# Patient Record
Sex: Female | Born: 1950 | Race: Black or African American | Hispanic: No | Smoking: Never smoker
Health system: Southern US, Community
[De-identification: ages and names within clinical notes are randomized; demographics above are authoritative.]

## PROBLEM LIST (undated history)

## (undated) DIAGNOSIS — R200 Anesthesia of skin: Secondary | ICD-10-CM

## (undated) DIAGNOSIS — Z862 Personal history of diseases of the blood and blood-forming organs and certain disorders involving the immune mechanism: Secondary | ICD-10-CM

## (undated) DIAGNOSIS — J45909 Unspecified asthma, uncomplicated: Secondary | ICD-10-CM

## (undated) DIAGNOSIS — I10 Essential (primary) hypertension: Secondary | ICD-10-CM

## (undated) DIAGNOSIS — E7800 Pure hypercholesterolemia, unspecified: Secondary | ICD-10-CM

## (undated) DIAGNOSIS — K219 Gastro-esophageal reflux disease without esophagitis: Secondary | ICD-10-CM

## (undated) DIAGNOSIS — M1990 Unspecified osteoarthritis, unspecified site: Secondary | ICD-10-CM

## (undated) DIAGNOSIS — J449 Chronic obstructive pulmonary disease, unspecified: Secondary | ICD-10-CM

## (undated) DIAGNOSIS — B181 Chronic viral hepatitis B without delta-agent: Secondary | ICD-10-CM

## (undated) HISTORY — PX: BACK SURGERY: SHX140

## (undated) HISTORY — PX: BILATERAL CARPAL TUNNEL RELEASE: SHX6508

## (undated) HISTORY — PX: KNEE SURGERY: SHX244

## (undated) HISTORY — PX: CERVICAL SPINE SURGERY: SHX589

---

## 1998-04-17 ENCOUNTER — Emergency Department (HOSPITAL_COMMUNITY): Admission: EM | Admit: 1998-04-17 | Discharge: 1998-04-17 | Payer: Self-pay | Admitting: Emergency Medicine

## 1998-05-02 ENCOUNTER — Emergency Department (HOSPITAL_COMMUNITY): Admission: EM | Admit: 1998-05-02 | Discharge: 1998-05-02 | Payer: Self-pay | Admitting: Emergency Medicine

## 1998-06-09 ENCOUNTER — Observation Stay (HOSPITAL_COMMUNITY): Admission: RE | Admit: 1998-06-09 | Discharge: 1998-06-10 | Payer: Self-pay | Admitting: Neurosurgery

## 1998-07-10 ENCOUNTER — Ambulatory Visit (HOSPITAL_COMMUNITY): Admission: RE | Admit: 1998-07-10 | Discharge: 1998-07-10 | Payer: Self-pay | Admitting: Gastroenterology

## 1998-12-10 ENCOUNTER — Encounter: Payer: Self-pay | Admitting: Emergency Medicine

## 1998-12-10 ENCOUNTER — Emergency Department (HOSPITAL_COMMUNITY): Admission: EM | Admit: 1998-12-10 | Discharge: 1998-12-10 | Payer: Self-pay | Admitting: Emergency Medicine

## 1998-12-14 ENCOUNTER — Emergency Department (HOSPITAL_COMMUNITY): Admission: EM | Admit: 1998-12-14 | Discharge: 1998-12-14 | Payer: Self-pay | Admitting: Emergency Medicine

## 1999-10-16 ENCOUNTER — Inpatient Hospital Stay (HOSPITAL_COMMUNITY): Admission: EM | Admit: 1999-10-16 | Discharge: 1999-10-19 | Payer: Self-pay | Admitting: Emergency Medicine

## 1999-10-16 ENCOUNTER — Encounter: Payer: Self-pay | Admitting: Emergency Medicine

## 1999-10-18 ENCOUNTER — Encounter: Payer: Self-pay | Admitting: Internal Medicine

## 1999-11-03 ENCOUNTER — Encounter: Admission: RE | Admit: 1999-11-03 | Discharge: 1999-11-03 | Payer: Self-pay | Admitting: Internal Medicine

## 2001-06-07 ENCOUNTER — Emergency Department (HOSPITAL_COMMUNITY): Admission: EM | Admit: 2001-06-07 | Discharge: 2001-06-07 | Payer: Self-pay | Admitting: Emergency Medicine

## 2001-07-06 ENCOUNTER — Emergency Department (HOSPITAL_COMMUNITY): Admission: EM | Admit: 2001-07-06 | Discharge: 2001-07-06 | Payer: Self-pay | Admitting: Emergency Medicine

## 2001-07-06 ENCOUNTER — Encounter: Payer: Self-pay | Admitting: Emergency Medicine

## 2001-07-27 ENCOUNTER — Encounter: Payer: Self-pay | Admitting: Neurosurgery

## 2001-07-31 ENCOUNTER — Ambulatory Visit (HOSPITAL_COMMUNITY): Admission: RE | Admit: 2001-07-31 | Discharge: 2001-08-01 | Payer: Self-pay | Admitting: Neurosurgery

## 2001-07-31 ENCOUNTER — Encounter: Payer: Self-pay | Admitting: Neurosurgery

## 2001-09-25 ENCOUNTER — Encounter: Payer: Self-pay | Admitting: Neurosurgery

## 2001-09-25 ENCOUNTER — Ambulatory Visit (HOSPITAL_COMMUNITY): Admission: RE | Admit: 2001-09-25 | Discharge: 2001-09-25 | Payer: Self-pay | Admitting: Neurosurgery

## 2003-12-08 ENCOUNTER — Emergency Department (HOSPITAL_COMMUNITY): Admission: EM | Admit: 2003-12-08 | Discharge: 2003-12-08 | Payer: Self-pay | Admitting: Emergency Medicine

## 2004-05-27 ENCOUNTER — Emergency Department (HOSPITAL_COMMUNITY): Admission: EM | Admit: 2004-05-27 | Discharge: 2004-05-27 | Payer: Self-pay | Admitting: Emergency Medicine

## 2005-09-29 ENCOUNTER — Emergency Department (HOSPITAL_COMMUNITY): Admission: EM | Admit: 2005-09-29 | Discharge: 2005-09-29 | Payer: Self-pay | Admitting: Emergency Medicine

## 2008-01-30 ENCOUNTER — Emergency Department (HOSPITAL_COMMUNITY): Admission: EM | Admit: 2008-01-30 | Discharge: 2008-01-30 | Payer: Self-pay | Admitting: Emergency Medicine

## 2010-12-25 ENCOUNTER — Emergency Department (HOSPITAL_COMMUNITY): Payer: BC Managed Care – PPO

## 2010-12-25 ENCOUNTER — Emergency Department (HOSPITAL_COMMUNITY)
Admission: EM | Admit: 2010-12-25 | Discharge: 2010-12-25 | Disposition: A | Discharge status: Home / Self Care | Payer: BC Managed Care – PPO | Attending: Emergency Medicine | Admitting: Emergency Medicine

## 2010-12-25 DIAGNOSIS — R509 Fever, unspecified: Secondary | ICD-10-CM | POA: Insufficient documentation

## 2010-12-25 DIAGNOSIS — H9209 Otalgia, unspecified ear: Secondary | ICD-10-CM | POA: Insufficient documentation

## 2010-12-25 DIAGNOSIS — B1920 Unspecified viral hepatitis C without hepatic coma: Secondary | ICD-10-CM | POA: Insufficient documentation

## 2010-12-25 DIAGNOSIS — J069 Acute upper respiratory infection, unspecified: Secondary | ICD-10-CM | POA: Insufficient documentation

## 2010-12-25 DIAGNOSIS — Z8611 Personal history of tuberculosis: Secondary | ICD-10-CM | POA: Insufficient documentation

## 2010-12-25 DIAGNOSIS — J40 Bronchitis, not specified as acute or chronic: Secondary | ICD-10-CM | POA: Insufficient documentation

## 2010-12-25 DIAGNOSIS — I10 Essential (primary) hypertension: Secondary | ICD-10-CM | POA: Insufficient documentation

## 2010-12-25 DIAGNOSIS — R059 Cough, unspecified: Secondary | ICD-10-CM | POA: Insufficient documentation

## 2010-12-25 DIAGNOSIS — R05 Cough: Secondary | ICD-10-CM | POA: Insufficient documentation

## 2011-02-25 NOTE — Discharge Summary (Signed)
[place] [place]  Patient:    Colleen [name], Colleen [name]                        MRN: [id number] Adm. Date:  10/16/99 Disc. Date: 10/19/99 Attending:  Farley Ly, M.D. Dictator:   Candie Mile, MS4 CC:         Marcelino Duster, M.D.                           Discharge Summary  CONSULTS:   Cardiology  FOLLOWUP:  Appointment in the [facility] on Wednesday, January 24, at 3 oclock p.m.  DISCHARGE DIAGNOSES: 1. Atypical chest pain, probably not cardiogenic in origin. Cardiolite stress test    negative for ischemia with an ejection fraction of 66%. 2. Headaches.  DISCHARGE MEDICATIONS:   Protonix 40 mg p.o. q.d.  PROCEDURES:  None.  STUDIES:  Cardiolite stress test on October 18, 1999.  ADMISSION HISTORY AND PHYSICAL:  Colleen [name] is a 60 year old African-American female with no significant past medical history who was brought to the emergency department by EMS secondary to complaints of chest pain which began about 3:30 n the afternoon on the day of admission.  The patient describes sitting still when she noticed the sudden onset of substernal squeezing, nonradiating chest pain which was constant.  She had some mild dyspnea and nausea but no vomiting.  She denied diaphoresis, palpitations.  Chest pain was relieved after a few seconds and one  nitroglycerin by EMS.  In the emergency department, she complained of mild chest pain.  She also described a history of similar chest pain around Christmas time of this year.  She also describes intermittent headaches for the past two weeks.  She denies fevers, chills, abdominal pain, diarrhea, melena, hematochezia, or dysuria.  PAST MEDICAL HISTORY:  Headaches.  MEDICATIONS ON ADMISSION:  None.  PHYSICAL EXAMINATION:  VITAL SIGNS:  Blood pressure 146/91, pulse 73, respirations 20, O2 saturation 99% on room air.  GENERAL:  The patient was in no apparent distress.  HEENT:  Head normocephalic,  atraumatic.  PERRLA.  EOMI.  No scleral icterus or pallor.  Oropharynx was clear.  NECK:  Supple.  No JVD, no carotid bruits appreciated.  RESPIRATORY:  Clear to auscultation bilaterally.  CARDIAC:  Regular rate and rhythm.  Normal S1 and S2.  No murmurs or gallops.  ABDOMEN:  Soft, nontender, nondistended.  Bowel sounds present.  Obese.  EXTREMITIES:  No edema, 1+ distal pulses throughout.  NEUROLOGIC:  Alert and oriented x 3.  No focal deficits.  ADMISSION LABORATORY DATA:  WBC 5.4, hemoglobin 12.4, platelets 256.  Sodium 138, potassium 3.7, chloride 105, CO2 27, BUN 10, creatinine 0.6, glucose 98, calcium 8.9, total protein 8, albumin 3.6, AST 26, ALT 14, alkaline phosphatase 90, total bilirubin 0.4.  PT 14, INR 1.2, PTT 31.  UA was negative.  CK 192, 171, 153. CK-MB 2.3, 1.9, 1.1.  Troponin I 0.04.  EKG: Normal sinus rhythm with no ischemic changes, poor R wave progression.  Chest x-ray: No acute distress.  HOSPITAL COURSE:  ATYPICAL CHEST PAIN:  Colleen [name] was admitted on October 16, 1999, after experiencing sudden onset of squeezing, nonradiating, substernal chest pain that lasted a few seconds, was associated with mild shortness of breath and nausea without vomiting and resolved spontaneously.  The patient reports a similar episode around Christmas time.  Upon arrival in the ED, the patient again  experienced similar chest pain  that was relieved by nitroglycerin.  The patient was admitted for observation and placed on Pepcid and sublingual nitroglycerin p.r.n.  Serial cardiac enzymes and fasting lipid panel were drawn.  EKG demonstrated normal sinus rhythm with poor R wave progression.  The patient denied any further episodes of chest pain while n house.  Cardiolite stress test performed October 18, 1999, demonstrated no evidence of ischemia with an ejection fraction of 66%.  Given the patients body habitus nd history of shortness of breath with climbing one  flight of stairs, the patient as not thought to be a treadmill test candidate.  Since cardiac enzymes were negative and Cardiolite test was negative for ischemia, chest pain is thought to be noncardiac in origin.  Therefore, the patient was started on Protonix 40 mg p.o. q.d. for possible reflux-induced chest pain.  The patient was discharged on October 19, 1999, and will return to clinic November 03, 1999, at 3 oclock p.m. in the [facility].  DISCHARGE LABORATORY DATA:  Fasting lipid panel: Total cholesterol 155, HDL 43, LDL 104, triglycerides 41. DD:  10/19/99 TD:  10/19/99 Job: [id number]

## 2011-02-25 NOTE — Op Note (Signed)
[place]  Patient:    Colleen [name], Colleen [name] Visit Number: [id number] MRN: [id number]          Service Type: Attending:  Julio Sicks, M.D. Dictated by:   Julio Sicks, M.D. Proc. 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  PROCEDURE:  C4-5 anterior cervical diskectomy and fusion with allograft and anterior plating.  SURGEON:  Julio Sicks, M.D.  ASSISTANT:  Reinaldo Meeker, M.D.  ANESTHESIA:  General endotracheal.  INDICATION:  Colleen [name] is a 60 year old black female with a history of neck and right upper extremity pain and paresthesias consistent with a right-sided C5 radiculopathy.  She also has some underlying symptoms of a cervical myelopathy as well.  The patient has an MRI scan which demonstrates multiple- level spondylosis with significant right paracentral disk herniation at C4-5 causing marked spinal cord and nerve root compression.  The patient has been counseled as to her options.  She has decided to proceed with a C4-5 anterior cervical diskectomy and fusion with allograft and anterior plating for hopeful relief of her symptoms.  DESCRIPTION OF PROCEDURE:  Patient taken to the operating room and placed on the operating table in the supine position.  After an adequate level of anesthesia was achieved, patient positioned supine and the neck slightly extended, held in place with Holter traction.  The patients anterior cervical region was prepped and draped sterilely.  A 10 blade was used to make a linear skin incision overlying the C4-5 interspace.  This was carried down sharply to the platysma.  Platysma was then divided vertically and dissection proceeded along the medial border of the sternocleidomastoid muscle and carotid  sheath. Trachea and esophagus mobilized and retracted toward the left.  Prevertebral fascia was stripped off the anterior spinal column.  The longus colli muscle was then elevated bilaterally using electrocautery.  Deep self-retaining retractors were placed.  Intraoperative fluoroscopy was used, and the C4-5 level was confirmed.  The disk space was then incised with a 15 blade in rectangular fashion.  A wide disk space clean-out was then achieved using the pituitary rongeurs, forward and backward-angled Karlin curettes, Kerrison rongeurs, and the high-speed drill.  All elements of the disk were removed down to the level of the posterior annulus.  Microscope was brought into the field and used throughout the remainder of the diskectomy.  Remaining elements of annulus and osteophytes removed using high-speed drill down to the level of the posterior longitudinal ligament.  Posterior longitudinal ligament was then elevated and resected in piecemeal fashion using Kerrison rongeurs.  The underlying thecal sac was identified.  A wide central decompression was then performed using Kerrison rongeurs to undercut the bodies of C4 and C5. Decompression then proceeded out to each neural foramen.  The left-sided C5 nerve root was identified proximally and widely decompressed within its foramen.  The right-sided C5 nerve root was also identified and widely decompressed throughout its foramen.  All elements of disk herniation were completely resected.  All elements of the spondylitic narrowing were completely resected.  At this point there was no evidence of any continuing compression of the thecal sac or nerve roots.  The wound was then irrigated with antibiotic solution, and Gelfoam  was placed topically for hemostasis, which was found to be good.  The disk space was then distracted and a 7 mm patellar wedge allograft was then packed into place and recessed approximately 1 mm from the anterior cortical  surface.  A 23 mm Atlantis anterior cervical plate was then placed over the C4 and C5 levels.  Two 14 mm variable screws were placed at C5.  One 14 mm variable screw was placed on the right side at C4.  Attempts to place a screw on the left side at C4 were unsuccessful.  The screw hole kept stripping.  Solid purchase could not be obtained.  Options were considered, including removing the other three screws and replacing the instrumentation versus just leaving a stand-alone plate with three screws.  As the other three screws had very good purchase and the bone graft was well-apposed to the surrounding bone graft, it was decided to just leave the three-screw construct.  All three screws were given a final tightening and found to be solidly within bone.  The locking screws were engaged.  The retractor system was removed.  Final images revealed good position of the bone graft and hardware, proper operative level, with normal alignment of the spine.  The wound was irrigated one final time.  Hemostasis was achieved with bipolar electrocautery.  The wound was then closed in typical fashion. Steri-Strips and sterile dressings were applied.  There were no apparent complications.  The patient tolerated the procedure well, and she returns to the recovery room postop. Dictated by:   Julio Sicks, M.D. Attending:  Julio Sicks, M.D. DD:  07/31/01 TD:  08/01/01 Job: [id number]

## 2011-05-25 ENCOUNTER — Emergency Department (HOSPITAL_COMMUNITY): Payer: Self-pay

## 2011-05-25 ENCOUNTER — Emergency Department (HOSPITAL_COMMUNITY)
Admission: EM | Admit: 2011-05-25 | Discharge: 2011-05-25 | Disposition: A | Discharge status: Home / Self Care | Payer: Self-pay | Attending: Emergency Medicine | Admitting: Emergency Medicine

## 2011-05-25 DIAGNOSIS — R0989 Other specified symptoms and signs involving the circulatory and respiratory systems: Secondary | ICD-10-CM | POA: Insufficient documentation

## 2011-05-25 DIAGNOSIS — R079 Chest pain, unspecified: Secondary | ICD-10-CM | POA: Insufficient documentation

## 2011-05-25 DIAGNOSIS — Z8619 Personal history of other infectious and parasitic diseases: Secondary | ICD-10-CM | POA: Insufficient documentation

## 2011-05-25 DIAGNOSIS — Z79899 Other long term (current) drug therapy: Secondary | ICD-10-CM | POA: Insufficient documentation

## 2011-05-25 DIAGNOSIS — R0602 Shortness of breath: Secondary | ICD-10-CM | POA: Insufficient documentation

## 2011-05-25 DIAGNOSIS — I10 Essential (primary) hypertension: Secondary | ICD-10-CM | POA: Insufficient documentation

## 2011-05-25 DIAGNOSIS — Z8611 Personal history of tuberculosis: Secondary | ICD-10-CM | POA: Insufficient documentation

## 2011-05-25 DIAGNOSIS — R1013 Epigastric pain: Secondary | ICD-10-CM | POA: Insufficient documentation

## 2011-05-25 DIAGNOSIS — K219 Gastro-esophageal reflux disease without esophagitis: Secondary | ICD-10-CM | POA: Insufficient documentation

## 2011-05-25 DIAGNOSIS — R0609 Other forms of dyspnea: Secondary | ICD-10-CM | POA: Insufficient documentation

## 2011-05-25 LAB — CK TOTAL AND CKMB (NOT AT ARMC)
CK, MB: 5 ng/mL — ABNORMAL HIGH (ref 0.3–4.0)
Total CK: 325 U/L — ABNORMAL HIGH (ref 7–177)

## 2011-05-25 LAB — COMPREHENSIVE METABOLIC PANEL
Albumin: 3.8 g/dL (ref 3.5–5.2)
BUN: 16 mg/dL (ref 6–23)
Creatinine, Ser: 0.49 mg/dL — ABNORMAL LOW (ref 0.50–1.10)
GFR calc Af Amer: >60 mL/min (ref >60)
Glucose, Bld: 89 mg/dL (ref 70–99)
Total Bilirubin: 0.3 mg/dL (ref 0.3–1.2)
Total Protein: 8.5 g/dL — ABNORMAL HIGH (ref 6.0–8.3)

## 2011-05-25 LAB — DIFFERENTIAL
Basophils Absolute: 0 10*3/uL (ref 0.0–0.1)
Basophils Relative: 0 % (ref 0–1)
Eosinophils Absolute: 0.1 10*3/uL (ref 0.0–0.7)
Eosinophils Relative: 1 % (ref 0–5)
Monocytes Absolute: 0.6 10*3/uL (ref 0.1–1.0)

## 2011-05-25 LAB — POCT I-STAT TROPONIN I: Troponin i, poc: 0.01 ng/mL (ref 0.00–0.08)

## 2011-05-25 LAB — TROPONIN I: Troponin I: <0.3 ng/mL (ref <0.30)

## 2011-05-25 LAB — CBC
MCHC: 34.6 g/dL (ref 30.0–36.0)
RDW: 14.6 % (ref 11.5–15.5)

## 2011-06-22 ENCOUNTER — Emergency Department (HOSPITAL_COMMUNITY): Payer: Self-pay

## 2011-06-22 ENCOUNTER — Emergency Department (HOSPITAL_COMMUNITY)
Admission: EM | Admit: 2011-06-22 | Discharge: 2011-06-22 | Disposition: A | Discharge status: Home / Self Care | Payer: Self-pay | Attending: Emergency Medicine | Admitting: Emergency Medicine

## 2011-06-22 DIAGNOSIS — Z8611 Personal history of tuberculosis: Secondary | ICD-10-CM | POA: Insufficient documentation

## 2011-06-22 DIAGNOSIS — K219 Gastro-esophageal reflux disease without esophagitis: Secondary | ICD-10-CM | POA: Insufficient documentation

## 2011-06-22 DIAGNOSIS — R599 Enlarged lymph nodes, unspecified: Secondary | ICD-10-CM | POA: Insufficient documentation

## 2011-06-22 DIAGNOSIS — R059 Cough, unspecified: Secondary | ICD-10-CM | POA: Insufficient documentation

## 2011-06-22 DIAGNOSIS — I10 Essential (primary) hypertension: Secondary | ICD-10-CM | POA: Insufficient documentation

## 2011-06-22 DIAGNOSIS — R070 Pain in throat: Secondary | ICD-10-CM | POA: Insufficient documentation

## 2011-06-22 DIAGNOSIS — J351 Hypertrophy of tonsils: Secondary | ICD-10-CM | POA: Insufficient documentation

## 2011-06-22 DIAGNOSIS — Z8619 Personal history of other infectious and parasitic diseases: Secondary | ICD-10-CM | POA: Insufficient documentation

## 2011-06-22 DIAGNOSIS — Z79899 Other long term (current) drug therapy: Secondary | ICD-10-CM | POA: Insufficient documentation

## 2011-06-22 DIAGNOSIS — J3489 Other specified disorders of nose and nasal sinuses: Secondary | ICD-10-CM | POA: Insufficient documentation

## 2011-06-22 DIAGNOSIS — J039 Acute tonsillitis, unspecified: Secondary | ICD-10-CM | POA: Insufficient documentation

## 2011-06-22 DIAGNOSIS — R05 Cough: Secondary | ICD-10-CM | POA: Insufficient documentation

## 2011-06-22 LAB — DIFFERENTIAL
Basophils Absolute: 0 10*3/uL (ref 0.0–0.1)
Basophils Relative: 0 % (ref 0–1)
Eosinophils Relative: 1 % (ref 0–5)
Lymphocytes Relative: 22 % (ref 12–46)
Neutro Abs: 7.2 10*3/uL (ref 1.7–7.7)

## 2011-06-22 LAB — BASIC METABOLIC PANEL
BUN: 10 mg/dL (ref 6–23)
Chloride: 100 mEq/L (ref 96–112)
GFR calc Af Amer: >60 mL/min (ref >60)
GFR calc non Af Amer: >60 mL/min (ref >60)
Potassium: 3.3 mEq/L — ABNORMAL LOW (ref 3.5–5.1)
Sodium: 140 mEq/L (ref 135–145)

## 2011-06-22 LAB — CBC
HCT: 35.4 % — ABNORMAL LOW (ref 36.0–46.0)
Platelets: 244 10*3/uL (ref 150–400)
RDW: 14.1 % (ref 11.5–15.5)
WBC: 10.8 10*3/uL — ABNORMAL HIGH (ref 4.0–10.5)

## 2011-06-22 LAB — RAPID STREP SCREEN (MED CTR MEBANE ONLY): Streptococcus, Group A Screen (Direct): POSITIVE — AB

## 2015-02-22 ENCOUNTER — Emergency Department (HOSPITAL_COMMUNITY)
Admission: EM | Admit: 2015-02-22 | Discharge: 2015-02-22 | Disposition: A | Discharge status: Home / Self Care | Payer: 59 | Attending: Emergency Medicine | Admitting: Emergency Medicine

## 2015-02-22 ENCOUNTER — Emergency Department (HOSPITAL_COMMUNITY): Payer: 59

## 2015-02-22 ENCOUNTER — Encounter (HOSPITAL_COMMUNITY): Payer: Self-pay | Admitting: Family Medicine

## 2015-02-22 DIAGNOSIS — Z79899 Other long term (current) drug therapy: Secondary | ICD-10-CM | POA: Insufficient documentation

## 2015-02-22 DIAGNOSIS — Z8719 Personal history of other diseases of the digestive system: Secondary | ICD-10-CM | POA: Diagnosis not present

## 2015-02-22 DIAGNOSIS — I10 Essential (primary) hypertension: Secondary | ICD-10-CM | POA: Insufficient documentation

## 2015-02-22 DIAGNOSIS — Z7982 Long term (current) use of aspirin: Secondary | ICD-10-CM | POA: Diagnosis not present

## 2015-02-22 DIAGNOSIS — J45901 Unspecified asthma with (acute) exacerbation: Secondary | ICD-10-CM | POA: Diagnosis not present

## 2015-02-22 DIAGNOSIS — R0602 Shortness of breath: Secondary | ICD-10-CM | POA: Diagnosis present

## 2015-02-22 HISTORY — DX: Essential (primary) hypertension: I10

## 2015-02-22 HISTORY — DX: Unspecified asthma, uncomplicated: J45.909

## 2015-02-22 HISTORY — DX: Gastro-esophageal reflux disease without esophagitis: K21.9

## 2015-02-22 LAB — BASIC METABOLIC PANEL
ANION GAP: 10 (ref 5–15)
BUN: 9 mg/dL (ref 6–20)
CALCIUM: 9.4 mg/dL (ref 8.9–10.3)
CHLORIDE: 100 mmol/L — AB (ref 101–111)
CO2: 28 mmol/L (ref 22–32)
Creatinine, Ser: 0.62 mg/dL (ref 0.44–1.00)
GFR calc Af Amer: >60 mL/min (ref >60)
GFR calc non Af Amer: >60 mL/min (ref >60)
Glucose, Bld: 94 mg/dL (ref 65–99)
Potassium: 3.9 mmol/L (ref 3.5–5.1)
SODIUM: 138 mmol/L (ref 135–145)

## 2015-02-22 LAB — CBC WITH DIFFERENTIAL/PLATELET
Basophils Absolute: 0 10*3/uL (ref 0.0–0.1)
Basophils Relative: 0 % (ref 0–1)
EOS ABS: 0.1 10*3/uL (ref 0.0–0.7)
Eosinophils Relative: 1 % (ref 0–5)
HEMATOCRIT: 36.4 % (ref 36.0–46.0)
HEMOGLOBIN: 12.6 g/dL (ref 12.0–15.0)
LYMPHS ABS: 3.4 10*3/uL (ref 0.7–4.0)
LYMPHS PCT: 48 % — AB (ref 12–46)
MCH: 31.8 pg (ref 26.0–34.0)
MCHC: 34.6 g/dL (ref 30.0–36.0)
MCV: 91.9 fL (ref 78.0–100.0)
MONO ABS: 0.6 10*3/uL (ref 0.1–1.0)
MONOS PCT: 9 % (ref 3–12)
NEUTROS ABS: 3 10*3/uL (ref 1.7–7.7)
Neutrophils Relative %: 42 % — ABNORMAL LOW (ref 43–77)
PLATELETS: 183 10*3/uL (ref 150–400)
RBC: 3.96 MIL/uL (ref 3.87–5.11)
RDW: 14.8 % (ref 11.5–15.5)
WBC: 7 10*3/uL (ref 4.0–10.5)

## 2015-02-22 MED ORDER — ALBUTEROL SULFATE HFA 108 (90 BASE) MCG/ACT IN AERS
2.0000 | INHALATION_SPRAY | Freq: Once | RESPIRATORY_TRACT | Status: AC
  Administered 2015-02-22: 2 via RESPIRATORY_TRACT
  Filled 2015-02-22: qty 6.7

## 2015-02-22 MED ORDER — ALBUTEROL SULFATE (2.5 MG/3ML) 0.083% IN NEBU
5.0000 mg | INHALATION_SOLUTION | Freq: Once | RESPIRATORY_TRACT | Status: AC
  Administered 2015-02-22: 5 mg via RESPIRATORY_TRACT
  Filled 2015-02-22: qty 6

## 2015-02-22 MED ORDER — PREDNISONE 20 MG PO TABS
60.0000 mg | ORAL_TABLET | Freq: Once | ORAL | Status: AC
  Administered 2015-02-22: 60 mg via ORAL
  Filled 2015-02-22: qty 3

## 2015-02-22 MED ORDER — PREDNISONE 10 MG PO TABS: 50.0000 mg | ORAL_TABLET | Freq: Every day | ORAL | Status: DC

## 2015-02-22 NOTE — ED Notes (Signed)
Pt here for asthma attack this am. Took breathing treatment without relief.

## 2015-02-22 NOTE — ED Notes (Signed)
Ambulated Patient at RA.  Patient 02 stayed above 96% walking to POD F and back.  Patient stated she felt much better.

## 2015-02-22 NOTE — ED Notes (Signed)
Pt remains monitored by blood pressure and pulse ox. Pts family remains at bedside.  

## 2015-02-22 NOTE — Discharge Instructions (Signed)

## 2015-02-22 NOTE — ED Notes (Signed)
Pt remains monitored by blood pressure and pulse ox. pts family remains at bedside.  

## 2015-02-22 NOTE — ED Provider Notes (Signed)
CSN: [id number]     Arrival date & time 02/22/15  0905 History   First MD Initiated Contact with Patient 02/22/15 0914     Chief Complaint  Patient presents with  . Asthma     (Consider location/radiation/quality/duration/timing/severity/associated sxs/prior Treatment) Patient is a 64 y.o. female presenting with shortness of breath.  Shortness of Breath Severity:  Moderate Onset quality:  Gradual Duration: a few hours. Timing:  Constant Progression:  Partially resolved Chronicity:  Recurrent Context comment:  Started to feel short of breath as she was talking to her sister Relieved by: breathing treatment. Worsened by:  Coughing Associated symptoms: cough and sputum production (yellow)   Associated symptoms: no chest pain, no diaphoresis and no fever     Past Medical History  Diagnosis Date  . Asthma   . Hypertension   . Acid reflux    History reviewed. No pertinent past surgical history. History reviewed. No pertinent family history. History  Substance Use Topics  . Smoking status: Never Smoker   . Smokeless tobacco: Not on file  . Alcohol Use: No   OB History    No data available     Review of Systems  Constitutional: Negative for fever and diaphoresis.  Respiratory: Positive for cough, sputum production (yellow) and shortness of breath.   Cardiovascular: Negative for chest pain.  All other systems reviewed and are negative.     Allergies  Shellfish allergy  Home Medications   Prior to Admission medications   Medication Sig Start Date End Date Taking? Authorizing Provider  aspirin 81 MG tablet Take 81 mg by mouth daily.   Yes Historical Provider, MD  cloNIDine (CATAPRES) 0.1 MG tablet Take 0.1 mg by mouth 2 (two) times daily.   Yes Historical Provider, MD  hydrochlorothiazide (HYDRODIURIL) 25 MG tablet Take 25 mg by mouth daily.   Yes Historical Provider, MD   BP 138/77 mmHg  Pulse 82  Temp(Src) 98.4 F (36.9 C) (Oral)  Resp 18  Wt 225 lb (102.059  kg)  SpO2 97% Physical Exam  Constitutional: She is oriented to person, place, and time. She appears well-developed and well-nourished. No distress.  HENT:  Head: Normocephalic and atraumatic.  Mouth/Throat: Oropharynx is clear and moist.  Eyes: Conjunctivae are normal. Pupils are equal, round, and reactive to light. No scleral icterus.  Neck: Neck supple.  Cardiovascular: Normal rate, regular rhythm, normal heart sounds and intact distal pulses.   No murmur heard. Pulmonary/Chest: Effort normal. No stridor. No respiratory distress. She has wheezes (diffuse, mild). She has no rales.  Abdominal: Soft. Bowel sounds are normal. She exhibits no distension. There is no tenderness.  Musculoskeletal: Normal range of motion.  Neurological: She is alert and oriented to person, place, and time.  Skin: Skin is warm and dry. No rash noted.  Psychiatric: She has a normal mood and affect. Her behavior is normal.  Nursing note and vitals reviewed.   ED Course  Procedures (including critical care time) Labs Review Labs Reviewed  CBC WITH DIFFERENTIAL/PLATELET - Abnormal; Notable for the following:    Neutrophils Relative % 42 (*)    Lymphocytes Relative 48 (*)    All other components within normal limits  BASIC METABOLIC PANEL - Abnormal; Notable for the following:    Chloride 100 (*)    All other components within normal limits    Imaging Review Dg Chest 2 View  02/22/2015   CLINICAL DATA:  Asthma attack  EXAM: CHEST  2 VIEW  COMPARISON:  06/22/2011  FINDINGS: Upper normal heart size. Clear lungs. No pneumothorax or pleural effusion. Intact thoracic spine.  IMPRESSION: No active cardiopulmonary disease.   Electronically Signed   By: Jolaine ClickArthur  Hoss M.D.   On: 02/22/2015 11:26     EKG Interpretation None      MDM   Final diagnoses:  Shortness of breath  Acute asthma exacerbation, unspecified asthma severity    64 yo female with history of asthma presenting after an episode of shortness  of breath.  Symptoms near resolved after her daughter gave her a breathing treatment.  She has had a productive cough, but on exam appears well without increased WOB or hypoxia.  CXR negative.  Remained well appearing.  Wheezing cleared after breathing treatments.  Clinical picture consistent with acute asthma exacerbation.  Not c/w PE, ACS, Pneumonia.  DC with albuterol, prednisone, follow up.     Blake DivineJohn Yasaman Kolek, MD 02/22/15 1324

## 2015-12-12 ENCOUNTER — Encounter (HOSPITAL_COMMUNITY): Payer: Self-pay

## 2015-12-12 ENCOUNTER — Emergency Department (HOSPITAL_COMMUNITY)
Admission: EM | Admit: 2015-12-12 | Discharge: 2015-12-12 | Disposition: A | Discharge status: Home / Self Care | Payer: BLUE CROSS/BLUE SHIELD | Attending: Emergency Medicine | Admitting: Emergency Medicine

## 2015-12-12 DIAGNOSIS — Z7982 Long term (current) use of aspirin: Secondary | ICD-10-CM | POA: Diagnosis not present

## 2015-12-12 DIAGNOSIS — R202 Paresthesia of skin: Secondary | ICD-10-CM | POA: Diagnosis not present

## 2015-12-12 DIAGNOSIS — E7800 Pure hypercholesterolemia, unspecified: Secondary | ICD-10-CM | POA: Insufficient documentation

## 2015-12-12 DIAGNOSIS — G629 Polyneuropathy, unspecified: Secondary | ICD-10-CM | POA: Diagnosis not present

## 2015-12-12 DIAGNOSIS — Z79899 Other long term (current) drug therapy: Secondary | ICD-10-CM | POA: Insufficient documentation

## 2015-12-12 DIAGNOSIS — Z8719 Personal history of other diseases of the digestive system: Secondary | ICD-10-CM | POA: Diagnosis not present

## 2015-12-12 DIAGNOSIS — I10 Essential (primary) hypertension: Secondary | ICD-10-CM | POA: Insufficient documentation

## 2015-12-12 DIAGNOSIS — J45909 Unspecified asthma, uncomplicated: Secondary | ICD-10-CM | POA: Insufficient documentation

## 2015-12-12 DIAGNOSIS — R200 Anesthesia of skin: Secondary | ICD-10-CM | POA: Diagnosis present

## 2015-12-12 DIAGNOSIS — Z8639 Personal history of other endocrine, nutritional and metabolic disease: Secondary | ICD-10-CM | POA: Insufficient documentation

## 2015-12-12 DIAGNOSIS — K219 Gastro-esophageal reflux disease without esophagitis: Secondary | ICD-10-CM | POA: Insufficient documentation

## 2015-12-12 HISTORY — DX: Gastro-esophageal reflux disease without esophagitis: K21.9

## 2015-12-12 HISTORY — DX: Pure hypercholesterolemia, unspecified: E78.00

## 2015-12-12 MED ORDER — PREDNISONE 20 MG PO TABS: 40.0000 mg | ORAL_TABLET | Freq: Every day | ORAL | Status: DC

## 2015-12-12 MED ORDER — METHOCARBAMOL 500 MG PO TABS: 500.0000 mg | ORAL_TABLET | Freq: Two times a day (BID) | ORAL | Status: DC

## 2015-12-12 NOTE — ED Provider Notes (Signed)
CSN: [id number]     Arrival date & time 12/12/15  1819 History   First MD Initiated Contact with Patient 12/12/15 1948     Chief Complaint  Patient presents with  . Numbness    HPI   Ms. Colleen [name] is an 65 y.o. female with history of HTN, asthma, GERD, HLD who presents to the ED for evaluation of numbness and tingling in her right hand and arms. She states she has had intermittent issues with paresthesias and arm pain for "years" and it used to only occur when she slept in a funny position. She states now the feeling is becoming more and more constant. She denies weakness but states she sometimes feels like her fingers are tingling "like pins and needles." States sometimes it feels more like a sharp pain. States she does sometmies have some right sided neck pain. Denies injury or trauma. Denies any alleviating factors.  Past Medical History  Diagnosis Date  . Asthma   . Hypertension   . Acid reflux   . GERD (gastroesophageal reflux disease)   . Hypercholesterolemia    Past Surgical History  Procedure Laterality Date  . Bilateral carpal tunnel release    . Back surgery    . Knee surgery     History reviewed. No pertinent family history. Social History  Substance Use Topics  . Smoking status: Never Smoker   . Smokeless tobacco: None  . Alcohol Use: No   OB History    No data available     Review of Systems  All other systems reviewed and are negative.     Allergies  Shellfish allergy  Home Medications   Prior to Admission medications   Medication Sig Start Date End Date Taking? Authorizing Provider  aspirin 81 MG tablet Take 81 mg by mouth daily.    Historical Provider, MD  cloNIDine (CATAPRES) 0.1 MG tablet Take 0.1 mg by mouth 2 (two) times daily.    Historical Provider, MD  hydrochlorothiazide (HYDRODIURIL) 25 MG tablet Take 25 mg by mouth daily.    Historical Provider, MD  predniSONE (DELTASONE) 10 MG tablet Take 5 tablets (50 mg total) by mouth daily. 02/23/15   Blake DivineJohn  Wofford, MD   BP 136/82 mmHg  Pulse 72  Temp(Src) 98.3 F (36.8 C) (Oral)  Resp 18  SpO2 99% Physical Exam  Constitutional: She is oriented to person, place, and time. No distress.  HENT:  Head: Atraumatic.  Right Ear: External ear normal.  Left Ear: External ear normal.  Nose: Nose normal.  FROM of neck without pain. No neck tenderness.  Eyes: Conjunctivae are normal. No scleral icterus.  Neck: Normal range of motion. Neck supple.  Cardiovascular: Normal rate and regular rhythm.   Pulmonary/Chest: Effort normal. No respiratory distress. She exhibits no tenderness.  Abdominal: Soft. She exhibits no distension. There is no tenderness.  Musculoskeletal:  Intact strength and sensation in bilateral UE and LE. Good grip strength. Negative phalen's, finklesteins, tinel's.   Neurological: She is alert and oriented to person, place, and time.  Skin: Skin is warm and dry. She is not diaphoretic.  Psychiatric: She has a normal mood and affect. Her behavior is normal.  Nursing note and vitals reviewed.   ED Course  Procedures (including critical care time) Labs Review Labs Reviewed - No data to display  Imaging Review No results found. I have personally reviewed and evaluated these images and lab results as part of my medical decision-making.   EKG Interpretation None  MDM   Final diagnoses:  Paresthesia of right arm  Neuropathy (HCC)    Given description of symptoms, nonofocal neuro exam, and chronicity of symptoms, I suspect pt has progressive neuropathy or impingement. Rx given for robaxin and few days of prednisone. Encouraged PCP and/or neurology f/u for further evaluation and management. At this time pt is nontoxic, VSS, unremarkable exam. Stable for outpatient f/u. ER return precautions given.    Carlene Coria, PA-C 12/14/15 0003  Lyndal Pulley, MD 12/14/15 [phone]

## 2015-12-12 NOTE — ED Notes (Signed)
She c/o some paresthesias of right fingers IV and V; and of left fingers I II and III.  She is in no distress.

## 2015-12-12 NOTE — Discharge Instructions (Signed)
You were seen in the ER today for evaluation of tingling and pain in your right arm and hand. I suspect you might have some nerve impingement. I will give you prescriptions for a muscle relaxer and a few days of steroids to see if this helps your symptoms.  You may continue taking aleve as needed as well. Please call neurology to schedule a follow up appointment as an outpatient. They might want to do an MRI of your neck/spine. Return to the ER for new or worsening symptoms.

## 2016-01-11 ENCOUNTER — Encounter: Payer: Self-pay | Admitting: Neurology

## 2016-01-11 ENCOUNTER — Ambulatory Visit (INDEPENDENT_AMBULATORY_CARE_PROVIDER_SITE_OTHER): Payer: BLUE CROSS/BLUE SHIELD | Admitting: Neurology

## 2016-01-11 VITALS — BP 120/80 | HR 89 | Ht 63.0 in | Wt 234.0 lb

## 2016-01-11 DIAGNOSIS — G5621 Lesion of ulnar nerve, right upper limb: Secondary | ICD-10-CM | POA: Diagnosis not present

## 2016-01-11 DIAGNOSIS — R202 Paresthesia of skin: Secondary | ICD-10-CM

## 2016-01-11 MED ORDER — GABAPENTIN 300 MG PO CAPS: 300.0000 mg | ORAL_CAPSULE | Freq: Two times a day (BID) | ORAL | Status: AC

## 2016-01-11 NOTE — Patient Instructions (Signed)
1.  NCS/EMG of the upper extremities 2.  Increase gabapentin to 300mg  twice daily 3.  Avoid over flexing your elbow 4.  Return to clinic in 3 months

## 2016-01-11 NOTE — Progress Notes (Signed)
[facility] Clinic Note - Initial Visit   Date: 01/11/2016  Wynelle FannyMayzell [name] MRN: [id number] DOB: [birth year]   Dear Dr. Clydene PughKnott:  Thank you for your kind referral of Colleen [name] for consultation of bilateral hand paresthesias. Although her history is well known to you, please allow us to reiterate it for the purpose of our medical record. The patient was accompanied to the clinic by self.   History of Present Illness: Colleen [name] is a 65 y.o. left-handed African American female with hypertension, hyperlipidemia, asthma, and GERD presenting for evaluation of bilateral hand paresthesias.    Starting around 1-2 years, she would wake up at night with her arms falling asleep.  Early March, she began having constant numbness and tingling of the right last two fingers into her medial forearm and left thumb.  She has weakness in the right hand and sometimes finds herself dropping objects.  She used to be able to shake her hands awake, but it does not help anymore.  She went to the emergency department on December 12 2014 where she was given prednisone and recommended to use wrist splint for possible CTS, but this has not provided any relief. She denies any neck pain.  Out-side paper records, electronic medical record, and images have been reviewed where available and summarized as:  XR cervical spine 05/29/2004: No acute skeletal abnormalities. Progressive degenerative disk disease and spondylosis at C5-6 and C6-7 since December, 2002. 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  CT head wo contrast 05/27/2004:  Empty sella. Otherwise, normal examination.   Past Medical History  Diagnosis Date  . Asthma   . Hypertension   . Acid reflux   . GERD (gastroesophageal reflux disease)   . Hypercholesterolemia     Past Surgical History  Procedure Laterality Date  . Bilateral carpal tunnel release    . Back surgery    . Knee surgery       Medications:    Outpatient Encounter Prescriptions as of 01/11/2016  Medication Sig Note  . albuterol (PROVENTIL HFA) 108 (90 Base) MCG/ACT inhaler  01/11/2016: Received from: [facility]  . aspirin 81 MG tablet Take 81 mg by mouth daily.   . beclomethasone (QVAR) 80 MCG/ACT inhaler Inhale into the lungs. 01[facility]11/2016: [facility]  . Cholecalciferol (VITAMIN D-1000 MAX ST) 1000 units tablet Take by mouth. 01/11/2016: [facility]  . cloNIDine (CATAPRES) 0.1 MG tablet Take 0.1 mg by mouth 2 (two) times daily.   . fexofenadine (ALLEGRA) 180 MG tablet  01[facility]11/2016: [facility]  . gabapentin (NEURONTIN) 300 MG capsule Take 300 mg by mouth. 01/11/2016: [facility]  . hydrochlorothiazide (HYDRODIURIL) 25 MG tablet Take 25 mg by mouth daily.   Marland Kitchen. lidocaine (XYLOCAINE) 5 % ointment  01/11/2016: [facility]  . losartan (COZAAR) 25 MG tablet Take 25 mg by mouth daily.   . methocarbamol (ROBAXIN) 500 MG tablet Take 1 tablet (500 mg total) by mouth 2 (two) times daily.   . mometasone (NASONEX) 50 MCG/ACT nasal spray Place into the nose. 01/11/2016: [facility]  . omeprazole (PRILOSEC) 20 MG capsule Take 20 mg by mouth. 01/11/2016: [facility]  . predniSONE (DELTASONE) 10 MG tablet Take 5 tablets (50 mg total) by mouth daily.   Marland Kitchen. tenofovir (VIREAD) 300 MG  tablet Take 300 mg by mouth. 01/11/2016: Received from: [facility]  . [DISCONTINUED] predniSONE (DELTASONE) 20 MG tablet Take 2 tablets (40 mg total) by mouth daily.    No facility-administered encounter medications on file as of 01/11/2016.     Allergies:  Allergies  Allergen Reactions  . Shellfish Allergy Shortness Of Breath and Swelling  . Penicillins Hives    Family  History: Family History  Problem Relation Age of Onset  . Cancer Mother   . Cancer Father     Social History: Social History  Substance Use Topics  . Smoking status: Never Smoker   . Smokeless tobacco: Never Used  . Alcohol Use: No   Social History   Social History Narrative   Lives with 2 of her children in a one story home.  Has 3 children.  Works as an in Biomedical engineer.      Review of Systems:  CONSTITUTIONAL: No fevers, chills, night sweats, or weight loss.   EYES: No visual changes or eye pain ENT: No hearing changes.  No history of nose bleeds.   RESPIRATORY: No cough, wheezing and shortness of breath.   CARDIOVASCULAR: Negative for chest pain, and palpitations.   GI: Negative for abdominal discomfort, blood in stools or black stools.  No recent change in bowel habits.   GU:  No history of incontinence.   MUSCLOSKELETAL: No history of joint pain or swelling.  No myalgias.   SKIN: Negative for lesions, rash, and itching.   HEMATOLOGY/ONCOLOGY: Negative for prolonged bleeding, bruising easily, and swollen nodes.  No history of cancer.   ENDOCRINE: Negative for cold or heat intolerance, polydipsia or goiter.   PSYCH:  No depression or anxiety symptoms.   NEURO: As Above.   Vital Signs:  BP 120/80 mmHg  Pulse 89  Ht  (1.6 m)  Wt 234 lb (106.142 kg)  BMI 41.46 kg/m2  SpO2 95% Pain Scale: 0 on a scale of 0-10   General Medical Exam:   General:  Well appearing, comfortable.   Eyes/ENT: see cranial nerve examination.   Neck: No masses appreciated.  Full range of motion without tenderness.  No carotid bruits. Respiratory:  Clear to auscultation, good air entry bilaterally.   Cardiac:  Regular rate and rhythm, no murmur.   Extremities:  No deformities, edema, or skin discoloration.  Skin:  No rashes or lesions.  Neurological Exam: MENTAL STATUS including orientation to time, place, person, recent and remote memory, attention span and concentration, language, and  fund of knowledge is normal.  Speech is not dysarthric.  CRANIAL NERVES: II:  No visual field defects.  Unremarkable fundi.   III-IV-VI: Pupils equal round and reactive to light.  Normal conjugate, extra-ocular eye movements in all directions of gaze.  No nystagmus.  No ptosis.   V:  Normal facial sensation.     VII:  Normal facial symmetry and movements.  No pathologic facial reflexes.  VIII:  Normal hearing and vestibular function.   IX-X:  Normal palatal movement.   XI:  Normal shoulder shrug and head rotation.   XII:  Normal tongue strength and range of motion, no deviation or fasciculation.  MOTOR:  No atrophy, fasciculations or abnormal movements.  No pronator drift.  Tone is normal.    Right Upper Extremity:    Left Upper Extremity:    Deltoid  5/5   Deltoid  5/5   Biceps  5/5   Biceps  5/5   Triceps  5/5  Triceps  5/5   Wrist extensors  5/5   Wrist extensors  5/5   Wrist flexors  5/5   Wrist flexors  5/5   Finger extensors  5/5   Finger extensors  5/5   Finger flexors  5/5   Finger flexors  5/5   Dorsal interossei  5/5   Dorsal interossei  5/5   Abductor pollicis  5/5   Abductor pollicis  5/5   Tone (Ashworth scale)  0  Tone (Ashworth scale)  0   Right Lower Extremity:    Left Lower Extremity:    Hip flexors  5/5   Hip flexors  5/5   Hip extensors  5/5   Hip extensors  5/5   Knee flexors  5/5   Knee flexors  5/5   Knee extensors  5/5   Knee extensors  5/5   Dorsiflexors  5/5   Dorsiflexors  5/5   Plantarflexors  5/5   Plantarflexors  5/5   Toe extensors  5/5   Toe extensors  5/5   Toe flexors  5/5   Toe flexors  5/5   Tone (Ashworth scale)  0  Tone (Ashworth scale)  0   MSRs:  Right                                                                 Left brachioradialis 2+  brachioradialis 2+  biceps 2+  biceps 2+  triceps 2+  triceps 2+  patellar 2+  patellar 2+  ankle jerk 2+  ankle jerk 2+  Hoffman no  Hoffman no  plantar response down  plantar response down    SENSORY:  Absent sensation to pin prick, temperature, and light touch over the ulnar distribution on the right hand.  Sensation elsewhere is grossly intact.  Tinels sign is negative at the wrist bilaterally.   COORDINATION/GAIT: Normal finger-to- nose-finger and heel-to-shin.  Intact rapid alternating movements bilaterally.  Able to rise from a chair without using arms.  Gait mildly wide-based and stable. Tandem and stressed gait intact.    IMPRESSION: 1.  Right ulnar neuropathy 2.  Left carpal tunnel syndrome vs. tensynovitis  PLAN/RECOMMENDATIONS:  1.  NCS/EMG of the upper extremities 2.  Increase gabapentin to  twice daily 3.  Avoid hyperflexion of the right elbow 4.  Continue to use left wrist splint  Return to clinic in 3 months.   The duration of this appointment visit was 40 minutes of face-to-face time with the patient.  Greater than 50% of this time was spent in counseling, explanation of diagnosis, planning of further management, and coordination of care.   Thank you for allowing me to participate in patient's care.  If I can answer any additional questions, I would be pleased to do so.    Sincerely,    Donika K. Allena Katz, DO

## 2016-01-19 ENCOUNTER — Emergency Department (HOSPITAL_COMMUNITY)
Admission: EM | Admit: 2016-01-19 | Discharge: 2016-01-19 | Disposition: A | Discharge status: Home / Self Care | Payer: BLUE CROSS/BLUE SHIELD | Attending: Emergency Medicine | Admitting: Emergency Medicine

## 2016-01-19 ENCOUNTER — Encounter (HOSPITAL_COMMUNITY): Payer: Self-pay | Admitting: Oncology

## 2016-01-19 DIAGNOSIS — Z7982 Long term (current) use of aspirin: Secondary | ICD-10-CM | POA: Diagnosis not present

## 2016-01-19 DIAGNOSIS — Z880 Allergy status to penicillin: Secondary | ICD-10-CM | POA: Diagnosis not present

## 2016-01-19 DIAGNOSIS — J029 Acute pharyngitis, unspecified: Secondary | ICD-10-CM

## 2016-01-19 DIAGNOSIS — J069 Acute upper respiratory infection, unspecified: Secondary | ICD-10-CM | POA: Diagnosis not present

## 2016-01-19 DIAGNOSIS — Z79899 Other long term (current) drug therapy: Secondary | ICD-10-CM | POA: Diagnosis not present

## 2016-01-19 DIAGNOSIS — K219 Gastro-esophageal reflux disease without esophagitis: Secondary | ICD-10-CM | POA: Diagnosis not present

## 2016-01-19 DIAGNOSIS — Z7951 Long term (current) use of inhaled steroids: Secondary | ICD-10-CM | POA: Diagnosis not present

## 2016-01-19 DIAGNOSIS — Z7952 Long term (current) use of systemic steroids: Secondary | ICD-10-CM | POA: Diagnosis not present

## 2016-01-19 DIAGNOSIS — Z8639 Personal history of other endocrine, nutritional and metabolic disease: Secondary | ICD-10-CM | POA: Diagnosis not present

## 2016-01-19 DIAGNOSIS — I10 Essential (primary) hypertension: Secondary | ICD-10-CM | POA: Insufficient documentation

## 2016-01-19 DIAGNOSIS — J45909 Unspecified asthma, uncomplicated: Secondary | ICD-10-CM | POA: Diagnosis not present

## 2016-01-19 LAB — RAPID STREP SCREEN (MED CTR MEBANE ONLY): Streptococcus, Group A Screen (Direct): NEGATIVE

## 2016-01-19 MED ORDER — SALINE SPRAY 0.65 % NA SOLN: 1.0000 | NASAL | Status: DC | PRN

## 2016-01-19 NOTE — Discharge Instructions (Signed)
Your strep test is negative.  You have a viral upper respiratory tract infection.  You can safely use saline nasal spray to help with your congestion and postnasal drip .  You can take Tylenol, ibuprofen for your discomfort (gargle with warm salt water for temporary relief )  Pharyngitis Pharyngitis is a sore throat (pharynx). There is redness, pain, and swelling of your throat. HOME CARE   Drink enough fluids to keep your pee (urine) clear or pale yellow.  Only take medicine as told by your doctor.  You may get sick again if you do not take medicine as told. Finish your medicines, even if you start to feel better.  Do not take aspirin.  Rest.  Rinse your mouth (gargle) with salt water ( tsp of salt per 1 qt of water) every 1-2 hours. This will help the pain.  If you are not at risk for choking, you can suck on hard candy or sore throat lozenges. GET HELP IF:  You have large, tender lumps on your neck.  You have a rash.  You cough up green, yellow-brown, or bloody spit. GET HELP RIGHT AWAY IF:   You have a stiff neck.  You drool or cannot swallow liquids.  You throw up (vomit) or are not able to keep medicine or liquids down.  You have very bad pain that does not go away with medicine.  You have problems breathing (not from a stuffy nose). MAKE SURE YOU:   Understand these instructions.  Will watch your condition.  Will get help right away if you are not doing well or get worse.   This information is not intended to replace advice given to you by your health care provider. Make sure you discuss any questions you have with your health care provider.   Document Released: 03/14/2008 Document Revised: 07/17/2013 Document Reviewed: 06/03/2013 Elsevier Interactive Patient Education [employer]2016 [employer] Inc[employer]

## 2016-01-19 NOTE — ED Provider Notes (Signed)
CSN: [id number]     Arrival date & time 01/19/16  0022 History   First MD Initiated Contact with Patient 01/19/16 [phone]     Chief Complaint  Patient presents with  . Flu like sx.      (Consider location/radiation/quality/duration/timing/severity/associated sxs/prior Treatment) HPI Comments: Patient presents with 5 days of URI symptoms and sore throat, stating that the sore throat is worse and now she has a burning sensation.  She does report a postnasal drip.  She has not been doing anything in particular to relieve any of her symptoms  The history is provided by the patient.    Past Medical History  Diagnosis Date  . Asthma   . Hypertension   . Acid reflux   . GERD (gastroesophageal reflux disease)   . Hypercholesterolemia    Past Surgical History  Procedure Laterality Date  . Bilateral carpal tunnel release    . Back surgery    . Knee surgery     Family History  Problem Relation Age of Onset  . Cancer Mother   . Cancer Father    Social History  Substance Use Topics  . Smoking status: Never Smoker   . Smokeless tobacco: Never Used  . Alcohol Use: No   OB History    No data available     Review of Systems  Constitutional: Negative for fever and chills.  HENT: Positive for congestion, postnasal drip and sore throat.   Respiratory: Negative for cough.   Cardiovascular: Negative for chest pain.  Gastrointestinal: Negative for abdominal pain.  Neurological: Negative for dizziness and headaches.      Allergies  Shellfish allergy and Penicillins  Home Medications   Prior to Admission medications   Medication Sig Start Date End Date Taking? Authorizing Provider  albuterol (PROVENTIL HFA) 108 (90 Base) MCG/ACT inhaler Inhale 1-2 puffs into the lungs every 4 (four) hours as needed for wheezing.  03/04/15  Yes Historical Provider, MD  aspirin 81 MG tablet Take 81 mg by mouth daily.   Yes Historical Provider, MD  beclomethasone (QVAR) 80 MCG/ACT inhaler Inhale 2  puffs into the lungs daily.  03/12/15  Yes Historical Provider, MD  Cholecalciferol (VITAMIN D-1000 MAX ST) 1000 units tablet Take 1,000 Units by mouth daily.  06/04/15  Yes Historical Provider, MD  cloNIDine (CATAPRES) 0.1 MG tablet Take 0.1 mg by mouth 2 (two) times daily.   Yes Historical Provider, MD  fexofenadine (ALLEGRA) 180 MG tablet Take 180 mg by mouth daily.  11/23/15  Yes Historical Provider, MD  gabapentin (NEURONTIN) 300 MG capsule Take 1 capsule (300 mg total) by mouth 2 (two) times daily. 01/11/16  Yes Donika K Patel, DO  hydrochlorothiazide (HYDRODIURIL) 25 MG tablet Take 25 mg by mouth daily.   Yes Historical Provider, MD  lidocaine (XYLOCAINE) 2 % solution Use as directed 20 mLs in the mouth or throat as needed for mouth pain.   Yes Historical Provider, MD  losartan (COZAAR) 25 MG tablet Take 25 mg by mouth daily.   Yes Historical Provider, MD  methocarbamol (ROBAXIN) 500 MG tablet Take 1 tablet (500 mg total) by mouth 2 (two) times daily. 12/12/15  Yes Serena Y Sam, PA-C  mometasone (NASONEX) 50 MCG/ACT nasal spray Place 2 sprays into the nose daily.  02/08/13  Yes Historical Provider, MD  omeprazole (PRILOSEC) 20 MG capsule Take 20 mg by mouth. 06/04/15  Yes Historical Provider, MD  predniSONE (DELTASONE) 10 MG tablet Take 5 tablets (50 mg total) by mouth daily.  02/23/15  Yes Blake DivineJohn Wofford, MD  tenofovir (VIREAD) 300 MG tablet Take 300 mg by mouth. 07/28/14  Yes Historical Provider, MD  sodium chloride (OCEAN) 0.65 % SOLN nasal spray Place 1 spray into both nostrils as needed for congestion. 01/19/16   Earley FavorGail April Colter, NP   BP 142/93 mmHg  Pulse 71  Temp(Src) 98.3 F (36.8 C) (Oral)  Resp 16  Ht 5\' 2"  (1.575 m)  Wt 106.142 kg  BMI 42.79 kg/m2  SpO2 98% Physical Exam  Constitutional: She appears well-nourished.  HENT:  Head: Normocephalic.  Right Ear: External ear normal.  Left Ear: External ear normal.  Mouth/Throat: No oropharyngeal exudate.  Eyes: Pupils are equal, round, and  reactive to light.  Neck: Normal range of motion.  Cardiovascular: Normal rate.   Pulmonary/Chest: Effort normal.  Musculoskeletal: Normal range of motion.  Lymphadenopathy:    She has no cervical adenopathy.  Neurological: She is alert.  Skin: Skin is warm.  Nursing note and vitals reviewed.   ED Course  Procedures (including critical care time) Labs Review Labs Reviewed  RAPID STREP SCREEN (NOT AT [facility])  CULTURE, GROUP A STREP [facility])    Imaging Review No results found. I have personally reviewed and evaluated these images and lab results as part of my medical decision-making.   EKG Interpretation None     Strep test is negative.  I suggest the patient is saline nasal spray for her congestion.  Follow-up with her PCP [facility]   Final diagnoses:  Viral pharyngitis  URI (upper respiratory infection)         Earley FavorGail Payton Moder, NP 01/19/16 [id number]  Dione Boozeavid Glick, MD 01/19/16 [phone]

## 2016-01-19 NOTE — ED Notes (Signed)
Pt c/o cough, congestion, body aches, chills since Thursday.

## 2016-01-21 LAB — CULTURE, GROUP A STREP (THRC)

## 2016-04-15 ENCOUNTER — Ambulatory Visit: Payer: BLUE CROSS/BLUE SHIELD | Admitting: Neurology

## 2016-04-15 DIAGNOSIS — Z029 Encounter for administrative examinations, unspecified: Secondary | ICD-10-CM

## 2016-11-18 ENCOUNTER — Encounter (HOSPITAL_COMMUNITY): Payer: Self-pay | Admitting: Emergency Medicine

## 2016-11-18 ENCOUNTER — Emergency Department (HOSPITAL_COMMUNITY): Payer: No Typology Code available for payment source

## 2016-11-18 ENCOUNTER — Emergency Department (HOSPITAL_COMMUNITY)
Admission: EM | Admit: 2016-11-18 | Discharge: 2016-11-18 | Disposition: A | Discharge status: Home / Self Care | Payer: No Typology Code available for payment source | Attending: Emergency Medicine | Admitting: Emergency Medicine

## 2016-11-18 DIAGNOSIS — Y92410 Unspecified street and highway as the place of occurrence of the external cause: Secondary | ICD-10-CM | POA: Insufficient documentation

## 2016-11-18 DIAGNOSIS — S39012A Strain of muscle, fascia and tendon of lower back, initial encounter: Secondary | ICD-10-CM

## 2016-11-18 DIAGNOSIS — I10 Essential (primary) hypertension: Secondary | ICD-10-CM | POA: Insufficient documentation

## 2016-11-18 DIAGNOSIS — Y999 Unspecified external cause status: Secondary | ICD-10-CM | POA: Insufficient documentation

## 2016-11-18 DIAGNOSIS — Y939 Activity, unspecified: Secondary | ICD-10-CM | POA: Insufficient documentation

## 2016-11-18 DIAGNOSIS — Z7982 Long term (current) use of aspirin: Secondary | ICD-10-CM | POA: Insufficient documentation

## 2016-11-18 DIAGNOSIS — S3992XA Unspecified injury of lower back, initial encounter: Secondary | ICD-10-CM | POA: Diagnosis present

## 2016-11-18 DIAGNOSIS — S139XXA Sprain of joints and ligaments of unspecified parts of neck, initial encounter: Secondary | ICD-10-CM | POA: Insufficient documentation

## 2016-11-18 DIAGNOSIS — S63502A Unspecified sprain of left wrist, initial encounter: Secondary | ICD-10-CM | POA: Diagnosis not present

## 2016-11-18 DIAGNOSIS — J45909 Unspecified asthma, uncomplicated: Secondary | ICD-10-CM | POA: Diagnosis not present

## 2016-11-18 MED ORDER — HYDROCODONE-ACETAMINOPHEN 5-325 MG PO TABS: 1.0000 | ORAL_TABLET | Freq: Four times a day (QID) | ORAL | 0 refills | Status: DC | PRN

## 2016-11-18 MED ORDER — METHOCARBAMOL 500 MG PO TABS: 500.0000 mg | ORAL_TABLET | Freq: Two times a day (BID) | ORAL | 0 refills | Status: DC

## 2016-11-18 MED ORDER — IBUPROFEN 600 MG PO TABS: 600.0000 mg | ORAL_TABLET | Freq: Four times a day (QID) | ORAL | 0 refills | Status: DC | PRN

## 2016-11-18 MED ORDER — HYDROCODONE-ACETAMINOPHEN 5-325 MG PO TABS
1.0000 | ORAL_TABLET | Freq: Once | ORAL | Status: AC
  Administered 2016-11-18: 1 via ORAL
  Filled 2016-11-18: qty 1

## 2016-11-18 NOTE — Discharge Instructions (Signed)
Ice and elevate the wrist. Keep the splint on 24/7. Take ibuprofen for pain, Norco for severe pain only, Robaxin for spasms. Follow-up with a family doctor if not improving in 1 week for repeat imaging of the wrist and further evaluation of your neck and back pain.
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
## 2017-05-08 ENCOUNTER — Emergency Department (HOSPITAL_COMMUNITY)
Admission: EM | Admit: 2017-05-08 | Discharge: 2017-05-08 | Disposition: A | Discharge status: Home / Self Care | Payer: BLUE CROSS/BLUE SHIELD | Attending: Emergency Medicine | Admitting: Emergency Medicine

## 2017-05-08 ENCOUNTER — Encounter (HOSPITAL_COMMUNITY): Payer: Self-pay | Admitting: Emergency Medicine

## 2017-05-08 DIAGNOSIS — L237 Allergic contact dermatitis due to plants, except food: Secondary | ICD-10-CM | POA: Diagnosis present

## 2017-05-08 DIAGNOSIS — I10 Essential (primary) hypertension: Secondary | ICD-10-CM | POA: Diagnosis not present

## 2017-05-08 DIAGNOSIS — Z7982 Long term (current) use of aspirin: Secondary | ICD-10-CM | POA: Insufficient documentation

## 2017-05-08 DIAGNOSIS — Z91013 Allergy to seafood: Secondary | ICD-10-CM | POA: Diagnosis not present

## 2017-05-08 DIAGNOSIS — Z880 Allergy status to penicillin: Secondary | ICD-10-CM | POA: Diagnosis not present

## 2017-05-08 DIAGNOSIS — L249 Irritant contact dermatitis, unspecified cause: Secondary | ICD-10-CM | POA: Diagnosis not present

## 2017-05-08 DIAGNOSIS — Z79899 Other long term (current) drug therapy: Secondary | ICD-10-CM | POA: Diagnosis not present

## 2017-05-08 MED ORDER — DEXAMETHASONE SODIUM PHOSPHATE 10 MG/ML IJ SOLN
10.0000 mg | Freq: Once | INTRAMUSCULAR | Status: AC
  Administered 2017-05-08: 10 mg via INTRAMUSCULAR
  Filled 2017-05-08: qty 1

## 2017-05-08 MED ORDER — DIPHENHYDRAMINE HCL 25 MG PO CAPS: 25.0000 mg | ORAL_CAPSULE | Freq: Four times a day (QID) | ORAL | 0 refills | Status: DC | PRN

## 2017-05-08 MED ORDER — PREDNISONE 20 MG PO TABS: ORAL_TABLET | ORAL | 0 refills | Status: DC

## 2017-05-08 NOTE — ED Triage Notes (Signed)
Reports rash to back of left leg.  Sure its poison oak.  Reports being allergic and that it will get bad fast.

## 2017-05-08 NOTE — ED Provider Notes (Signed)
[facility]-EMERGENCY DEPT Provider Note   CSN: [id number] Arrival date & time: 05/08/17  0616     History   Chief Complaint Chief Complaint  Patient presents with  . Poison Ivy    HPI Chancie Windle Guard is a 66 y.o. female.  Patient presents with complaint of itching to her left posterior calf starting early this morning. Patient states that she was recently at a funeral and was standing and some poison ivy. She denies any tongue swelling, difficulty breathing, vomiting. No treatments prior to arrival other than applying topical rubbing alcohol and bacitracin. Onset of symptoms acute. Course is worsening. Nothing makes symptoms better or worse. For past symptoms she states she was given a shot and pills which made it better. No history of DM.       Past Medical History:  Diagnosis Date  . Acid reflux   . Asthma   . GERD (gastroesophageal reflux disease)   . Hypercholesterolemia   . Hypertension     Patient Active Problem List   Diagnosis Date Noted  . GERD (gastroesophageal reflux disease)   . Hypercholesterolemia     Past Surgical History:  Procedure Laterality Date  . BACK SURGERY    . BILATERAL CARPAL TUNNEL RELEASE    . KNEE SURGERY      OB History    No data available       Home Medications    Prior to Admission medications   Medication Sig Start Date End Date Taking? Authorizing Provider  albuterol (PROVENTIL HFA) 108 (90 Base) MCG/ACT inhaler Inhale 1-2 puffs into the lungs every 4 (four) hours as needed for wheezing.  03/04/15   [provider]  aspirin 81 MG tablet Take 81 mg by mouth daily.    [provider]  beclomethasone (QVAR) 80 MCG/ACT inhaler Inhale 2 puffs into the lungs daily.  03/12/15   [provider]  Cholecalciferol (VITAMIN D-1000 MAX ST) 1000 units tablet Take 1,000 Units by mouth daily.  06/04/15   [provider]  cloNIDine (CATAPRES) 0.1 MG tablet Take 0.1 mg by mouth 2 (two) times daily.    [provider]  diphenhydrAMINE (BENADRYL) 25 mg capsule Take 1 capsule (25 mg total) by mouth every 6 (six) hours as needed. 05/08/17   Renne Crigler, PA-C  fexofenadine (ALLEGRA) 180 MG tablet Take 180 mg by mouth daily.  11/23/15   [provider]  gabapentin (NEURONTIN) 300 MG capsule Take 1 capsule (300 mg total) by mouth 2 (two) times daily. 01/11/16   Patel, Roxana Hires K, DO  hydrochlorothiazide (HYDRODIURIL) 25 MG tablet Take 25 mg by mouth daily.    [provider]  HYDROcodone-acetaminophen (NORCO) 5-325 MG tablet Take 1 tablet by mouth every 6 (six) hours as needed for moderate pain. 11/18/16   Kirichenko, Tatyana, PA-C  ibuprofen (ADVIL,MOTRIN) 600 MG tablet Take 1 tablet (600 mg total) by mouth every 6 (six) hours as needed. 11/18/16   Kirichenko, Tatyana, PA-C  lidocaine (XYLOCAINE) 2 % solution Use as directed 20 mLs in the mouth or throat as needed for mouth pain.    [provider]  losartan (COZAAR) 25 MG tablet Take 25 mg by mouth daily.    [provider]  methocarbamol (ROBAXIN) 500 MG tablet Take 1 tablet (500 mg total) by mouth 2 (two) times daily. 11/18/16   Kirichenko, Tatyana, PA-C  mometasone (NASONEX) 50 MCG/ACT nasal spray Place 2 sprays into the nose daily.  02/08/13   [provider]  omeprazole (  PRILOSEC) 20 MG capsule Take 20 mg by mouth. 06/04/15   [provider]  predniSONE (DELTASONE) 20 MG tablet 3 Tabs PO Days 1-3, then 2 tabs PO Days 4-6, then 1 tab PO Day 7-9, then Half Tab PO Day 10-12 05/08/17   Renne CriglerGeiple, Mervil Wacker, PA-C  sodium chloride (OCEAN) 0.65 % SOLN nasal spray Place 1 spray into both nostrils as needed for congestion. 01/19/16   Earley FavorSchulz, Gail, NP  tenofovir Stevie Kern(VIREAD) 300 MG tablet Take 300 mg by mouth. 07/28/14   [provider]    Family History Family History  Problem Relation Age of Onset  . Cancer Mother   . Cancer Father     Social History Social History  Substance Use Topics  . Smoking status:  Never Smoker  . Smokeless tobacco: Never Used  . Alcohol use No     Allergies   Shellfish allergy and Penicillins   Review of Systems Review of Systems  Constitutional: Negative for fever.  HENT: Negative for facial swelling and trouble swallowing.   Eyes: Negative for redness.  Respiratory: Negative for shortness of breath, wheezing and stridor.   Cardiovascular: Negative for chest pain.  Gastrointestinal: Negative for nausea and vomiting.  Musculoskeletal: Negative for myalgias.  Skin: Positive for color change and rash.  Neurological: Negative for light-headedness.  Psychiatric/Behavioral: Negative for confusion.     Physical Exam Updated Vital Signs BP (!) 141/90 (BP Location: Right Arm)   Pulse 70   Temp 97.8 F (36.6 C) (Oral)   Resp 18   Ht 5\' 2"  (1.575 m)   Wt 104.8 kg (231 lb)   SpO2 99%   BMI 42.25 kg/m   Physical Exam  Constitutional: She appears well-developed and well-nourished.  HENT:  Head: Normocephalic and atraumatic.  Eyes: Conjunctivae are normal.  Neck: Normal range of motion. Neck supple.  Pulmonary/Chest: No respiratory distress.  Neurological: She is alert.  Skin: Skin is warm and dry.  Pt with developing wheals noted L posteror calf over an area of 3-4 cm diameter. No cellulitis or palpable abscess.   Psychiatric: She has a normal mood and affect.  Nursing note and vitals reviewed.    ED Treatments / Results   Procedures Procedures (including critical care time)  Medications Ordered in ED Medications  dexamethasone (DECADRON) injection 10 mg (not administered)     Initial Impression / Assessment and Plan / ED Course  I have reviewed the triage vital signs and the nursing notes.  Pertinent labs & imaging results that were available during my care of the patient were reviewed by me and considered in my medical decision making (see chart for details).     Patient seen and examined. She requests similar treatment to past  because her symptoms become very bad. IM decadron here with PO prednisone to suppress rebound. Recc benadryl at night/claritin during day prn itching.   Vital signs reviewed and are as follows: BP (!) 141/90 (BP Location: Right Arm)   Pulse 70   Temp 97.8 F (36.6 C) (Oral)   Resp 18   Ht 5\' 2"  (1.575 m)   Wt 104.8 kg (231 lb)   SpO2 99%   BMI 42.25 kg/m     Final Clinical Impressions(s) / ED Diagnoses   Final diagnoses:  Irritant contact dermatitis, unspecified trigger   Patient with skin irritation, patient with poison ivy exposure. Will treat appropriately -- insect bites also possible.   New Prescriptions New Prescriptions   DIPHENHYDRAMINE (BENADRYL) 25 MG CAPSULE  Take 1 capsule (25 mg total) by mouth every 6 (six) hours as needed.   PREDNISONE (DELTASONE) 20 MG TABLET    3 Tabs PO Days 1-3, then 2 tabs PO Days 4-6, then 1 tab PO Day 7-9, then Half Tab PO Day 10-12     Renne CriglerGeiple, Yzabella Crunk, PA-C 05/08/17 [id number]    Glynn Octaveancour, Stephen, MD 05/08/17 [phone]

## 2017-05-08 NOTE — Discharge Instructions (Signed)
Please read and follow all provided instructions.  Your diagnoses today include:  1. Irritant contact dermatitis, unspecified trigger     Tests performed today include:  Vital signs. See below for your results today.   Medications prescribed:   Prednisone - steroid medicine   It is best to take this medication in the morning to prevent sleeping problems. If you are diabetic, monitor your blood sugar closely and stop taking Prednisone if blood sugar is over 300. Take with food to prevent stomach upset.    Benadryl (diphenhydramine) - antihistamine  You can find this medication over-the-counter.   DO NOT exceed:   50mg  Benadryl every 6 hours    Benadryl will make you drowsy. DO NOT drive or perform any activities that require you to be awake and alert if taking this.   Take any prescribed medications only as directed.  Home care instructions:   Follow any educational materials contained in this packet  Follow-up instructions: Please follow-up with your primary care provider in the next 3 days for further evaluation of your symptoms.   Return instructions:   Please return to the Emergency Department if you experience worsening symptoms.   Call 9-1-1 immediately if you have an allergic reaction that involves your lips, mouth, throat or if you have any difficulty breathing. This is a life-threatening emergency.   Please return if you have any other emergent concerns.  Additional Information:  Your vital signs today were: BP (!) 141/90 (BP Location: Right Arm)    Pulse 70    Temp 97.8 F (36.6 C) (Oral)    Resp 18    Ht 5\' 2"  (1.575 m)    Wt 104.8 kg (231 lb)    SpO2 99%    BMI 42.25 kg/m  If your blood pressure (BP) was elevated above 135/85 this visit, please have this repeated by your doctor within one month. --------------

## 2017-11-18 ENCOUNTER — Encounter (HOSPITAL_COMMUNITY): Payer: Self-pay

## 2017-11-18 ENCOUNTER — Other Ambulatory Visit: Payer: Self-pay

## 2017-11-18 ENCOUNTER — Ambulatory Visit (HOSPITAL_COMMUNITY)
Admission: EM | Admit: 2017-11-18 | Discharge: 2017-11-18 | Disposition: A | Discharge status: Home / Self Care | Payer: Medicare Other | Attending: Physician Assistant | Admitting: Physician Assistant

## 2017-11-18 DIAGNOSIS — L299 Pruritus, unspecified: Secondary | ICD-10-CM

## 2017-11-18 DIAGNOSIS — I10 Essential (primary) hypertension: Secondary | ICD-10-CM

## 2017-11-18 DIAGNOSIS — B356 Tinea cruris: Secondary | ICD-10-CM | POA: Diagnosis not present

## 2017-11-18 MED ORDER — ECONAZOLE NITRATE 1 % EX CREA: TOPICAL_CREAM | Freq: Two times a day (BID) | CUTANEOUS | 0 refills | Status: AC

## 2017-11-18 MED ORDER — HYDROXYZINE HCL 25 MG PO TABS: ORAL_TABLET | ORAL | 0 refills | Status: DC

## 2017-11-18 NOTE — Discharge Instructions (Signed)
This is either fungal or yeast infection of the skin. It looks more fungal. This medication will treat for both. Apply to clean and very dry skin twice a day max 2 weeks. Keep this area clean and dry at all times. The hydroxyzine will help with itching but may make you sleepy. Use cautiously. Feel better. FU here as needed.  Also your BP is high today. Make sure you keep good f/u with your PCP for elevated Blood pressure. Thanks.

## 2017-11-18 NOTE — ED Triage Notes (Addendum)
[facility] area x[facility] weeks, pt has been using OTC medications but has no relief , pt complains of rash burning and itching

## 2017-11-18 NOTE — ED Provider Notes (Signed)
[facility]    CSN: [id number] Arrival date & time: 11/18/17  1627     History   Chief Complaint Chief Complaint  Patient presents with  . Rash    HPI Juanice Windle Guardotts is a 67 y.o. female.     Presents with a pruritic rash to her groin region and perianal region. Onset x 2-3 weeks with worsening. It began in the groin and now spread. No known exposures. No vaginal involvement or discharge. She is not sexually active. No fever or chills.       Past Medical History:  Diagnosis Date  . Acid reflux   . Asthma   . GERD (gastroesophageal reflux disease)   . Hypercholesterolemia   . Hypertension     Patient Active Problem List   Diagnosis Date Noted  . GERD (gastroesophageal reflux disease)   . Hypercholesterolemia     Past Surgical History:  Procedure Laterality Date  . BACK SURGERY    . BILATERAL CARPAL TUNNEL RELEASE    . KNEE SURGERY      OB History    No data available       Home Medications    Prior to Admission medications   Medication Sig Start Date End Date Taking? Authorizing Provider  albuterol (PROVENTIL HFA) 108 (90 Base) MCG/ACT inhaler Inhale 1-2 puffs into the lungs every 4 (four) hours as needed for wheezing.  03/04/15  Yes [provider]  aspirin 81 MG tablet Take 81 mg by mouth daily.   Yes [provider]  beclomethasone (QVAR) 80 MCG/ACT inhaler Inhale 2 puffs into the lungs daily.  03/12/15  Yes [provider]  Cholecalciferol (VITAMIN D-1000 MAX ST) 1000 units tablet Take 1,000 Units by mouth daily.  06/04/15  Yes [provider]  cloNIDine (CATAPRES) 0.1 MG tablet Take 0.1 mg by mouth 2 (two) times daily.   Yes [provider]  diphenhydrAMINE (BENADRYL) 25 mg capsule Take 1 capsule (25 mg total) by mouth every 6 (six) hours as needed. 05/08/17  Yes Renne CriglerGeiple, Joshua, PA-C  fexofenadine (ALLEGRA) 180 MG tablet Take 180 mg by mouth daily.  11/23/15  Yes [provider]  gabapentin  (NEURONTIN) 300 MG capsule Take 1 capsule (300 mg total) by mouth 2 (two) times daily. 01/11/16  Yes Patel, Donika K, DO  hydrochlorothiazide (HYDRODIURIL) 25 MG tablet Take 25 mg by mouth daily.   Yes [provider]  HYDROcodone-acetaminophen (NORCO) 5-325 MG tablet Take 1 tablet by mouth every 6 (six) hours as needed for moderate pain. 11/18/16  Yes Kirichenko, Tatyana, PA-C  ibuprofen (ADVIL,MOTRIN) 600 MG tablet Take 1 tablet (600 mg total) by mouth every 6 (six) hours as needed. 11/18/16  Yes Kirichenko, Tatyana, PA-C  lidocaine (XYLOCAINE) 2 % solution Use as directed 20 mLs in the mouth or throat as needed for mouth pain.   Yes [provider]  losartan (COZAAR) 25 MG tablet Take 25 mg by mouth daily.   Yes [provider]  methocarbamol (ROBAXIN) 500 MG tablet Take 1 tablet (500 mg total) by mouth 2 (two) times daily. 11/18/16  Yes Kirichenko, Tatyana, PA-C  mometasone (NASONEX) 50 MCG/ACT nasal spray Place 2 sprays into the nose daily.  02/08/13  Yes [provider]  omeprazole (PRILOSEC) 20 MG capsule Take 20 mg by mouth. 06/04/15  Yes [provider]  predniSONE (DELTASONE) 20 MG tablet 3 Tabs PO Days 1-3, then 2 tabs PO Days 4-6, then 1 tab PO Day 7-9, then Half  Tab PO Day 10-12 05/08/17  Yes Renne Crigler, PA-C  sodium chloride (OCEAN) 0.65 % SOLN nasal spray Place 1 spray into both nostrils as needed for congestion. 01/19/16  Yes Earley Favor, NP  tenofovir Stevie Kern) 300 MG tablet Take 300 mg by mouth. 07/28/14  Yes [provider]    Family History Family History  Problem Relation Age of Onset  . Cancer Mother   . Cancer Father     Social History Social History   Tobacco Use  . Smoking status: Never Smoker  . Smokeless tobacco: Never Used  Substance Use Topics  . Alcohol use: No    Alcohol/week: 0.0 oz  . Drug use: No     Allergies   Shellfish allergy and Penicillins   Review of Systems Review of Systems  All other  systems reviewed and are negative.    Physical Exam Triage Vital Signs ED Triage Vitals [11/18/17 1741]  Enc Vitals Group     BP (!) 172/98     Pulse Rate 78     Resp 16     Temp 98 F (36.7 C)     Temp Source Oral     SpO2 99 %     Weight      Height      Head Circumference      Peak Flow      Pain Score      Pain Loc      Pain Edu?      Excl. in GC?    No data found.  Updated Vital Signs BP (!) 172/98 (BP Location: Left Arm)   Pulse 78   Temp 98 F (36.7 C) (Oral)   Resp 16   SpO2 99%   Visual Acuity Right Eye Distance:   Left Eye Distance:   Bilateral Distance:    Right Eye Near:   Left Eye Near:    Bilateral Near:     Physical Exam  Constitutional: She is oriented to person, place, and time. She appears well-developed and well-nourished.  Neurological: She is alert and oriented to person, place, and time.  Skin: Skin is warm and dry. Rash noted.  Marginated and well demarcated erythematous rash to groin regions and perianal region  Psychiatric: Her behavior is normal.  Nursing note and vitals reviewed.    UC Treatments / Results  Labs (all labs ordered are listed, but only abnormal results are displayed) Labs Reviewed - No data to display  EKG  EKG Interpretation None       Radiology No results found.  Procedures Procedures (including critical care time)  Medications Ordered in UC Medications - No data to display   Initial Impression / Assessment and Plan / UC Course  I have reviewed the triage vital signs and the nursing notes.  Pertinent labs & imaging results that were available during my care of the patient were reviewed by me and considered in my medical decision making (see chart for details).     Treat for Tinea and candida in this presentation given the severity. Econazole will treat tinea and candida at a twice daily dose. She is also provided atarax for pruritis. Education is given regarding keeping areas clean and dry.  Also keep f/u with PCP regarding elevated BP.   Final Clinical Impressions(s) / UC Diagnoses   Final diagnoses:  None    ED Discharge Orders    None       Controlled Substance Prescriptions [place] Controlled Substance Registry consulted? Not  Applicable   Riki Sheer, PA-C 11/18/17 1806

## 2019-05-09 ENCOUNTER — Telehealth: Payer: Self-pay

## 2019-05-09 ENCOUNTER — Ambulatory Visit: Payer: Self-pay | Admitting: Physician Assistant

## 2019-05-09 NOTE — H&P (Signed)
TOTAL KNEE ADMISSION H&P  Patient is being admitted for left total knee arthroplasty.  Subjective:  Chief Complaint:left knee pain.  HPI: Colleen [name], 68 y.o. female, has a history of pain and functional disability in the left knee due to arthritis and has failed non-surgical conservative treatments for greater than 12 weeks to includeNSAID's and/or analgesics, corticosteriod injections and activity modification.  Onset of symptoms was gradual, starting 6 years ago with gradually worsening course since that time. The patient noted no past surgery on the left knee(s).  Patient currently rates pain in the left knee(s) at 9 out of 10 with activity. Patient has night pain, worsening of pain with activity and weight bearing, pain that interferes with activities of daily living, pain with passive range of motion, crepitus and joint swelling.  Patient has evidence of periarticular osteophytes and joint space narrowing by imaging studies.  There is no active infection.  Patient Active Problem List   Diagnosis Date Noted  . GERD (gastroesophageal reflux disease)   . Hypercholesterolemia    Past Medical History:  Diagnosis Date  . Acid reflux   . Asthma   . GERD (gastroesophageal reflux disease)   . Hypercholesterolemia   . Hypertension     Past Surgical History:  Procedure Laterality Date  . BACK SURGERY    . BILATERAL CARPAL TUNNEL RELEASE    . KNEE SURGERY      Current Outpatient Medications  Medication Sig Dispense Refill Last Dose  . albuterol (PROVENTIL HFA) 108 (90 Base) MCG/ACT inhaler Inhale 2 puffs into the lungs every 4 (four) hours as needed for wheezing or shortness of breath.    11/17/2017 at Unknown time  . amLODipine (NORVASC) 10 MG tablet Take 10 mg by mouth daily.     Marland Kitchen. aspirin 81 MG tablet Take 81 mg by mouth daily.   11/17/2017 at Unknown time  . beclomethasone (QVAR) 80 MCG/ACT inhaler Inhale 2 puffs into the lungs daily.    11/17/2017 at Unknown time  . entecavir  (BARACLUDE) 0.5 MG tablet Take 0.5 mg by mouth daily.     Marland Kitchen. gabapentin (NEURONTIN) 300 MG capsule Take 1 capsule (300 mg total) by mouth 2 (two) times daily. (Patient taking differently: Take 300 mg by mouth at bedtime. ) 60 capsule 5 11/17/2017 at Unknown time  . ibuprofen (ADVIL) 200 MG tablet Take 200 mg by mouth every 6 (six) hours as needed for headache or moderate pain.     . Multiple Vitamins-Minerals (CENTRUM SILVER PO) Take 1 tablet by mouth daily.     Marland Kitchen. omeprazole (PRILOSEC) 20 MG capsule Take 20 mg by mouth daily.    11/17/2017 at Unknown time  . traMADol (ULTRAM) 50 MG tablet Take 50 mg by mouth every 8 (eight) hours as needed for moderate pain.     . vitamin E 100 UNIT capsule Take 100 Units by mouth daily.      No current facility-administered medications for this visit.    Allergies  Allergen Reactions  . Shellfish Allergy Shortness Of Breath and Swelling  . Penicillins Hives, Swelling and Other (See Comments)    Did it involve swelling of the face/tongue/throat, SOB, or low BP? Yes Did it involve sudden or severe rash/hives, skin peeling, or any reaction on the inside of your mouth or nose? Yes Did you need to seek medical attention at a hospital or doctor's office? Yes When did it last happen? Late 20's If all above answers are "NO", may proceed with cephalosporin use.  Social History   Tobacco Use  . Smoking status: Never Smoker  . Smokeless tobacco: Never Used  Substance Use Topics  . Alcohol use: No    Alcohol/week: 0.0 standard drinks    Family History  Problem Relation Age of Onset  . Cancer Mother   . Cancer Father      Review of Systems  Musculoskeletal: Positive for joint pain.  All other systems reviewed and are negative.   Objective:  Physical Exam  Constitutional: She is oriented to person, place, and time. She appears well-developed and well-nourished. No distress.  HENT:  Head: Normocephalic and atraumatic.  Nose: Nose normal.  Eyes: Pupils  are equal, round, and reactive to light. Conjunctivae and EOM are normal.  Neck: Normal range of motion. Neck supple.  Cardiovascular: Normal rate, regular rhythm, normal heart sounds and intact distal pulses.  Respiratory: Effort normal and breath sounds normal. No respiratory distress. She has no wheezes.  GI: Soft. Bowel sounds are normal. She exhibits no distension. There is no abdominal tenderness.  Musculoskeletal:     Left knee: She exhibits decreased range of motion, swelling and bony tenderness. She exhibits no effusion, no erythema, no LCL laxity and no MCL laxity. Tenderness found.  Lymphadenopathy:    She has no cervical adenopathy.  Neurological: She is alert and oriented to person, place, and time. No cranial nerve deficit.  Skin: Skin is warm and dry. No rash noted. No erythema.  Psychiatric: She has a normal mood and affect. Her behavior is normal.    Vital signs in last 24 hours: @VSRANGES @  Labs:   Estimated body mass index is 42.25 kg/m as calculated from the following:   Height as of 05/08/17: 5\' 2"  (1.575 m).   Weight as of 05/08/17: 104.8 kg.   Imaging Review Plain radiographs demonstrate moderate degenerative joint disease of the left knee(s). The overall alignment ismild varus. The bone quality appears to be good for age and reported activity level.      Assessment/Plan:  End stage arthritis, left knee   The patient history, physical examination, clinical judgment of the provider and imaging studies are consistent with end stage degenerative joint disease of the left knee(s) and total knee arthroplasty is deemed medically necessary. The treatment options including medical management, injection therapy arthroscopy and arthroplasty were discussed at length. The risks and benefits of total knee arthroplasty were presented and reviewed. The risks due to aseptic loosening, infection, stiffness, patella tracking problems, thromboembolic complications and other  imponderables were discussed. The patient acknowledged the explanation, agreed to proceed with the plan and consent was signed. Patient is being admitted for inpatient treatment for surgery, pain control, PT, OT, prophylactic antibiotics, VTE prophylaxis, progressive ambulation and ADL's and discharge planning. The patient is planning to be discharged home with home health services    Anticipated LOS equal to or greater than 2 midnights due to - Age 21 and older with one or more of the following:  - Obesity  - Expected need for hospital services (PT, OT, Nursing) required for safe  discharge  - Anticipated need for postoperative skilled nursing care or inpatient rehab  - Active co-morbidities: None OR   - Unanticipated findings during/Post Surgery: None  - Patient is a high risk of re-admission due to: None

## 2019-05-09 NOTE — Telephone Encounter (Signed)
NOT NEEDED

## 2019-05-09 NOTE — H&P (View-Only) (Signed)
TOTAL KNEE ADMISSION H&P  Patient is being admitted for left total knee arthroplasty.  Subjective:  Chief Complaint:left knee pain.  HPI: Colleen [name], 68 y.o. female, has a history of pain and functional disability in the left knee due to arthritis and has failed non-surgical conservative treatments for greater than 12 weeks to includeNSAID's and/or analgesics, corticosteriod injections and activity modification.  Onset of symptoms was gradual, starting 6 years ago with gradually worsening course since that time. The patient noted no past surgery on the left knee(s).  Patient currently rates pain in the left knee(s) at 9 out of 10 with activity. Patient has night pain, worsening of pain with activity and weight bearing, pain that interferes with activities of daily living, pain with passive range of motion, crepitus and joint swelling.  Patient has evidence of periarticular osteophytes and joint space narrowing by imaging studies.  There is no active infection.  Patient Active Problem List   Diagnosis Date Noted  . GERD (gastroesophageal reflux disease)   . Hypercholesterolemia    Past Medical History:  Diagnosis Date  . Acid reflux   . Asthma   . GERD (gastroesophageal reflux disease)   . Hypercholesterolemia   . Hypertension     Past Surgical History:  Procedure Laterality Date  . BACK SURGERY    . BILATERAL CARPAL TUNNEL RELEASE    . KNEE SURGERY      Current Outpatient Medications  Medication Sig Dispense Refill Last Dose  . albuterol (PROVENTIL HFA) 108 (90 Base) MCG/ACT inhaler Inhale 2 puffs into the lungs every 4 (four) hours as needed for wheezing or shortness of breath.    11/17/2017 at Unknown time  . amLODipine (NORVASC) 10 MG tablet Take 10 mg by mouth daily.     . aspirin 81 MG tablet Take 81 mg by mouth daily.   11/17/2017 at Unknown time  . beclomethasone (QVAR) 80 MCG/ACT inhaler Inhale 2 puffs into the lungs daily.    11/17/2017 at Unknown time  . entecavir  (BARACLUDE) 0.5 MG tablet Take 0.5 mg by mouth daily.     . gabapentin (NEURONTIN) 300 MG capsule Take 1 capsule (300 mg total) by mouth 2 (two) times daily. (Patient taking differently: Take 300 mg by mouth at bedtime. ) 60 capsule 5 11/17/2017 at Unknown time  . ibuprofen (ADVIL) 200 MG tablet Take 200 mg by mouth every 6 (six) hours as needed for headache or moderate pain.     . Multiple Vitamins-Minerals (CENTRUM SILVER PO) Take 1 tablet by mouth daily.     . omeprazole (PRILOSEC) 20 MG capsule Take 20 mg by mouth daily.    11/17/2017 at Unknown time  . traMADol (ULTRAM) 50 MG tablet Take 50 mg by mouth every 8 (eight) hours as needed for moderate pain.     . vitamin E 100 UNIT capsule Take 100 Units by mouth daily.      No current facility-administered medications for this visit.    Allergies  Allergen Reactions  . Shellfish Allergy Shortness Of Breath and Swelling  . Penicillins Hives, Swelling and Other (See Comments)    Did it involve swelling of the face/tongue/throat, SOB, or low BP? Yes Did it involve sudden or severe rash/hives, skin peeling, or any reaction on the inside of your mouth or nose? Yes Did you need to seek medical attention at a hospital or doctor's office? Yes When did it last happen? Late 20's If all above answers are "NO", may proceed with cephalosporin use.       Social History   Tobacco Use  . Smoking status: Never Smoker  . Smokeless tobacco: Never Used  Substance Use Topics  . Alcohol use: No    Alcohol/week: 0.0 standard drinks    Family History  Problem Relation Age of Onset  . Cancer Mother   . Cancer Father      Review of Systems  Musculoskeletal: Positive for joint pain.  All other systems reviewed and are negative.   Objective:  Physical Exam  Constitutional: She is oriented to person, place, and time. She appears well-developed and well-nourished. No distress.  HENT:  Head: Normocephalic and atraumatic.  Nose: Nose normal.  Eyes: Pupils  are equal, round, and reactive to light. Conjunctivae and EOM are normal.  Neck: Normal range of motion. Neck supple.  Cardiovascular: Normal rate, regular rhythm, normal heart sounds and intact distal pulses.  Respiratory: Effort normal and breath sounds normal. No respiratory distress. She has no wheezes.  GI: Soft. Bowel sounds are normal. She exhibits no distension. There is no abdominal tenderness.  Musculoskeletal:     Left knee: She exhibits decreased range of motion, swelling and bony tenderness. She exhibits no effusion, no erythema, no LCL laxity and no MCL laxity. Tenderness found.  Lymphadenopathy:    She has no cervical adenopathy.  Neurological: She is alert and oriented to person, place, and time. No cranial nerve deficit.  Skin: Skin is warm and dry. No rash noted. No erythema.  Psychiatric: She has a normal mood and affect. Her behavior is normal.    Vital signs in last 24 hours: @VSRANGES @  Labs:   Estimated body mass index is 42.25 kg/m as calculated from the following:   Height as of 05/08/17: 5\' 2"  (1.575 m).   Weight as of 05/08/17: 104.8 kg.   Imaging Review Plain radiographs demonstrate moderate degenerative joint disease of the left knee(s). The overall alignment ismild varus. The bone quality appears to be good for age and reported activity level.      Assessment/Plan:  End stage arthritis, left knee   The patient history, physical examination, clinical judgment of the provider and imaging studies are consistent with end stage degenerative joint disease of the left knee(s) and total knee arthroplasty is deemed medically necessary. The treatment options including medical management, injection therapy arthroscopy and arthroplasty were discussed at length. The risks and benefits of total knee arthroplasty were presented and reviewed. The risks due to aseptic loosening, infection, stiffness, patella tracking problems, thromboembolic complications and other  imponderables were discussed. The patient acknowledged the explanation, agreed to proceed with the plan and consent was signed. Patient is being admitted for inpatient treatment for surgery, pain control, PT, OT, prophylactic antibiotics, VTE prophylaxis, progressive ambulation and ADL's and discharge planning. The patient is planning to be discharged home with home health services    Anticipated LOS equal to or greater than 2 midnights due to - Age 21 and older with one or more of the following:  - Obesity  - Expected need for hospital services (PT, OT, Nursing) required for safe  discharge  - Anticipated need for postoperative skilled nursing care or inpatient rehab  - Active co-morbidities: None OR   - Unanticipated findings during/Post Surgery: None  - Patient is a high risk of re-admission due to: None

## 2019-05-10 ENCOUNTER — Encounter (HOSPITAL_COMMUNITY): Payer: Self-pay

## 2019-05-10 NOTE — Patient Instructions (Addendum)
DUE TO COVID-19 ONLY ONE VISITOR IS ALLOWED IN THE WAITING ROOM ONLY AT THIS TIME   COVID SWAB TESTING MUST BE COMPLETED ON: Tuesday, Aug. 11, 2020 at 11:30AM   [address], [place] [facility] enter pre surgical testing line (Must self quarantine after testing. Follow instructions on handout.)             Your procedure is scheduled on: Friday, May 24, 2019   [facility]   Report to Short Stay at 5:30 AM   Call this number if you have problems the morning of surgery [phone]   Do not eat food :After Midnight.   May have liquids until 4:30AM day of surgery   CLEAR LIQUID DIET  Foods Allowed                                                                     Foods Excluded  Water, Black Coffee and tea, regular and decaf                             liquids that you cannot  Plain Jell-O in any flavor  (No red)                                           see through such as: Fruit ices (not with fruit pulp)                                     milk, soups, orange juice  Iced Popsicles (No red)                                    All solid food Carbonated beverages, regular and diet                                    Apple juices Sports drinks like Gatorade (No red) Lightly seasoned clear broth or consume(fat free) Sugar, honey syrup  Sample Menu Breakfast                                Lunch                                     Supper Cranberry juice                    Beef broth                            Chicken broth Jell-O  Grape juice                           Apple juice Coffee or tea                        Jell-O                                      Popsicle                                                Coffee or tea                        Coffee or tea   Complete one Ensure drink the morning of surgery at  4:30AM the day of surgery.   Brush your teeth the morning of  surgery.   Do NOT smoke after Midnight   Take these medicines the morning of surgery with A SIP OF WATER:  Omeprazole    Use Inhaler per normal routine day of surgery   Bring rescue inhaler day of surgery                               You may not have any metal on your body including hair pins, jewelry, and body piercings             Do not wear make-up, lotions, powders, perfumes/cologne, or deodorant             Do not wear nail polish.  Do not shave  48 hours prior to surgery.                Do not bring valuables to the hospital. [place].   Contacts, dentures or bridgework may not be worn into surgery.   Bring small overnight bag day of surgery.    Special Instructions: Bring a copy of your healthcare power of attorney and living will documents         the day of surgery if you haven't scanned them in before.              Please read over the following fact sheets you were given:  [facility] - Preparing for Surgery Before surgery, you can play an important role.  Because skin is not sterile, your skin needs to be as free of germs as possible.  You can reduce the number of germs on your skin by washing with CHG (chlorahexidine gluconate) soap before surgery.  CHG is an antiseptic cleaner which kills germs and bonds with the skin to continue killing germs even after washing. Please DO NOT use if you have an allergy to CHG or antibacterial soaps.  If your skin becomes reddened/irritated stop using the CHG and inform your nurse when you arrive at Short Stay. Do not shave (including legs and underarms) for at least 48 hours prior to the first CHG shower.  You may shave your face/neck.  Please follow these instructions carefully:  1.  Shower with  CHG Soap the night before surgery and the  morning of surgery.  2.  If you choose to wash your hair, wash your hair first as usual with your normal  shampoo.  3.  After you shampoo, rinse  your hair and body thoroughly to remove the shampoo.                             4.  Use CHG as you would any other liquid soap.  You can apply chg directly to the skin and wash.  Gently with a scrungie or clean washcloth.  5.  Apply the CHG Soap to your body ONLY FROM THE NECK DOWN.   Do   not use on face/ open                           Wound or open sores. Avoid contact with eyes, ears mouth and   genitals (private parts).                       Wash face,  Genitals (private parts) with your normal soap.             6.  Wash thoroughly, paying special attention to the area where your    surgery  will be performed.  7.  Thoroughly rinse your body with warm water from the neck down.  8.  DO NOT shower/wash with your normal soap after using and rinsing off the CHG Soap.                9.  Pat yourself dry with a clean towel.            10.  Wear clean pajamas.            11.  Place clean sheets on your bed the night of your first shower and do not  sleep with pets. Day of Surgery : Do not apply any lotions/deodorants the morning of surgery.  Please wear clean clothes to the hospital/surgery center.  FAILURE TO FOLLOW THESE INSTRUCTIONS MAY RESULT IN THE CANCELLATION OF YOUR SURGERY  PATIENT SIGNATURE_________________________________  NURSE SIGNATURE__________________________________  ________________________________________________________________________   Colleen MireIncentive Spirometer  An incentive spirometer is a tool that can help keep your lungs clear and active. This tool measures how well you are filling your lungs with each breath. Taking long deep breaths may help reverse or decrease the chance of developing breathing (pulmonary) problems (especially infection) following:  A long period of time when you are unable to move or be active. BEFORE THE PROCEDURE   If the spirometer includes an indicator to show your best effort, your nurse or respiratory therapist will set it to a desired  goal.  If possible, sit up straight or lean slightly forward. Try not to slouch.  Hold the incentive spirometer in an upright position. INSTRUCTIONS FOR USE  1. Sit on the edge of your bed if possible, or sit up as far as you can in bed or on a chair. 2. Hold the incentive spirometer in an upright position. 3. Breathe out normally. 4. Place the mouthpiece in your mouth and seal your lips tightly around it. 5. Breathe in slowly and as deeply as possible, raising the piston or the ball toward the top of the column. 6. Hold your breath for 3-5 seconds or for as long as possible. Allow the piston or ball  to fall to the bottom of the column. 7. Remove the mouthpiece from your mouth and breathe out normally. 8. Rest for a few seconds and repeat Steps 1 through 7 at least 10 times every 1-2 hours when you are awake. Take your time and take a few normal breaths between deep breaths. 9. The spirometer may include an indicator to show your best effort. Use the indicator as a goal to work toward during each repetition. 10. After each set of 10 deep breaths, practice coughing to be sure your lungs are clear. If you have an incision (the cut made at the time of surgery), support your incision when coughing by placing a pillow or rolled up towels firmly against it. Once you are able to get out of bed, walk around indoors and cough well. You may stop using the incentive spirometer when instructed by your caregiver.  RISKS AND COMPLICATIONS  Take your time so you do not get dizzy or light-headed.  If you are in pain, you may need to take or ask for pain medication before doing incentive spirometry. It is harder to take a deep breath if you are having pain. AFTER USE  Rest and breathe slowly and easily.  It can be helpful to keep track of a log of your progress. Your caregiver can provide you with a simple table to help with this. If you are using the spirometer at home, follow these instructions: SEEK  MEDICAL CARE IF:   You are having difficultly using the spirometer.  You have trouble using the spirometer as often as instructed.  Your pain medication is not giving enough relief while using the spirometer.  You develop fever of 100.5 F (38.1 C) or higher. SEEK IMMEDIATE MEDICAL CARE IF:   You cough up bloody sputum that had not been present before.  You develop fever of 102 F (38.9 C) or greater.  You develop worsening pain at or near the incision site. MAKE SURE YOU:   Understand these instructions.  Will watch your condition.  Will get help right away if you are not doing well or get worse. Document Released: 02/06/2007 Document Revised: 12/19/2011 Document Reviewed: 04/09/2007 ExitCare Patient Information 2014 ExitCare, [place].   ________________________________________________________________________  WHAT IS A BLOOD TRANSFUSION? Blood Transfusion Information  A transfusion is the replacement of blood or some of its parts. Blood is made up of multiple cells which provide different functions.  Red blood cells carry oxygen and are used for blood loss replacement.  White blood cells fight against infection.  Platelets control bleeding.  Plasma helps clot blood.  Other blood products are available for specialized needs, such as hemophilia or other clotting disorders. BEFORE THE TRANSFUSION  Who gives blood for transfusions?   Healthy volunteers who are fully evaluated to make sure their blood is safe. This is blood bank blood. Transfusion therapy is the safest it has ever been in the practice of medicine. Before blood is taken from a donor, a complete history is taken to make sure that person has no history of diseases nor engages in risky social behavior (examples are intravenous drug use or sexual activity with multiple partners). The donor's travel history is screened to minimize risk of transmitting infections, such as malaria. The donated blood is tested for  signs of infectious diseases, such as HIV and hepatitis. The blood is then tested to be sure it is compatible with you in order to minimize the chance of a transfusion reaction. If you or a  relative donates blood, this is often done in anticipation of surgery and is not appropriate for emergency situations. It takes many days to process the donated blood. RISKS AND COMPLICATIONS Although transfusion therapy is very safe and saves many lives, the main dangers of transfusion include:   Getting an infectious disease.  Developing a transfusion reaction. This is an allergic reaction to something in the blood you were given. Every precaution is taken to prevent this. The decision to have a blood transfusion has been considered carefully by your caregiver before blood is given. Blood is not given unless the benefits outweigh the risks. AFTER THE TRANSFUSION  Right after receiving a blood transfusion, you will usually feel much better and more energetic. This is especially true if your red blood cells have gotten low (anemic). The transfusion raises the level of the red blood cells which carry oxygen, and this usually causes an energy increase.  The nurse administering the transfusion will monitor you carefully for complications. HOME CARE INSTRUCTIONS  No special instructions are needed after a transfusion. You may find your energy is better. Speak with your caregiver about any limitations on activity for underlying diseases you may have. SEEK MEDICAL CARE IF:   Your condition is not improving after your transfusion.  You develop redness or irritation at the intravenous (IV) site. SEEK IMMEDIATE MEDICAL CARE IF:  Any of the following symptoms occur over the next 12 hours:  Shaking chills.  You have a temperature by mouth above 102 F (38.9 C), not controlled by medicine.  Chest, back, or muscle pain.  People around you feel you are not acting correctly or are confused.  Shortness of breath  or difficulty breathing.  Dizziness and fainting.  You get a rash or develop hives.  You have a decrease in urine output.  Your urine turns a dark color or changes to pink, red, or brown. Any of the following symptoms occur over the next 10 days:  You have a temperature by mouth above 102 F (38.9 C), not controlled by medicine.  Shortness of breath.  Weakness after normal activity.  The white part of the eye turns yellow (jaundice).  You have a decrease in the amount of urine or are urinating less often.  Your urine turns a dark color or changes to pink, red, or brown. Document Released: 09/23/2000 Document Revised: 12/19/2011 Document Reviewed: 05/12/2008 [facility] Patient Information 2014 [place], [place].  _______________________________________________________________________

## 2019-05-13 ENCOUNTER — Ambulatory Visit: Payer: Medicare Other | Admitting: Internal Medicine

## 2019-05-13 ENCOUNTER — Encounter (HOSPITAL_COMMUNITY)
Admission: RE | Admit: 2019-05-13 | Discharge: 2019-05-13 | Disposition: A | Discharge status: Home / Self Care | Payer: Medicare Other | Source: Ambulatory Visit | Attending: Orthopedic Surgery | Admitting: Orthopedic Surgery

## 2019-05-13 ENCOUNTER — Encounter (HOSPITAL_COMMUNITY): Payer: Self-pay

## 2019-05-13 ENCOUNTER — Other Ambulatory Visit: Payer: Self-pay

## 2019-05-13 DIAGNOSIS — I10 Essential (primary) hypertension: Secondary | ICD-10-CM | POA: Insufficient documentation

## 2019-05-13 DIAGNOSIS — K219 Gastro-esophageal reflux disease without esophagitis: Secondary | ICD-10-CM | POA: Diagnosis not present

## 2019-05-13 DIAGNOSIS — E7800 Pure hypercholesterolemia, unspecified: Secondary | ICD-10-CM | POA: Insufficient documentation

## 2019-05-13 DIAGNOSIS — J449 Chronic obstructive pulmonary disease, unspecified: Secondary | ICD-10-CM | POA: Diagnosis not present

## 2019-05-13 DIAGNOSIS — Z79899 Other long term (current) drug therapy: Secondary | ICD-10-CM | POA: Diagnosis not present

## 2019-05-13 DIAGNOSIS — M1712 Unilateral primary osteoarthritis, left knee: Secondary | ICD-10-CM | POA: Insufficient documentation

## 2019-05-13 DIAGNOSIS — Z7982 Long term (current) use of aspirin: Secondary | ICD-10-CM | POA: Insufficient documentation

## 2019-05-13 DIAGNOSIS — Z01818 Encounter for other preprocedural examination: Secondary | ICD-10-CM | POA: Diagnosis not present

## 2019-05-13 HISTORY — DX: Personal history of diseases of the blood and blood-forming organs and certain disorders involving the immune mechanism: Z86.2

## 2019-05-13 HISTORY — DX: Chronic viral hepatitis B without delta-agent: B18.1

## 2019-05-13 HISTORY — DX: Chronic obstructive pulmonary disease, unspecified: J44.9

## 2019-05-13 HISTORY — DX: Anesthesia of skin: R20.0

## 2019-05-13 HISTORY — DX: Unspecified osteoarthritis, unspecified site: M19.90

## 2019-05-13 LAB — SURGICAL PCR SCREEN
MRSA, PCR: NEGATIVE
Staphylococcus aureus: NEGATIVE

## 2019-05-13 LAB — COMPREHENSIVE METABOLIC PANEL
ALT: 13 U/L (ref 0–44)
AST: 24 U/L (ref 15–41)
Albumin: 4.2 g/dL (ref 3.5–5.0)
Alkaline Phosphatase: 96 U/L (ref 38–126)
Anion gap: 11 (ref 5–15)
BUN: 15 mg/dL (ref 8–23)
CO2: 30 mmol/L (ref 22–32)
Calcium: 9.7 mg/dL (ref 8.9–10.3)
Chloride: 99 mmol/L (ref 98–111)
Creatinine, Ser: 0.68 mg/dL (ref 0.44–1.00)
GFR calc Af Amer: >60 mL/min (ref >60)
GFR calc non Af Amer: >60 mL/min (ref >60)
Glucose, Bld: 102 mg/dL — ABNORMAL HIGH (ref 70–99)
Potassium: 4.2 mmol/L (ref 3.5–5.1)
Sodium: 140 mmol/L (ref 135–145)
Total Bilirubin: 0.6 mg/dL (ref 0.3–1.2)
Total Protein: 9.2 g/dL — ABNORMAL HIGH (ref 6.5–8.1)

## 2019-05-13 LAB — URINALYSIS, ROUTINE W REFLEX MICROSCOPIC
Bilirubin Urine: NEGATIVE
Glucose, UA: NEGATIVE mg/dL
Hgb urine dipstick: NEGATIVE
Ketones, ur: NEGATIVE mg/dL
Leukocytes,Ua: NEGATIVE
Nitrite: NEGATIVE
Protein, ur: NEGATIVE mg/dL
Specific Gravity, Urine: 1.02 (ref 1.005–1.030)
pH: 6 (ref 5.0–8.0)

## 2019-05-13 LAB — CBC WITH DIFFERENTIAL/PLATELET
Abs Immature Granulocytes: 0.02 10*3/uL (ref 0.00–0.07)
Basophils Absolute: 0 10*3/uL (ref 0.0–0.1)
Basophils Relative: 1 %
Eosinophils Absolute: 0 10*3/uL (ref 0.0–0.5)
Eosinophils Relative: 1 %
HCT: 40.6 % (ref 36.0–46.0)
Hemoglobin: 12.7 g/dL (ref 12.0–15.0)
Immature Granulocytes: 0 %
Lymphocytes Relative: 32 %
Lymphs Abs: 2.3 10*3/uL (ref 0.7–4.0)
MCH: 29.3 pg (ref 26.0–34.0)
MCHC: 31.3 g/dL (ref 30.0–36.0)
MCV: 93.5 fL (ref 80.0–100.0)
Monocytes Absolute: 0.7 10*3/uL (ref 0.1–1.0)
Monocytes Relative: 9 %
Neutro Abs: 4.3 10*3/uL (ref 1.7–7.7)
Neutrophils Relative %: 57 %
Platelets: 296 10*3/uL (ref 150–400)
RBC: 4.34 MIL/uL (ref 3.87–5.11)
RDW: 13.9 % (ref 11.5–15.5)
WBC: 7.3 10*3/uL (ref 4.0–10.5)
nRBC: 0 % (ref 0.0–0.2)

## 2019-05-13 LAB — TYPE AND SCREEN
ABO/RH(D): B POS
Antibody Screen: NEGATIVE

## 2019-05-13 LAB — PROTIME-INR
INR: 1 (ref 0.8–1.2)
Prothrombin Time: 12.9 seconds (ref 11.4–15.2)

## 2019-05-13 LAB — ABO/RH: ABO/RH(D): B POS

## 2019-05-13 LAB — APTT: aPTT: 30 seconds (ref 24–36)

## 2019-05-13 NOTE — Progress Notes (Deleted)
Cardiology Office Note   Date:  05/13/2019   [facility]  [name], DOB 14-Sep-1951, MRN [id number]  PCP:  System, Pcp Not In  Cardiologist:  NEW  To Be Established and have pre-operative evaluation    History of Present Illness: Colleen [name] is a [age] y.o. female who presents for pre-operative cardiac evaluation for knee replacement. [facility] She has a history of hypertension, hypercholesterolemia, GERD and COPD and chronic Hepatitis B.  She is being seen today for pre-operative evaluation for arthroscopy and arthroplasty of the left knee in the setting of end stage arthritis.   Past Medical History:  Diagnosis Date  . Acid reflux   . Arthritis   . Asthma   . Chronic hepatitis B (HCC)   . COPD (chronic obstructive pulmonary disease) (HCC)    Mild  . GERD (gastroesophageal reflux disease)   . History of iron deficiency anemia    with pregnancy  . Hypercholesterolemia   . Hypertension   . Numbness and tingling of both upper extremities     Past Surgical History:  Procedure Laterality Date  . BACK SURGERY    . BILATERAL CARPAL TUNNEL RELEASE    . CERVICAL SPINE SURGERY    . KNEE SURGERY Left    x2     Current Outpatient Medications  Medication Sig Dispense Refill  . albuterol (PROVENTIL HFA) 108 (90 Base) MCG/ACT inhaler Inhale 2 puffs into the lungs every 4 (four) hours as needed for wheezing or shortness of breath.     Marland Kitchen. amLODipine (NORVASC) 10 MG tablet Take 10 mg by mouth daily.    Marland Kitchen. aspirin 81 MG tablet Take 81 mg by mouth daily.    . beclomethasone (QVAR) 80 MCG/ACT inhaler Inhale 2 puffs into the lungs daily.     Marland Kitchen. entecavir (BARACLUDE) 0.5 MG tablet Take 0.5 mg by mouth daily.    Marland Kitchen. gabapentin (NEURONTIN) 300 MG capsule Take 1 capsule (300 mg total) by mouth 2 (two) times daily. (Patient taking differently: Take 300 mg by mouth at bedtime. ) 60 capsule 5  . ibuprofen (ADVIL) 200 MG tablet Take 200 mg by mouth every 6  (six) hours as needed for headache or moderate pain.    . Multiple Vitamins-Minerals (CENTRUM SILVER PO) Take 1 tablet by mouth daily.    Marland Kitchen. omeprazole (PRILOSEC) 20 MG capsule Take 20 mg by mouth daily.     . traMADol (ULTRAM) 50 MG tablet Take 50 mg by mouth every 8 (eight) hours as needed for moderate pain.    . vitamin E 100 UNIT capsule Take 100 Units by mouth daily.     No current facility-administered medications for this visit.     Allergies:   Shellfish allergy and Penicillins    Social History:  The patient  reports that she has never smoked. She has never used smokeless tobacco. She reports that she does not drink alcohol or use drugs.   Family History:  The patient's family history includes Cancer in her father and mother.    ROS: All other systems are reviewed and negative. Unless otherwise mentioned in H&P    PHYSICAL EXAM: VS:  There were no vitals taken for this visit. , BMI There is no height or weight on file to calculate BMI. GEN: Well nourished, well developed, in no acute distress HEENT: normal Neck: no JVD, carotid bruits, or masses Cardiac: ***RRR; no murmurs, rubs, or gallops,no edema  Respiratory:  Clear to auscultation bilaterally, normal work of breathing GI: soft, nontender, nondistended, + BS MS: no deformity or atrophy Skin: warm and dry, no rash Neuro:  Strength and sensation are intact Psych: euthymic mood, full affect   EKG:  EKG {ACTION; IS/IS [id number]} ordered today. The ekg ordered today demonstrates ***   Recent Labs: 05/13/2019: ALT 13; BUN 15; Creatinine, Ser 0.68; Hemoglobin 12.7; Platelets 296; Potassium 4.2; Sodium 140    Lipid Panel No results found for: CHOL, TRIG, HDL, CHOLHDL, VLDL, LDLCALC, LDLDIRECT    Wt Readings from Last 3 Encounters:  05/13/19 233 lb 9.6 oz (106 kg)  05/08/17 231 lb (104.8 kg)  01/19/16 234 lb (106.1 kg)      Other studies Reviewed: Additional studies/ records that were reviewed today include:  ***. Review of the above records demonstrates: ***   ASSESSMENT AND PLAN:  1.  ***   Current medicines are reviewed at length with the patient today.    Labs/ tests ordered today include: *** Phill Myron. [place] Pugh, ANP, AACC   05/13/2019 1:01 PM    [address] Office [phone] Fax [phone]

## 2019-05-13 NOTE — Progress Notes (Signed)
SPOKE W/  Colleen [name]     SCREENING SYMPTOMS OF COVID 19:   COUGH--No  RUNNY NOSE--- No  SORE THROAT---No  NASAL CONGESTION----No  SNEEZING----No  SHORTNESS OF BREATH---No  DIFFICULTY BREATHING---No  TEMP >100.0 -----No  UNEXPLAINED BODY ACHES------No  CHILLS --------No   HEADACHES ---------No  LOSS OF SMELL/ TASTE --------No    HAVE YOU OR ANY FAMILY MEMBER TRAVELLED PAST 14 DAYS OUT OF THE   COUNTY---No STATE----No COUNTRY----No  HAVE YOU OR ANY FAMILY MEMBER BEEN EXPOSED TO ANYONE WITH COVID 19? No

## 2019-05-13 NOTE — Progress Notes (Signed)
The following are in chart: Stress ECHO results 05/02/2019 EKG 04/25/2019 Last office visit note 04/25/2019

## 2019-05-14 LAB — URINE CULTURE: Culture: NO GROWTH

## 2019-05-14 NOTE — Anesthesia Preprocedure Evaluation (Addendum)
Anesthesia Evaluation  Patient identified by MRN, date of birth, ID band Patient awake    Reviewed: Allergy & Precautions, NPO status , Patient's Chart, lab work & pertinent test results  Airway Mallampati: I  TM Distance: >3 FB Neck ROM: Limited    Dental  (+) Teeth Intact, Dental Advisory Given   Pulmonary    breath sounds clear to auscultation       Cardiovascular hypertension,  Rhythm:Regular Rate:Tachycardia     Neuro/Psych    GI/Hepatic GERD  ,(+) Hepatitis -  Endo/Other    Renal/GU      Musculoskeletal   Abdominal   Peds  Hematology   Anesthesia Other Findings   Reproductive/Obstetrics                           Anesthesia Physical Anesthesia Plan  ASA: III  Anesthesia Plan: General   Post-op Pain Management:  Regional for Post-op pain   Induction:   PONV Risk Score and Plan: 2 and Ondansetron, Dexamethasone and Midazolam  Airway Management Planned: Oral ETT  Additional Equipment:   Intra-op Plan:   Post-operative Plan: Extubation in OR  Informed Consent: I have reviewed the patients History and Physical, chart, labs and discussed the procedure including the risks, benefits and alternatives for the proposed anesthesia with the patient or authorized representative who has indicated his/her understanding and acceptance.     Dental advisory given  Plan Discussed with: Anesthesiologist and CRNA  Anesthesia Plan Comments: (See PAT note 05/13/2019, Konrad Felix, PA-C)      Anesthesia Quick Evaluation

## 2019-05-14 NOTE — Progress Notes (Addendum)
Anesthesia Chart Review   Case: [id number] Date/Time: 05/24/19 0715   Procedure: TOTAL KNEE ARTHROPLASTY (Left )   Anesthesia type: Choice   Pre-op diagnosis: OA LEFT KNEE   Location: [facility] ROOM 05 / [facility]   Surgeon: Earlie Server, MD      DISCUSSION:68 y.o. never smoker with h/o asthma, COPD, HTN, GERD, Hepatitis B, left knee OA scheduled for above procedure 05/23/2001 with Dr. Earlie Server.    Pt last seen by PCP 04/25/2019 for peroperative evaluation.  Stress echo ordered at this visit, low risk study.  Per Dr. Derl Barrow, "Patient underwent a stress echo which was normal, so I felt she was cleared from a cardiac as well as medical standpoint."  Elevated BP at PAT visit.  Pt asymptomatic.  Normal BP at last OV with cardiology.  Will evaluate DOS.   Anticipate pt can proceed with planned procedure barring acute status change.   VS: BP (!) 153/103   Pulse 74   Temp 37 C (Oral)   Resp 16   Ht 5\' 4"  (1.626 m)   Wt 106 kg   SpO2 98%   BMI 40.10 kg/m   PROVIDERS: System, Pcp Not In  Vuong, Youlanda Mighty, MD is PCP with [facility] [place], [place] LABS: Labs reviewed: Acceptable for surgery. (all labs ordered are listed, but only abnormal results are displayed)  Labs Reviewed  COMPREHENSIVE METABOLIC PANEL - Abnormal; Notable for the following components:      Result Value   Glucose, Bld 102 (*)    Total Protein 9.2 (*)    All other components within normal limits  URINE CULTURE  SURGICAL PCR SCREEN  APTT  CBC WITH DIFFERENTIAL/PLATELET  URINALYSIS, ROUTINE W REFLEX MICROSCOPIC  PROTIME-INR  TYPE AND SCREEN  ABO/RH     IMAGES:   EKG: 04/25/2019 Rate 73 bpm Sinus rhythm First degree AV block  Possible left atrial enlargement Left axis deviation LVH Anterior infarct When compared with ECG of 06-Apr-2001 Nonspecific T wave abnormality now evident in lateral leads   CV: Stress Echo 05/02/2019 Summary The patient achieved 92% of maximum predicted heart rate.  The  patient had resting hypertension and normal response to stress The patient had no chest pain during stress Normal left ventricular function at rest There was normal augmentation of all left ventricular wall segments with dobutamine Normal left ventricular function and global wall motion with stress Negative dobutamine echocardiography for inducible ischemia at target heart rate Negative stress ECG for inducible ischemia at target heart rate.  Past Medical History:  Diagnosis Date  . Acid reflux   . Arthritis   . Asthma   . Chronic hepatitis B ([place])   . COPD (chronic obstructive pulmonary disease) (HCC)    Mild  . GERD (gastroesophageal reflux disease)   . History of iron deficiency anemia    with pregnancy  . Hypercholesterolemia   . Hypertension   . Numbness and tingling of both upper extremities     Past Surgical History:  Procedure Laterality Date  . BACK SURGERY    . BILATERAL CARPAL TUNNEL RELEASE    . CERVICAL SPINE SURGERY    . KNEE SURGERY Left    x2    MEDICATIONS: . albuterol (PROVENTIL HFA) 108 (90 Base) MCG/ACT inhaler  . amLODipine (NORVASC) 10 MG tablet  . aspirin 81 MG tablet  . beclomethasone (QVAR) 80 MCG/ACT inhaler  . entecavir (BARACLUDE) 0.5 MG tablet  . gabapentin (NEURONTIN) 300 MG capsule  . ibuprofen (ADVIL) 200  MG tablet  . Multiple Vitamins-Minerals (CENTRUM SILVER PO)  . omeprazole (PRILOSEC) 20 MG capsule  . traMADol (ULTRAM) 50 MG tablet  . vitamin E 100 UNIT capsule   No current facility-administered medications for this encounter.       Janey GentaJessica Nolberto Cheuvront, PA-C [facility] [phone] 05/14/19  1:58 PM

## 2019-05-15 ENCOUNTER — Ambulatory Visit: Payer: Medicare Other | Admitting: Adult Health

## 2019-05-21 ENCOUNTER — Other Ambulatory Visit (HOSPITAL_COMMUNITY)
Admission: RE | Admit: 2019-05-21 | Discharge: 2019-05-21 | Disposition: A | Discharge status: Home / Self Care | Payer: Medicare Other | Source: Ambulatory Visit | Attending: Orthopedic Surgery | Admitting: Orthopedic Surgery

## 2019-05-21 DIAGNOSIS — Z01812 Encounter for preprocedural laboratory examination: Secondary | ICD-10-CM | POA: Diagnosis not present

## 2019-05-21 DIAGNOSIS — Z20828 Contact with and (suspected) exposure to other viral communicable diseases: Secondary | ICD-10-CM | POA: Diagnosis not present

## 2019-05-21 LAB — SARS CORONAVIRUS 2 (TAT 6-24 HRS): SARS Coronavirus 2: NEGATIVE

## 2019-05-23 MED ORDER — BUPIVACAINE LIPOSOME 1.3 % IJ SUSP
10.0000 mL | Freq: Once | INTRAMUSCULAR | Status: DC
  Filled 2019-05-23: qty 10

## 2019-05-23 MED ORDER — TRANEXAMIC ACID 1000 MG/10ML IV SOLN
2000.0000 mg | INTRAVENOUS | Status: DC
  Filled 2019-05-23: qty 20

## 2019-05-23 MED ORDER — VANCOMYCIN HCL 10 G IV SOLR
1500.0000 mg | INTRAVENOUS | Status: AC
  Administered 2019-05-24 (×2): 1500 mg via INTRAVENOUS
  Filled 2019-05-23 (×2): qty 1500

## 2019-05-24 ENCOUNTER — Encounter (HOSPITAL_COMMUNITY)
Admission: RE | Disposition: A | Discharge status: Home / Self Care | Payer: Self-pay | Source: Other Acute Inpatient Hospital | Attending: Orthopedic Surgery

## 2019-05-24 ENCOUNTER — Other Ambulatory Visit: Payer: Self-pay

## 2019-05-24 ENCOUNTER — Encounter (HOSPITAL_COMMUNITY): Payer: Self-pay | Admitting: *Deleted

## 2019-05-24 ENCOUNTER — Observation Stay (HOSPITAL_COMMUNITY)
Admission: RE | Admit: 2019-05-24 | Discharge: 2019-05-26 | Disposition: A | Discharge status: Home / Self Care | Payer: Medicare Other | Source: Other Acute Inpatient Hospital | Attending: Orthopedic Surgery | Admitting: Orthopedic Surgery

## 2019-05-24 ENCOUNTER — Ambulatory Visit (HOSPITAL_COMMUNITY): Payer: Medicare Other | Admitting: Anesthesiology

## 2019-05-24 ENCOUNTER — Ambulatory Visit (HOSPITAL_COMMUNITY): Payer: Medicare Other | Admitting: Physician Assistant

## 2019-05-24 DIAGNOSIS — M24562 Contracture, left knee: Secondary | ICD-10-CM | POA: Diagnosis not present

## 2019-05-24 DIAGNOSIS — R000 Tachycardia, unspecified: Secondary | ICD-10-CM | POA: Diagnosis not present

## 2019-05-24 DIAGNOSIS — B181 Chronic viral hepatitis B without delta-agent: Secondary | ICD-10-CM | POA: Diagnosis not present

## 2019-05-24 DIAGNOSIS — M1712 Unilateral primary osteoarthritis, left knee: Principal | ICD-10-CM | POA: Diagnosis present

## 2019-05-24 DIAGNOSIS — M25562 Pain in left knee: Secondary | ICD-10-CM | POA: Diagnosis present

## 2019-05-24 DIAGNOSIS — M21162 Varus deformity, not elsewhere classified, left knee: Secondary | ICD-10-CM | POA: Diagnosis not present

## 2019-05-24 DIAGNOSIS — Z79899 Other long term (current) drug therapy: Secondary | ICD-10-CM | POA: Insufficient documentation

## 2019-05-24 DIAGNOSIS — I10 Essential (primary) hypertension: Secondary | ICD-10-CM | POA: Diagnosis present

## 2019-05-24 DIAGNOSIS — K219 Gastro-esophageal reflux disease without esophagitis: Secondary | ICD-10-CM | POA: Diagnosis present

## 2019-05-24 DIAGNOSIS — M25762 Osteophyte, left knee: Secondary | ICD-10-CM | POA: Diagnosis not present

## 2019-05-24 DIAGNOSIS — Z7951 Long term (current) use of inhaled steroids: Secondary | ICD-10-CM | POA: Insufficient documentation

## 2019-05-24 DIAGNOSIS — Z7982 Long term (current) use of aspirin: Secondary | ICD-10-CM | POA: Insufficient documentation

## 2019-05-24 DIAGNOSIS — E7800 Pure hypercholesterolemia, unspecified: Secondary | ICD-10-CM | POA: Diagnosis present

## 2019-05-24 DIAGNOSIS — J449 Chronic obstructive pulmonary disease, unspecified: Secondary | ICD-10-CM | POA: Diagnosis not present

## 2019-05-24 HISTORY — PX: TOTAL KNEE ARTHROPLASTY: SHX125

## 2019-05-24 SURGERY — ARTHROPLASTY, KNEE, TOTAL
Anesthesia: General | Site: Knee | Laterality: Left

## 2019-05-24 MED ORDER — FENTANYL CITRATE (PF) 100 MCG/2ML IJ SOLN
INTRAMUSCULAR | Status: AC
  Filled 2019-05-24: qty 4

## 2019-05-24 MED ORDER — TRANEXAMIC ACID 1000 MG/10ML IV SOLN
INTRAVENOUS | Status: DC | PRN
  Administered 2019-05-24: 2000 mg via TOPICAL

## 2019-05-24 MED ORDER — DOCUSATE SODIUM 100 MG PO CAPS
100.0000 mg | ORAL_CAPSULE | Freq: Two times a day (BID) | ORAL | Status: DC
  Administered 2019-05-25 – 2019-05-26 (×4): 100 mg via ORAL
  Filled 2019-05-24 (×4): qty 1

## 2019-05-24 MED ORDER — SODIUM CHLORIDE 0.9 % IV SOLN: INTRAVENOUS | Status: DC

## 2019-05-24 MED ORDER — SUCCINYLCHOLINE CHLORIDE 200 MG/10ML IV SOSY
PREFILLED_SYRINGE | INTRAVENOUS | Status: AC
  Filled 2019-05-24: qty 10

## 2019-05-24 MED ORDER — MIDAZOLAM HCL 2 MG/2ML IJ SOLN
INTRAMUSCULAR | Status: AC
  Filled 2019-05-24: qty 2

## 2019-05-24 MED ORDER — TRANEXAMIC ACID-NACL 1000-0.7 MG/100ML-% IV SOLN
1000.0000 mg | INTRAVENOUS | Status: AC
  Administered 2019-05-24: 07:00:00 1000 mg via INTRAVENOUS
  Filled 2019-05-24: qty 100

## 2019-05-24 MED ORDER — CHLORHEXIDINE GLUCONATE 4 % EX LIQD: 60.0000 mL | Freq: Once | CUTANEOUS | Status: DC

## 2019-05-24 MED ORDER — BISACODYL 10 MG RE SUPP: 10.0000 mg | Freq: Every day | RECTAL | Status: DC | PRN

## 2019-05-24 MED ORDER — MIDAZOLAM HCL 5 MG/5ML IJ SOLN
INTRAMUSCULAR | Status: DC | PRN
  Administered 2019-05-24: 1.5 mg via INTRAVENOUS

## 2019-05-24 MED ORDER — TRAMADOL HCL 50 MG PO TABS
50.0000 mg | ORAL_TABLET | Freq: Three times a day (TID) | ORAL | Status: DC | PRN
  Administered 2019-05-24 – 2019-05-25 (×4): 50 mg via ORAL
  Filled 2019-05-24 (×4): qty 1

## 2019-05-24 MED ORDER — SODIUM CHLORIDE 0.9 % IV SOLN
INTRAVENOUS | Status: DC | PRN
  Administered 2019-05-24: 25 ug/min via INTRAVENOUS

## 2019-05-24 MED ORDER — PROPOFOL 10 MG/ML IV BOLUS
INTRAVENOUS | Status: DC | PRN
  Administered 2019-05-24: 170 mg via INTRAVENOUS
  Administered 2019-05-24: 30 mg via INTRAVENOUS

## 2019-05-24 MED ORDER — GABAPENTIN 300 MG PO CAPS
300.0000 mg | ORAL_CAPSULE | Freq: Every day | ORAL | Status: DC
  Administered 2019-05-25 (×2): 300 mg via ORAL
  Filled 2019-05-24 (×2): qty 1

## 2019-05-24 MED ORDER — ALBUTEROL SULFATE (2.5 MG/3ML) 0.083% IN NEBU
2.5000 mg | INHALATION_SOLUTION | RESPIRATORY_TRACT | Status: DC | PRN
  Administered 2019-05-25: 01:00:00 2.5 mg via RESPIRATORY_TRACT
  Filled 2019-05-24: qty 3

## 2019-05-24 MED ORDER — DEXAMETHASONE SODIUM PHOSPHATE 4 MG/ML IJ SOLN
INTRAMUSCULAR | Status: DC | PRN
  Administered 2019-05-24: 10 mg via INTRAVENOUS

## 2019-05-24 MED ORDER — SODIUM CHLORIDE 0.9% FLUSH
INTRAVENOUS | Status: DC | PRN
  Administered 2019-05-24: 50 mL

## 2019-05-24 MED ORDER — VANCOMYCIN HCL IN DEXTROSE 1-5 GM/200ML-% IV SOLN
1000.0000 mg | Freq: Two times a day (BID) | INTRAVENOUS | Status: AC
  Administered 2019-05-24: 18:00:00 1000 mg via INTRAVENOUS
  Filled 2019-05-24: qty 200

## 2019-05-24 MED ORDER — FENTANYL CITRATE (PF) 100 MCG/2ML IJ SOLN
INTRAMUSCULAR | Status: AC
  Filled 2019-05-24: qty 2

## 2019-05-24 MED ORDER — PROPOFOL 10 MG/ML IV BOLUS
INTRAVENOUS | Status: AC
  Filled 2019-05-24: qty 40

## 2019-05-24 MED ORDER — POVIDONE-IODINE 10 % EX SWAB: 2.0000 "application " | Freq: Once | CUTANEOUS | Status: DC

## 2019-05-24 MED ORDER — ONDANSETRON HCL 4 MG/2ML IJ SOLN
INTRAMUSCULAR | Status: DC | PRN
  Administered 2019-05-24: 4 mg via INTRAVENOUS

## 2019-05-24 MED ORDER — SUCCINYLCHOLINE CHLORIDE 20 MG/ML IJ SOLN
INTRAMUSCULAR | Status: DC | PRN
  Administered 2019-05-24: 130 mg via INTRAVENOUS

## 2019-05-24 MED ORDER — METOCLOPRAMIDE HCL 5 MG PO TABS: 5.0000 mg | ORAL_TABLET | Freq: Three times a day (TID) | ORAL | Status: DC | PRN

## 2019-05-24 MED ORDER — SODIUM CHLORIDE 0.9 % IV SOLN
INTRAVENOUS | Status: DC
  Administered 2019-05-24 – 2019-05-25 (×2): via INTRAVENOUS

## 2019-05-24 MED ORDER — MENTHOL 3 MG MT LOZG
1.0000 | LOZENGE | OROMUCOSAL | Status: DC | PRN
  Filled 2019-05-24: qty 9

## 2019-05-24 MED ORDER — ONDANSETRON HCL 4 MG/2ML IJ SOLN: 4.0000 mg | Freq: Four times a day (QID) | INTRAMUSCULAR | Status: DC | PRN

## 2019-05-24 MED ORDER — SODIUM CHLORIDE 0.9 % IR SOLN
Status: DC | PRN
  Administered 2019-05-24: 1000 mL

## 2019-05-24 MED ORDER — LACTATED RINGERS IV SOLN
INTRAVENOUS | Status: DC
  Administered 2019-05-24: 06:00:00 via INTRAVENOUS

## 2019-05-24 MED ORDER — ROCURONIUM BROMIDE 10 MG/ML (PF) SYRINGE
PREFILLED_SYRINGE | INTRAVENOUS | Status: AC
  Filled 2019-05-24: qty 10

## 2019-05-24 MED ORDER — ONDANSETRON HCL 4 MG/2ML IJ SOLN
INTRAMUSCULAR | Status: AC
  Filled 2019-05-24: qty 2

## 2019-05-24 MED ORDER — FENTANYL CITRATE (PF) 100 MCG/2ML IJ SOLN
25.0000 ug | INTRAMUSCULAR | Status: DC | PRN
  Administered 2019-05-24 (×3): 50 ug via INTRAVENOUS

## 2019-05-24 MED ORDER — FENTANYL CITRATE (PF) 100 MCG/2ML IJ SOLN
INTRAMUSCULAR | Status: DC | PRN
  Administered 2019-05-24 (×4): 50 ug via INTRAVENOUS

## 2019-05-24 MED ORDER — 0.9 % SODIUM CHLORIDE (POUR BTL) OPTIME
TOPICAL | Status: DC | PRN
  Administered 2019-05-24: 1000 mL

## 2019-05-24 MED ORDER — OXYCODONE HCL 5 MG PO TABS: ORAL_TABLET | ORAL | 0 refills | Status: DC

## 2019-05-24 MED ORDER — BUPIVACAINE LIPOSOME 1.3 % IJ SUSP
INTRAMUSCULAR | Status: DC | PRN
  Administered 2019-05-24: 10 mL

## 2019-05-24 MED ORDER — SUGAMMADEX SODIUM 200 MG/2ML IV SOLN
INTRAVENOUS | Status: DC | PRN
  Administered 2019-05-24: 200 mg via INTRAVENOUS

## 2019-05-24 MED ORDER — LIDOCAINE 2% (20 MG/ML) 5 ML SYRINGE
INTRAMUSCULAR | Status: AC
  Filled 2019-05-24: qty 5

## 2019-05-24 MED ORDER — ASPIRIN 81 MG PO CHEW
81.0000 mg | CHEWABLE_TABLET | Freq: Two times a day (BID) | ORAL | Status: DC
  Administered 2019-05-25 – 2019-05-26 (×4): 81 mg via ORAL
  Filled 2019-05-24 (×4): qty 1

## 2019-05-24 MED ORDER — PHENOL 1.4 % MT LIQD: 1.0000 | OROMUCOSAL | Status: DC | PRN

## 2019-05-24 MED ORDER — PANTOPRAZOLE SODIUM 40 MG PO TBEC
40.0000 mg | DELAYED_RELEASE_TABLET | Freq: Every day | ORAL | Status: DC
  Administered 2019-05-25 – 2019-05-26 (×2): 40 mg via ORAL
  Filled 2019-05-24 (×2): qty 1

## 2019-05-24 MED ORDER — PHENYLEPHRINE HCL (PRESSORS) 10 MG/ML IV SOLN
INTRAVENOUS | Status: AC
  Filled 2019-05-24: qty 1

## 2019-05-24 MED ORDER — OXYCODONE HCL 5 MG PO TABS
5.0000 mg | ORAL_TABLET | ORAL | Status: DC | PRN
  Administered 2019-05-24 – 2019-05-25 (×3): 10 mg via ORAL
  Administered 2019-05-26: 5 mg via ORAL
  Filled 2019-05-24: qty 1
  Filled 2019-05-24 (×3): qty 2

## 2019-05-24 MED ORDER — ROCURONIUM BROMIDE 100 MG/10ML IV SOLN
INTRAVENOUS | Status: DC | PRN
  Administered 2019-05-24: 50 mg via INTRAVENOUS

## 2019-05-24 MED ORDER — HYDROMORPHONE HCL 1 MG/ML IJ SOLN
0.5000 mg | INTRAMUSCULAR | Status: DC | PRN
  Administered 2019-05-25: 0.5 mg via INTRAVENOUS
  Filled 2019-05-24: qty 0.5

## 2019-05-24 MED ORDER — LIDOCAINE HCL (CARDIAC) PF 100 MG/5ML IV SOSY
PREFILLED_SYRINGE | INTRAVENOUS | Status: DC | PRN
  Administered 2019-05-24: 80 mg via INTRAVENOUS

## 2019-05-24 MED ORDER — METOCLOPRAMIDE HCL 5 MG/ML IJ SOLN: 5.0000 mg | Freq: Three times a day (TID) | INTRAMUSCULAR | Status: DC | PRN

## 2019-05-24 MED ORDER — BUPIVACAINE-EPINEPHRINE (PF) 0.25% -1:200000 IJ SOLN
INTRAMUSCULAR | Status: AC
  Filled 2019-05-24: qty 30

## 2019-05-24 MED ORDER — DIPHENHYDRAMINE HCL 12.5 MG/5ML PO ELIX: 12.5000 mg | ORAL_SOLUTION | ORAL | Status: DC | PRN

## 2019-05-24 MED ORDER — FLEET ENEMA 7-19 GM/118ML RE ENEM: 1.0000 | ENEMA | Freq: Once | RECTAL | Status: DC | PRN

## 2019-05-24 MED ORDER — ACETAMINOPHEN 500 MG PO TABS
1000.0000 mg | ORAL_TABLET | Freq: Four times a day (QID) | ORAL | Status: AC
  Administered 2019-05-24 – 2019-05-25 (×4): 1000 mg via ORAL
  Filled 2019-05-24 (×4): qty 2

## 2019-05-24 MED ORDER — POLYETHYLENE GLYCOL 3350 17 G PO PACK: 17.0000 g | PACK | Freq: Every day | ORAL | Status: DC | PRN

## 2019-05-24 MED ORDER — ASPIRIN 81 MG PO TABS: 81.0000 mg | ORAL_TABLET | Freq: Two times a day (BID) | ORAL | 0 refills | Status: AC

## 2019-05-24 MED ORDER — KETOROLAC TROMETHAMINE 30 MG/ML IJ SOLN
INTRAMUSCULAR | Status: DC | PRN
  Administered 2019-05-24: 30 mg via INTRAVENOUS

## 2019-05-24 MED ORDER — VITAMIN E 45 MG (100 UNIT) PO CAPS
100.0000 [IU] | ORAL_CAPSULE | Freq: Every day | ORAL | Status: DC
  Administered 2019-05-25 – 2019-05-26 (×2): 100 [IU] via ORAL
  Filled 2019-05-24 (×2): qty 1

## 2019-05-24 MED ORDER — ACETAMINOPHEN 325 MG PO TABS: 325.0000 mg | ORAL_TABLET | Freq: Four times a day (QID) | ORAL | Status: DC | PRN

## 2019-05-24 MED ORDER — ENTECAVIR 0.5 MG PO TABS: 0.5000 mg | ORAL_TABLET | Freq: Every day | ORAL | Status: DC

## 2019-05-24 MED ORDER — DEXAMETHASONE SODIUM PHOSPHATE 10 MG/ML IJ SOLN
INTRAMUSCULAR | Status: AC
  Filled 2019-05-24: qty 1

## 2019-05-24 MED ORDER — ALBUTEROL SULFATE HFA 108 (90 BASE) MCG/ACT IN AERS: 2.0000 | INHALATION_SPRAY | RESPIRATORY_TRACT | Status: DC | PRN

## 2019-05-24 MED ORDER — TRANEXAMIC ACID-NACL 1000-0.7 MG/100ML-% IV SOLN
1000.0000 mg | Freq: Once | INTRAVENOUS | Status: AC
  Administered 2019-05-24: 1000 mg via INTRAVENOUS
  Filled 2019-05-24: qty 100

## 2019-05-24 MED ORDER — ONDANSETRON HCL 4 MG PO TABS: 4.0000 mg | ORAL_TABLET | Freq: Four times a day (QID) | ORAL | Status: DC | PRN

## 2019-05-24 MED ORDER — BUPIVACAINE-EPINEPHRINE (PF) 0.25% -1:200000 IJ SOLN
INTRAMUSCULAR | Status: DC | PRN
  Administered 2019-05-24: 30 mL

## 2019-05-24 MED ORDER — AMLODIPINE BESYLATE 10 MG PO TABS
10.0000 mg | ORAL_TABLET | Freq: Every day | ORAL | Status: DC
  Administered 2019-05-24 – 2019-05-26 (×3): 10 mg via ORAL
  Filled 2019-05-24 (×3): qty 1

## 2019-05-24 MED ORDER — SODIUM CHLORIDE (PF) 0.9 % IJ SOLN
INTRAMUSCULAR | Status: AC
  Filled 2019-05-24: qty 50

## 2019-05-24 SURGICAL SUPPLY — 64 items
ANCHOR SUPER QUICK (Anchor) ×2 IMPLANT
ATTUNE MED DOME PAT 38 KNEE (Knees) ×1 IMPLANT
ATTUNE PS FEM LT SZ 5 CEM KNEE (Femur) ×1 IMPLANT
ATTUNE PSRP INSR SZ5 5 KNEE (Insert) ×1 IMPLANT
BAG DECANTER FOR FLEXI CONT (MISCELLANEOUS) ×2 IMPLANT
BAG ZIPLOCK 12X15 (MISCELLANEOUS) ×2 IMPLANT
BASE TIBIAL ROT PLAT SZ 5 KNEE (Knees) IMPLANT
BENZOIN TINCTURE PRP APPL 2/3 (GAUZE/BANDAGES/DRESSINGS) ×2 IMPLANT
BLADE SAGITTAL 25.0X1.19X90 (BLADE) ×2 IMPLANT
BLADE SAW SGTL 13X75X1.27 (BLADE) ×2 IMPLANT
BLADE SURG 15 STRL LF DISP TIS (BLADE) ×1 IMPLANT
BLADE SURG 15 STRL SS (BLADE) ×1
BLADE SURG SZ10 CARB STEEL (BLADE) ×6 IMPLANT
BNDG ELASTIC 6X15 VLCR STRL LF (GAUZE/BANDAGES/DRESSINGS) ×2 IMPLANT
BOWL SMART MIX CTS (DISPOSABLE) ×2 IMPLANT
CEMENT HV SMART SET (Cement) ×2 IMPLANT
CLSR STERI-STRIP ANTIMIC 1/2X4 (GAUZE/BANDAGES/DRESSINGS) ×4 IMPLANT
COVER SURGICAL LIGHT HANDLE (MISCELLANEOUS) ×2 IMPLANT
COVER WAND RF STERILE (DRAPES) ×1 IMPLANT
CUFF TOURN SGL QUICK 34 (TOURNIQUET CUFF) ×1
CUFF TRNQT CYL 34X4.125X (TOURNIQUET CUFF) ×1 IMPLANT
DECANTER SPIKE VIAL GLASS SM (MISCELLANEOUS) ×4 IMPLANT
DRAPE INCISE IOBAN 66X45 STRL (DRAPES) ×1 IMPLANT
DRAPE ORTHO SPLIT 77X108 STRL (DRAPES) ×2
DRAPE SURG ORHT 6 SPLT 77X108 (DRAPES) ×2 IMPLANT
DRAPE U-SHAPE 47X51 STRL (DRAPES) ×2 IMPLANT
DRESSING AQUACEL AG SP 3.5X10 (GAUZE/BANDAGES/DRESSINGS) ×1 IMPLANT
DRSG AQUACEL AG SP 3.5X10 (GAUZE/BANDAGES/DRESSINGS) ×2
DRSG PAD ABDOMINAL 8X10 ST (GAUZE/BANDAGES/DRESSINGS) ×4 IMPLANT
DURAPREP 26ML APPLICATOR (WOUND CARE) ×4 IMPLANT
ELECT REM PT RETURN 15FT ADLT (MISCELLANEOUS) ×2 IMPLANT
GAUZE SPONGE 4X4 12PLY STRL (GAUZE/BANDAGES/DRESSINGS) ×2 IMPLANT
GLOVE BIOGEL PI IND STRL 8 (GLOVE) ×2 IMPLANT
GLOVE BIOGEL PI INDICATOR 8 (GLOVE) ×2
GLOVE SURG ORTHO 8.0 STRL STRW (GLOVE) ×2 IMPLANT
GLOVE SURG SS PI 7.5 STRL IVOR (GLOVE) ×2 IMPLANT
GOWN STRL REUS W/TWL XL LVL3 (GOWN DISPOSABLE) ×4 IMPLANT
HANDPIECE INTERPULSE COAX TIP (DISPOSABLE) ×1
HOLDER FOLEY CATH W/STRAP (MISCELLANEOUS) ×1 IMPLANT
HOOD PEEL AWAY FLYTE STAYCOOL (MISCELLANEOUS) ×2 IMPLANT
IMMOBILIZER KNEE 20 (SOFTGOODS) ×2
IMMOBILIZER KNEE 20 THIGH 36 (SOFTGOODS) ×1 IMPLANT
KIT TURNOVER KIT A (KITS) ×1 IMPLANT
MANIFOLD NEPTUNE II (INSTRUMENTS) ×2 IMPLANT
NEEDLE HYPO 22GX1.5 SAFETY (NEEDLE) ×4 IMPLANT
NS IRRIG 1000ML POUR BTL (IV SOLUTION) ×1 IMPLANT
PACK ICE MAXI GEL EZY WRAP (MISCELLANEOUS) ×1 IMPLANT
PACK TOTAL KNEE CUSTOM (KITS) ×2 IMPLANT
PADDING CAST COTTON 6X4 STRL (CAST SUPPLIES) ×3 IMPLANT
PIN STEINMAN FIXATION KNEE (PIN) ×1 IMPLANT
PROTECTOR NERVE ULNAR (MISCELLANEOUS) ×2 IMPLANT
SET HNDPC FAN SPRY TIP SCT (DISPOSABLE) ×1 IMPLANT
SUT ETHIBOND NAB CT1 #1 30IN (SUTURE) ×6 IMPLANT
SUT MNCRL AB 3-0 PS2 18 (SUTURE) ×2 IMPLANT
SUT VIC AB 0 CT1 36 (SUTURE) ×2 IMPLANT
SUT VIC AB 2-0 CT1 27 (SUTURE) ×2
SUT VIC AB 2-0 CT1 TAPERPNT 27 (SUTURE) ×2 IMPLANT
SYR CONTROL 10ML LL (SYRINGE) ×4 IMPLANT
TIBIAL BASE ROT PLAT SZ 5 KNEE (Knees) ×2 IMPLANT
TRAY FOLEY MTR SLVR 14FR STAT (SET/KITS/TRAYS/PACK) ×1 IMPLANT
TRAY FOLEY MTR SLVR 16FR STAT (SET/KITS/TRAYS/PACK) ×1 IMPLANT
WATER STERILE IRR 1000ML POUR (IV SOLUTION) ×4 IMPLANT
WRAP KNEE MAXI GEL POST OP (GAUZE/BANDAGES/DRESSINGS) ×1 IMPLANT
YANKAUER SUCT BULB TIP 10FT TU (MISCELLANEOUS) ×2 IMPLANT

## 2019-05-24 NOTE — Anesthesia Postprocedure Evaluation (Signed)
Anesthesia Post Note  Patient: Colleen [name]  Procedure(s) Performed: TOTAL KNEE ARTHROPLASTY (Left Knee)     Patient location during evaluation: PACU Anesthesia Type: General Level of consciousness: awake Pain management: pain level controlled Respiratory status: spontaneous breathing Cardiovascular status: blood pressure returned to baseline Postop Assessment: no apparent nausea or vomiting Anesthetic complications: no    Last Vitals:  Vitals:   05/24/19 1143 05/24/19 1202  BP: (!) 147/95 137/84  Pulse: 94 97  Resp: 14 18  Temp: 36.9 C 37.4 C  SpO2: 98% 97%    Last Pain:  Vitals:   05/24/19 1202  TempSrc: Oral  PainSc:                  Johannes Everage

## 2019-05-24 NOTE — Evaluation (Signed)
Physical Therapy Evaluation Patient Details Name: Colleen [name] P [place] MRN: [id number] DOB: 02-18-1951 Today's Date: 05/24/2019   History of Present Illness  Pt is a [age] year old female s/p L TKA on 05/24/19  Clinical Impression  Pt is s/p TKA resulting in the deficits listed below (see PT Problem List).  Pt will benefit from skilled PT to increase their independence and safety with mobility to allow discharge to the venue listed below.  Pt assisted with ambulating short distance in hallway POD #0.  Pt plans to d/c home and states she has family assist available upon d/c.      Follow Up Recommendations Follow surgeon's recommendation for DC plan and follow-up therapies;Supervision for mobility/OOB    Equipment Recommendations  None recommended by PT    Recommendations for Other Services       Precautions / Restrictions Precautions Precautions: Knee;Fall Required Braces or Orthoses: Knee Immobilizer - Left Restrictions Weight Bearing Restrictions: No Other Position/Activity Restrictions: WBAT      Mobility  Bed Mobility Overal bed mobility: Needs Assistance Bed Mobility: Supine to Sit     Supine to sit: Min assist;HOB elevated     General bed mobility comments: verbal cues for technique, slight assist for trunk upright  Transfers Overall transfer level: Needs assistance Equipment used: Rolling walker (2 wheeled) Transfers: Sit to/from Stand Sit to Stand: Min assist         General transfer comment: assist to rise and steady; verbal cues for UE and LE positioning  Ambulation/Gait Ambulation/Gait assistance: Min assist;Min guard Gait Distance (Feet): 40 Feet Assistive device: Rolling walker (2 wheeled) Gait Pattern/deviations: Step-to pattern;Decreased stance time - left;Antalgic Gait velocity: decreased   General Gait Details: verbal cues for sequence, RW positioning, step length, posture  Stairs            Wheelchair Mobility    Modified Rankin  (Stroke Patients Only)       Balance                                             Pertinent Vitals/Pain Pain Assessment: 0-10 Pain Score: 3  Pain Location: L knee Pain Descriptors / Indicators: Aching;Sore Pain Intervention(s): Monitored during session;Repositioned;Ice applied    Home Living Family/patient expects to be discharged to:: Private residence Living Arrangements: Children;Other relatives   Type of Home: House Home Access: Stairs to enter Entrance Stairs-Rails: Left;Right;Can reach both Entrance Stairs-Number of Steps: 2 Home Layout: Able to live on main level with bedroom/bathroom Home Equipment: Walker - 2 wheels;Bedside commode Additional Comments: grandson able to assist pt upon d/c    Prior Function Level of Independence: Independent               Hand Dominance        Extremity/Trunk Assessment        Lower Extremity Assessment Lower Extremity Assessment: LLE deficits/detail LLE Deficits / Details: able to perform SLR, ROM TBA, utilized KI for first time OOB, able to perform ankle pumps       Communication   Communication: No difficulties  Cognition Arousal/Alertness: Awake/alert Behavior During Therapy: WFL for tasks assessed/performed Overall Cognitive Status: Within Functional Limits for tasks assessed  General Comments      Exercises     Assessment/Plan    PT Assessment Patient needs continued PT services  PT Problem List Decreased strength;Decreased range of motion;Decreased balance;Decreased activity tolerance;Decreased mobility;Decreased knowledge of precautions;Decreased knowledge of use of DME;Pain       PT Treatment Interventions Stair training;Gait training;DME instruction;Therapeutic exercise;Balance training;Therapeutic activities;Functional mobility training;Patient/family education    PT Goals (Current goals can be found in the Care Plan  section)  Acute Rehab PT Goals PT Goal Formulation: With patient Time For Goal Achievement: 05/29/19 Potential to Achieve Goals: Good    Frequency 7X/week   Barriers to discharge        Co-evaluation               AM-PAC PT "6 Clicks" Mobility  Outcome Measure Help needed turning from your back to your side while in a flat bed without using bedrails?: A Little Help needed moving from lying on your back to sitting on the side of a flat bed without using bedrails?: A Little Help needed moving to and from a bed to a chair (including a wheelchair)?: A Little Help needed standing up from a chair using your arms (e.g., wheelchair or bedside chair)?: A Little Help needed to walk in hospital room?: A Little Help needed climbing 3-5 steps with a railing? : A Lot 6 Click Score: 17    End of Session Equipment Utilized During Treatment: Gait belt;Left knee immobilizer Activity Tolerance: Patient tolerated treatment well Patient left: in chair;with call bell/phone within reach;with chair alarm set;with family/visitor present   PT Visit Diagnosis: Other abnormalities of gait and mobility (R26.89)    Time: 2992-4268 PT Time Calculation (min) (ACUTE ONLY): 22 min   Charges:   PT Evaluation $PT Eval Low Complexity: [place], PT, DPT Acute Rehabilitation Services Office: [phone] Pager: [phone]  Trena Platt 05/24/2019, 3:48 PM

## 2019-05-24 NOTE — Brief Op Note (Signed)
05/24/2019  10:07 AM  PATIENT:  Sebastian  68 y.o. female  PRE-OPERATIVE DIAGNOSIS:  OA LEFT KNEE  POST-OPERATIVE DIAGNOSIS:  OA LEFT KNEE  PROCEDURE:  Procedure(s): TOTAL KNEE ARTHROPLASTY (Left)  SURGEON:  Surgeon(s) and Role:    Earlie Server, MD - Primary  PHYSICIAN ASSISTANT: Chriss Czar, PA-C  ASSISTANTS: OR staff x1   ANESTHESIA:   local, regional and general  EBL:  25 mL   BLOOD ADMINISTERED:none  DRAINS: none   LOCAL MEDICATIONS USED:  MARCAINE     SPECIMEN:  No Specimen  DISPOSITION OF SPECIMEN:  N/A  COUNTS:  YES  TOURNIQUET:   Total Tourniquet Time Documented: Thigh (Left) - 60 minutes Total: Thigh (Left) - 60 minutes   DICTATION: .Other Dictation: Dictation Number unknown  PLAN OF CARE: Admit for overnight observation  PATIENT DISPOSITION:  PACU - hemodynamically stable.   Delay start of Pharmacological VTE agent (>24hrs) due to surgical blood loss or risk of bleeding: yes

## 2019-05-24 NOTE — Interval H&P Note (Signed)
History and Physical Interval Note:  05/24/2019 7:36 AM  [name]  has presented today for surgery, with the diagnosis of OA LEFT KNEE.  The various methods of treatment have been discussed with the patient and family. After consideration of risks, benefits and other options for treatment, the patient has consented to  Procedure(s): TOTAL KNEE ARTHROPLASTY (Left) as a surgical intervention.  The patient's history has been reviewed, patient examined, no change in status, stable for surgery.  I have reviewed the patient's chart and labs.  Questions were answered to the patient's satisfaction.     Colleen [name]

## 2019-05-24 NOTE — Anesthesia Procedure Notes (Signed)
Procedure Name: Intubation Date/Time: 05/24/2019 7:52 AM Performed by: Wanita Chamberlain, CRNA Pre-anesthesia Checklist: Patient identified, Emergency Drugs available, Suction available, Patient being monitored and Timeout performed Patient Re-evaluated:Patient Re-evaluated prior to induction Oxygen Delivery Method: Circle system utilized Preoxygenation: Pre-oxygenation with 100% oxygen Induction Type: IV induction Ventilation: Mask ventilation without difficulty Laryngoscope Size: Mac and 3 Grade View: Grade I Tube type: Oral Tube size: 7.5 mm Number of attempts: 1 Airway Equipment and Method: Stylet and Oral airway Placement Confirmation: breath sounds checked- equal and bilateral,  CO2 detector,  positive ETCO2 and ETT inserted through vocal cords under direct vision Secured at: 22 cm Tube secured with: Tape Dental Injury: Teeth and Oropharynx as per pre-operative assessment

## 2019-05-24 NOTE — Op Note (Signed)
Colleen [name], PILLING MEDICAL RECORD [id number] ACCOUNT [id number] DATE OF BIRTH:03-Sep-1951 FACILITY: [facility] LOCATION: [facility] PHYSICIAN:W. 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  OPERATION:  Left total knee replacement (Attune total knee) size 5 femur and tibia, 5 mm bearing with 38 mm patella.  SURGEON:  Vangie Bicker, MD  ASSISTANTMarjo Bicker  ANESTHESIA:  General anesthetic.  TOURNIQUET TIME:  57 minutes.  DESCRIPTION OF PROCEDURE:  Sterile prep and drape and exsanguination of leg inflation to 375 due to high BMI.  Straight skin incision was made with medial parapatellar approach to the knee made.  We did a 9 mm 5-degree valgus cut on the femur followed by  cutting about 2-3 mm below the most diseased medial compartment, medial release, and the knee was carried out, stripping the medial side of the knee due to the varus deformity.  The extension gap was measured at 5 mm.  The femur was sized to be a size  5, followed by placement of all-in-1 cutting block and appropriate degree of external rotation, accomplishing the anterior, posterior chamfer cuts.  PCL was released, osteophytes removed the posterior aspect of the knee.  Flexion gap equal to extension  gap at 5 mm.  The keel was cut for the tibia followed by placement of the trial tibial tray and a box cut on the femur.  Patella was cut resecting 9.5 mm and placed a 38 mm all poly trial as well as a femoral trial.  Resolution of the varus and flexion  contracture was noted with full extension, good balance of ligaments, and good stability of the bearing.  Pulsatile lavage was used on the bony surfaces.  Cement was prepared on the back table.  We inserted the tibia followed by the  femur and patella  with the trial bearing in place.  The cement was allowed to harden.  Small bits of cement were removed from the knee.  With the trial bearing removed, the tourniquet was released under direct vision.  No excessive bleeding was noted.  We did infiltrate a  mixture of Exparel and Marcaine in the capsular and subcutaneous tissues as well as using topical TXA.  Closure was effected with Ethibond.  We did insert 2 Mitek anchors.  We did not have a detachment but the soft tissue quality relative to the  patellar tendon was not really good, and we placed 2 Mitek anchors to ensure stability of the patellar tendon.  Subcutaneous tissues were closed with 0 and 2-0 Vicryl, skin with Monocryl.  A lightly compressive sterile dressing was applied, taken to  recovery room in stable condition.  LN/NUANCE  D:05/24/2019 T:05/24/2019 JOB:[id number]

## 2019-05-24 NOTE — Discharge Instructions (Signed)

## 2019-05-24 NOTE — Transfer of Care (Signed)
Immediate Anesthesia Transfer of Care Note  Patient: Colleen [name]  Procedure(s) Performed: TOTAL KNEE ARTHROPLASTY (Left Knee)  Patient Location: PACU  Anesthesia Type:General  Level of Consciousness: awake, alert , oriented and patient cooperative  Airway & Oxygen Therapy: Patient Spontanous Breathing and Patient connected to face mask oxygen  Post-op Assessment: Report given to RN and Post -op Vital signs reviewed and stable  Post vital signs: Reviewed and stable  Last Vitals:  Vitals Value Taken Time  BP 140/91 05/24/19 1010  Temp    Pulse 82 05/24/19 1011  Resp 10 05/24/19 1011  SpO2 93 % 05/24/19 1011  Vitals shown include unvalidated device data.  Last Pain:  Vitals:   05/24/19 0545  TempSrc: Oral         Complications: No apparent anesthesia complications

## 2019-05-24 NOTE — Anesthesia Procedure Notes (Signed)
Anesthesia Regional Block: Adductor canal block   Pre-Anesthetic Checklist: ,, timeout performed, Correct Patient, Correct Site, Correct Laterality, Correct Procedure, Correct Position, site marked, Risks and benefits discussed,  Surgical consent,  Pre-op evaluation,  At surgeon's request and post-op pain management  Laterality: Left  Prep: chloraprep       Needles:  Injection technique: Single-shot  Needle Type: Echogenic Stimulator Needle          Additional Needles:   Procedures: Doppler guided,,,, ultrasound used (permanent image in chart),,,,  Narrative:  Start time: 05/24/2019 6:55 AM End time: 05/24/2019 7:10 AM Injection made incrementally with aspirations every 5 mL.  Performed by: Personally  Anesthesiologist: Belinda Block, MD

## 2019-05-25 ENCOUNTER — Encounter (HOSPITAL_COMMUNITY): Payer: Self-pay | Admitting: Orthopedic Surgery

## 2019-05-25 DIAGNOSIS — J449 Chronic obstructive pulmonary disease, unspecified: Secondary | ICD-10-CM | POA: Diagnosis present

## 2019-05-25 DIAGNOSIS — M1712 Unilateral primary osteoarthritis, left knee: Secondary | ICD-10-CM | POA: Diagnosis not present

## 2019-05-25 DIAGNOSIS — I10 Essential (primary) hypertension: Secondary | ICD-10-CM | POA: Diagnosis present

## 2019-05-25 LAB — BASIC METABOLIC PANEL
Anion gap: 9 (ref 5–15)
BUN: 11 mg/dL (ref 8–23)
CO2: 25 mmol/L (ref 22–32)
Calcium: 9 mg/dL (ref 8.9–10.3)
Chloride: 106 mmol/L (ref 98–111)
Creatinine, Ser: 0.65 mg/dL (ref 0.44–1.00)
GFR calc Af Amer: >60 mL/min (ref >60)
GFR calc non Af Amer: >60 mL/min (ref >60)
Glucose, Bld: 121 mg/dL — ABNORMAL HIGH (ref 70–99)
Potassium: 3.8 mmol/L (ref 3.5–5.1)
Sodium: 140 mmol/L (ref 135–145)

## 2019-05-25 LAB — CBC
HCT: 34.7 % — ABNORMAL LOW (ref 36.0–46.0)
Hemoglobin: 10.8 g/dL — ABNORMAL LOW (ref 12.0–15.0)
MCH: 29.6 pg (ref 26.0–34.0)
MCHC: 31.1 g/dL (ref 30.0–36.0)
MCV: 95.1 fL (ref 80.0–100.0)
Platelets: 227 10*3/uL (ref 150–400)
RBC: 3.65 MIL/uL — ABNORMAL LOW (ref 3.87–5.11)
RDW: 14.6 % (ref 11.5–15.5)
WBC: 12.6 10*3/uL — ABNORMAL HIGH (ref 4.0–10.5)
nRBC: 0 % (ref 0.0–0.2)

## 2019-05-25 NOTE — Care Management Obs Status (Signed)
[facility] NOTIFICATION   Patient Details  Name: Colleen [name] MRN: [id number] Date of Birth: January 14, 1951   Medicare Observation Status Notification Given:  Yes    Joaquin Courts, RN 05/25/2019, 1:30 PM

## 2019-05-25 NOTE — Progress Notes (Signed)
PT Cancellation Note  Patient Details Name: Colleen [name] MRN: [id number] DOB: [birth year]   Cancelled Treatment:    Reason Eval/Treat Not Completed: 2nd attempt for afternoon session-pt and family member declined participation. Will check back another day.   Weston Anna, PT Acute Rehabilitation Services Pager: [phone] Office: [phone]

## 2019-05-25 NOTE — Progress Notes (Signed)
Physical Therapy Treatment Patient Details Name: Colleen [name] MRN: [id number] DOB: [birth year] Today's Date: 05/25/2019    History of Present Illness Pt is a 68 year old female s/p L TKA on 05/24/19    PT Comments    Progressing well with mobility. Will plan to have a 2nd session prior to possible d/c later today. Plan is for home with HHPT.    Follow Up Recommendations  Follow surgeon's recommendation for DC plan and follow-up therapies;Supervision for mobility/OOB     Equipment Recommendations  None recommended by PT    Recommendations for Other Services       Precautions / Restrictions Precautions Precautions: Fall;Knee Required Braces or Orthoses: Knee Immobilizer - Left(d/c'd 8/15) Restrictions Weight Bearing Restrictions: No LLE Weight Bearing: Weight bearing as tolerated    Mobility  Bed Mobility               General bed mobility comments: oob in recliner  Transfers Overall transfer level: Needs assistance Equipment used: Rolling walker (2 wheeled) Transfers: Sit to/from Stand Sit to Stand: Min guard         General transfer comment: close guard for safety. VCs safety, hand/LE placement  Ambulation/Gait Ambulation/Gait assistance: Min guard Gait Distance (Feet): 60 Feet Assistive device: Rolling walker (2 wheeled) Gait Pattern/deviations: Step-to pattern;Antalgic     General Gait Details: slow gait speed. close guard for safety.   Stairs             Wheelchair Mobility    Modified Rankin (Stroke Patients Only)       Balance Overall balance assessment: Mild deficits observed, not formally tested                                          Cognition Arousal/Alertness: Awake/alert Behavior During Therapy: WFL for tasks assessed/performed Overall Cognitive Status: Within Functional Limits for tasks assessed                                        Exercises Total Joint Exercises Ankle  Circles/Pumps: AROM;Both;10 reps;Seated Quad Sets: AROM;Both;10 reps;Seated Hip ABduction/ADduction: AAROM;Left;10 reps;Seated Straight Leg Raises: AAROM;Left;10 reps;Supine Knee Flexion: AAROM;Left;10 reps;Seated Goniometric ROM: ~10-50 degrees    General Comments        Pertinent Vitals/Pain Pain Assessment: 0-10 Pain Score: 6  Pain Location: L knee/thigh Pain Descriptors / Indicators: Aching;Sore Pain Intervention(s): Monitored during session;Ice applied    Home Living                      Prior Function            PT Goals (current goals can now be found in the care plan section) Progress towards PT goals: Progressing toward goals    Frequency    7X/week      PT Plan Current plan remains appropriate    Co-evaluation              AM-PAC PT "6 Clicks" Mobility   Outcome Measure  Help needed turning from your back to your side while in a flat bed without using bedrails?: A Little Help needed moving from lying on your back to sitting on the side of a flat bed without using bedrails?: A Little Help needed moving to and from a bed to  a chair (including a wheelchair)?: A Little Help needed standing up from a chair using your arms (e.g., wheelchair or bedside chair)?: A Little Help needed to walk in hospital room?: A Little Help needed climbing 3-5 steps with a railing? : A Lot 6 Click Score: 17    End of Session Equipment Utilized During Treatment: Gait belt Activity Tolerance: Patient tolerated treatment well Patient left: in chair;with call bell/phone within reach;with family/visitor present   PT Visit Diagnosis: Other abnormalities of gait and mobility (R26.89);Pain Pain - Right/Left: Left Pain - part of body: Knee     Time: 7341-9379 PT Time Calculation (min) (ACUTE ONLY): 24 min  Charges:  $Gait Training: 8-22 mins $Therapeutic Exercise: 8-22 mins                        Weston Anna, PT Acute Rehabilitation Services Pager:  [phone] Office: [phone]

## 2019-05-25 NOTE — TOC Initial Note (Signed)
Transition of Care [facility]) - Initial/Assessment Note    Patient Details  Name: CAMYLA CAMPOSANO MRN: [id number] Date of Birth: Aug 04, 1951  [facility] CM/SW Contact:    Joaquin Courts, RN Phone Number: 05/25/2019, 10:17 AM  Clinical Narrative:     CM spoke with patient at bedside. Patient set up with [facility] at home for [place]. Patient reports she has rolling walker and 3-in-1 at home.               Expected Discharge Plan: [place] Barriers to Discharge: Continued Medical Work up   Patient Goals and CMS Choice Patient states their goals for this hospitalization and ongoing recovery are:: to go home CMS Medicare.gov Compare Post Acute Care list provided to:: Patient Choice offered to / list presented to : Patient  Expected Discharge Plan and Services Expected Discharge Plan: [place]   Discharge Planning Services: CM Consult Post Acute Care Choice: [place] arrangements for the past 2 months: Single Family Home                 DME Arranged: N/A DME Agency: NA       HH Arranged: PT HH Agency: [facility] [employer] Date [place]: 05/25/19 Time [place]: 1012 Representative spoke with at [place]: [place] Arrangements/Services Living arrangements for the past 2 months: [place] Lives with:: Adult Children Patient language and need for interpreter reviewed:: Yes Do you feel safe going back to the place where you live?: Yes      Need for Family Participation in Patient Care: Yes (Comment) Care giver support system in place?: Yes (comment)   Criminal Activity/Legal Involvement Pertinent to Current Situation/Hospitalization: No - Comment as needed  Activities of Daily Living Home Assistive Devices/Equipment: Bedside commode/3-in-1, Eyeglasses, Walker (specify type) ADL Screening (condition at time of admission) Patient's cognitive  ability adequate to safely complete daily activities?: Yes Is the patient deaf or have difficulty hearing?: No Does the patient have difficulty seeing, even when wearing glasses/contacts?: No Does the patient have difficulty concentrating, remembering, or making decisions?: No Patient able to express need for assistance with ADLs?: Yes Does the patient have difficulty dressing or bathing?: No Independently performs ADLs?: Yes (appropriate for developmental age) Does the patient have difficulty walking or climbing stairs?: Yes Weakness of Legs: Left Weakness of Arms/Hands: None  Permission Sought/Granted                  Emotional Assessment Appearance:: Appears stated age Attitude/Demeanor/Rapport: Engaged Affect (typically observed): Accepting Orientation: : Oriented to Self, Oriented to Place, Oriented to  Time, Oriented to Situation   Psych Involvement: No (comment)  Admission diagnosis:  OA LEFT KNEE Patient Active Problem List   Diagnosis Date Noted  . Primary localized osteoarthritis of left knee 05/24/2019  . GERD (gastroesophageal reflux disease)   . Hypercholesterolemia    PCP:  System, Pcp Not In Pharmacy:   [facility] ([place]), [place] - [address] [place] ([place]) [place] [address] Phone: [phone] Fax: [phone]     Social Determinants of Health (SDOH) Interventions    Readmission Risk Interventions No flowsheet data found.

## 2019-05-25 NOTE — Progress Notes (Signed)
PT Cancellation Note  Patient Details Name: SHANNEN FLANSBURG MRN: [id number] DOB: Mar 27, 1951   Cancelled Treatment:    Reason Eval/Treat Not Completed: Attmepted PT tx session. Pt and family declined to participate at this time. Will check back as schedule allows.   Weston Anna, PT Acute Rehabilitation Services Pager: [phone] Office: [phone]

## 2019-05-25 NOTE — Progress Notes (Addendum)
Orthopedic Trauma Service Progress Note Weekend Coverage   Patient ID: Colleen DialMayzell P [name] MRN: [id number] DOB/AGE: Sep 13, 1951 68 y.o.  Subjective:  Doing ok  No specific issues Mobilizing slowly   Feels like she will be ready to go home tomorrow  Has all equipment at home  Did not go in CPM today but was in her zero knee bone foam for several hours   Urinating frequently but this is baseline   Review of Systems  Constitutional: Negative for chills and fever.  Respiratory: Negative for shortness of breath and wheezing.   Cardiovascular: Negative for chest pain and palpitations.  Gastrointestinal: Negative for abdominal pain, nausea and vomiting.    Objective:   VITALS:   Vitals:   05/25/19 0100 05/25/19 0124 05/25/19 0450 05/25/19 2110  BP: 138/78  127/79 (!) 162/93  Pulse: 87  78 100  Resp: 16  16 18   Temp: 98.5 F (36.9 C)  97.9 F (36.6 C) 100 F (37.8 C)  TempSrc: Oral  Oral Oral  SpO2: 99% 97% 95% 99%  Weight:      Height:        Estimated body mass index is 40.11 kg/m as calculated from the following:   Height as of this encounter: 5\' 4"  (1.626 m).   Weight as of this encounter: 106 kg.   Intake/Output      08/15 0701 - 08/16 0700   P.O. 240   I.V. (mL/kg)    IV Piggyback    Total Intake(mL/kg) 240 (2.3)   Urine (mL/kg/hr) 850 (0.6)   Blood    Total Output 850   Net -610       Urine Occurrence 1 x     LABS  Results for orders placed or performed during the hospital encounter of 05/24/19 (from the past 24 hour(s))  CBC     Status: Abnormal   Collection Time: 05/25/19  2:18 AM  Result Value Ref Range   WBC 12.6 (H) 4.0 - 10.5 K/uL   RBC 3.65 (L) 3.87 - 5.11 MIL/uL   Hemoglobin 10.8 (L) 12.0 - 15.0 g/dL   HCT 40.934.7 (L) 81.136.0 - 91.446.0 %   MCV 95.1 80.0 - 100.0 fL   MCH 29.6 26.0 - 34.0 pg   MCHC 31.1 30.0 - 36.0 g/dL   RDW 78.214.6 95.611.5 - 21.315.5 %   Platelets 227 150 - 400  K/uL   nRBC 0.0 0.0 - 0.2 %  Basic metabolic panel     Status: Abnormal   Collection Time: 05/25/19  2:18 AM  Result Value Ref Range   Sodium 140 135 - 145 mmol/L   Potassium 3.8 3.5 - 5.1 mmol/L   Chloride 106 98 - 111 mmol/L   CO2 25 22 - 32 mmol/L   Glucose, Bld 121 (H) 70 - 99 mg/dL   BUN 11 8 - 23 mg/dL   Creatinine, Ser 0.860.65 0.44 - 1.00 mg/dL   Calcium 9.0 8.9 - 57.810.3 mg/dL   GFR calc non Af Amer >60 >60 mL/min   GFR calc Af Amer >60 >60 mL/min   Anion gap 9 5 - 15     PHYSICAL EXAM:   Gen: pleasant, resting comfortably in bed, NAD Lungs: clear anterior fields Cardiac: regular  Abd: + BS, NTND Ext:       Left  Lower extremity   Dressing c/d/i  Ext warm   + dp pulse  Compartments are soft   No pain with passive stretch   No DCT  DPN, SPN, TN sensation intact  EHL, FHL, AT, PT, peroneals, gastroc motor intact    Assessment/Plan: 1 Day Post-Op   Principal Problem:   Primary localized osteoarthritis of left knee Active Problems:   GERD (gastroesophageal reflux disease)   Hypercholesterolemia   Hypertension   COPD (chronic obstructive pulmonary disease) ([place])   Anti-infectives (From admission, onward)   Start     Dose/Rate Route Frequency Ordered Stop   05/24/19 1800  vancomycin (VANCOCIN) IVPB 1000 mg/200 mL premix     1,000 mg 200 mL/hr over 60 Minutes Intravenous Every 12 hours 05/24/19 1158 05/24/19 1928   05/24/19 1600  entecavir (BARACLUDE) tablet 0.5 mg     0.5 mg Oral Daily 05/24/19 1158     05/24/19 0600  vancomycin (VANCOCIN) 1,500 mg in sodium chloride 0.9 % 500 mL IVPB     1,500 mg 250 mL/hr over 120 Minutes Intravenous On call to O.R. 05/23/19 0086 05/24/19 0824    .  POD/HD#: 1  68 y/o female with endstage DJD Left knee  - endstage DJD Left knee s/p TKA  Weight-bear as tolerated left leg using walker  Ice and elevate for swelling and pain control  Continue with PT and OT  Dressing can remain on until office follow-up    - Pain  management:  Continue with current regimen  - ABL ane mia/Hemodynamics  Hypertension   Stable   On home medications   CBC in the morning  - Medical issues   Home meds  - DVT/PE prophylaxis:  Aspirin BID  - ID:   Perioperative antibiotics completed  - Activity:  Weight-bear as tolerated left leg with assistive device  - FEN/GI prophylaxis/Foley/Lines:  Regular diet  Urinary frequency-baseline per patient   Keagle exercises   Preoperative UA negative  - Dispo:  Plan for discharge home on 05/26/2019  Continue with current care  PT and OT sessions prior to discharge tomorrow   Jari Pigg, PA-C [phone] (C) 05/25/2019, 9:18 PM  [facility] [place] [address] [phone] Domingo Sep (F)

## 2019-05-25 NOTE — Progress Notes (Signed)
    Home health agencies that serve [phone].        [place] Quality of Patient Care Rating Patient Survey Summary Rating  [facility] [phone] 3 out of 5 stars 4 out of [place] [phone] 4 out of 5 stars 4 out of [place] [phone] 4  out of 5 stars 3 out of [place] [phone] 4 out of 5 stars 4 out of [place] [phone] 4 out of 5 stars 4 out of 5 stars  [facility] [place] [phone] 3  out of 5 stars 4 out of [place] [phone] 3 out of 5 stars 4 out of 5 stars  [facility] ([phone]) [phone] 4 out of 5 stars Not Available12  [facility] [phone] 3  out of 5 stars 3 out of [place] [phone] 3  out of 5 stars 4 out of [place] [phone] 3  out of 5 stars 3 out of [place] [phone] 4  out of 5 stars 3 out of [place] number Footnote as displayed on [facility]  1 This agency provides services under a federal waiver program to non-traditional, chronic long term population.  2 This agency provides services to a special needs population.  3 Not Available.  4 The number of patient episodes for this measure is too small to report.  5 This measure currently does not have data or provider has been certified/recertified for less than 6 months.  6 The national average for this measure is not provided because of state-to-state differences in data collection.  7 Medicare is not displaying rates for this measure for any home health agency, because of an issue with the data.  8 There were problems with the data and they are being corrected.  9 Zero, or very few, patients met  the survey's rules for inclusion. The scores shown, if any, reflect a very small number of surveys and may not accurately tell how an agency is doing.  10 Survey results are based on less than 12 months of data.  11 Fewer than 70 patients completed the survey. Use the scores shown, if any, with caution as the number of surveys may be too low to accurately tell how an agency is doing.  12 No survey results are available for this period.  13 Data suppressed by CMS for one or more quarters.

## 2019-05-26 DIAGNOSIS — M1712 Unilateral primary osteoarthritis, left knee: Secondary | ICD-10-CM | POA: Diagnosis not present

## 2019-05-26 LAB — CBC
HCT: 33.9 % — ABNORMAL LOW (ref 36.0–46.0)
HCT: 34.5 % — ABNORMAL LOW (ref 36.0–46.0)
Hemoglobin: 10.8 g/dL — ABNORMAL LOW (ref 12.0–15.0)
Hemoglobin: 11.1 g/dL — ABNORMAL LOW (ref 12.0–15.0)
MCH: 29.7 pg (ref 26.0–34.0)
MCH: 29.9 pg (ref 26.0–34.0)
MCHC: 31.9 g/dL (ref 30.0–36.0)
MCHC: 32.2 g/dL (ref 30.0–36.0)
MCV: 93.1 fL (ref 80.0–100.0)
MCV: 93 fL (ref 80.0–100.0)
Platelets: 227 10*3/uL (ref 150–400)
Platelets: 221 10*3/uL (ref 150–400)
RBC: 3.64 MIL/uL — ABNORMAL LOW (ref 3.87–5.11)
RBC: 3.71 MIL/uL — ABNORMAL LOW (ref 3.87–5.11)
RDW: 14.3 % (ref 11.5–15.5)
RDW: 14.4 % (ref 11.5–15.5)
WBC: 11.9 10*3/uL — ABNORMAL HIGH (ref 4.0–10.5)
WBC: 12.9 10*3/uL — ABNORMAL HIGH (ref 4.0–10.5)
nRBC: 0 % (ref 0.0–0.2)
nRBC: 0 % (ref 0.0–0.2)

## 2019-05-26 MED ORDER — ACETAMINOPHEN 500 MG PO TABS
1000.0000 mg | ORAL_TABLET | Freq: Once | ORAL | Status: AC
  Administered 2019-05-26: 1000 mg via ORAL
  Filled 2019-05-26: qty 2

## 2019-05-26 NOTE — Progress Notes (Signed)
Physical Therapy Treatment Patient Details Name: Colleen [name] MRN: [id number] DOB: 1951/04/05 Today's Date: 05/26/2019    History of Present Illness Pt is a 68 year old female s/p L TKA on 05/24/19    PT Comments    Progressing with mobility. Reviewed/practiced exercises, gait training, and stair training. All education completed. Okay to d/c from PT standpoint.    Follow Up Recommendations  Follow surgeon's recommendation for DC plan and follow-up therapies;Supervision for mobility/OOB     Equipment Recommendations  None recommended by PT    Recommendations for Other Services       Precautions / Restrictions Precautions Precautions: Fall;Knee Restrictions Weight Bearing Restrictions: No LLE Weight Bearing: Weight bearing as tolerated    Mobility  Bed Mobility               General bed mobility comments: oob in recliner  Transfers Overall transfer level: Needs assistance Equipment used: Rolling walker (2 wheeled) Transfers: Sit to/from Stand Sit to Stand: Min guard         General transfer comment: close guard for safety. VCs safety, hand/LE placement. Increased time  Ambulation/Gait Ambulation/Gait assistance: Min guard Gait Distance (Feet): 65 Feet Assistive device: Rolling walker (2 wheeled) Gait Pattern/deviations: Step-to pattern;Antalgic     General Gait Details: slow gait speed. close guard for safety.   Stairs Stairs: Yes Stairs assistance: Min guard Stair Management: Step to pattern;Forwards;Two rails Number of Stairs: 2 General stair comments: up and over portable steps. VCs safety, technique, sequence. Pt reported that she will only have 1 rail to hold on to however she insisted on using both rails. Attempted to practice with 1 rail, 1 cane but pt declined.   Wheelchair Mobility    Modified Rankin (Stroke Patients Only)       Balance                                            Cognition  Arousal/Alertness: Awake/[name] Behavior During Therapy: WFL for tasks assessed/performed Overall Cognitive Status: Within Functional Limits for tasks assessed                                        Exercises Total Joint Exercises Ankle Circles/Pumps: AROM;Both;10 reps;Seated Quad Sets: AROM;Both;10 reps;Seated Heel Slides: AAROM;Left;10 reps;Supine Hip ABduction/ADduction: AAROM;Left;10 reps;Supine Straight Leg Raises: AAROM;Left;10 reps;Supine Goniometric ROM: ~10-50 (limited by pain)    General Comments        Pertinent Vitals/Pain Pain Assessment: 0-10 Pain Score: 6  Pain Location: L knee/thigh Pain Descriptors / Indicators: Aching;Sore Pain Intervention(s): Monitored during session;Ice applied    Home Living                      Prior Function            PT Goals (current goals can now be found in the care plan section) Progress towards PT goals: Progressing toward goals    Frequency    7X/week      PT Plan Current plan remains appropriate    Co-evaluation              AM-PAC PT "6 Clicks" Mobility   Outcome Measure  Help needed turning from your back to your side while in a flat bed without using bedrails?:  A Little Help needed moving from lying on your back to sitting on the side of a flat bed without using bedrails?: A Little Help needed moving to and from a bed to a chair (including a wheelchair)?: A Little Help needed standing up from a chair using your arms (e.g., wheelchair or bedside chair)?: A Little Help needed to walk in hospital room?: A Little Help needed climbing 3-5 steps with a railing? : A Little 6 Click Score: 18    End of Session Equipment Utilized During Treatment: Gait belt Activity Tolerance: Patient tolerated treatment well Patient left: in chair;with call bell/phone within reach   PT Visit Diagnosis: Other abnormalities of gait and mobility (R26.89);Pain Pain - Right/Left: Left Pain - part of  body: Knee     Time: [id number] PT Time Calculation (min) (ACUTE ONLY): 37 min  Charges:  $Gait Training: 8-22 mins $Therapeutic Exercise: 8-22 mins                        Colleen AlertJannie Alayjah [name], PT Acute Rehabilitation Services Pager: [phone] Office: [phone]

## 2019-05-26 NOTE — Progress Notes (Addendum)
Orthopedic Trauma Service Progress Note Weekend Coverage   Patient ID: Colleen [name] MRN: [id number] DOB/AGE: 1951-08-28 68 y.o.  Subjective:  Doing great  No complaints Ready to go home, wants to go home  Some tachycardia and mildly elevated BP noted but not having any CP, SOB No h/a No lightheadedness Denies palpitations  No blurred vision   Pt on home meds for HTN She has only had one dose of oxy at about 0930 today  Pt is quite stoic and is pleasant when refusing pain meds   Worked with PT, ambulated about 65 ft  Got in CPM yesterday around 1930 Has not been in CPM this am    ROS As above  Objective:   VITALS:   Vitals:   05/25/19 0450 05/25/19 2110 05/26/19 0614 05/26/19 1332  BP: 127/79 (!) 162/93 (!) 166/94 (!) 148/75  Pulse: 78 100 (!) 108 (!) 115  Resp: 16 18 18 16   Temp: 97.9 F (36.6 C) 100 F (37.8 C) 99.8 F (37.7 C) 99.3 F (37.4 C)  TempSrc: Oral Oral Oral Oral  SpO2: 95% 99% 99% 99%  Weight:      Height:        Estimated body mass index is 40.11 kg/m as calculated from the following:   Height as of this encounter: 5\' 4"  (1.626 m).   Weight as of this encounter: 106 kg.   Intake/Output      08/15 0701 - 08/16 0700 08/16 0701 - 08/17 0700   P.O. 460    I.V. (mL/kg)     IV Piggyback     Total Intake(mL/kg) 460 (4.3)    Urine (mL/kg/hr) 1550 (0.6) 200 (0.3)   Blood     Total Output 1550 200   Net -1090 -200        Urine Occurrence 3 x      LABS  Results for orders placed or performed during the hospital encounter of 05/24/19 (from the past 24 hour(s))  CBC     Status: Abnormal   Collection Time: 05/26/19  3:11 AM  Result Value Ref Range   WBC 11.9 (H) 4.0 - 10.5 K/uL   RBC 3.64 (L) 3.87 - 5.11 MIL/uL   Hemoglobin 10.8 (L) 12.0 - 15.0 g/dL   HCT 40.933.9 (L) 81.136.0 - 91.446.0 %   MCV 93.1 80.0 - 100.0 fL   MCH 29.7 26.0 - 34.0 pg   MCHC 31.9 30.0 - 36.0  g/dL   RDW 78.214.3 95.611.5 - 21.315.5 %   Platelets 227 150 - 400 K/uL   nRBC 0.0 0.0 - 0.2 %     PHYSICAL EXAM:     Gen: pleasant, resting comfortably in bed, NAD Lungs: clear anterior fields Cardiac: regular, slightly tachy at 110 Abd: + BS, NTND Ext:       Left Lower extremity              Dressing c/d/i  Ace wrap bunched up   Removed ace wrap              Ext warm              + dp pulse             Compartments are soft  No pain with passive stretch              No DCT             DPN, SPN, TN sensation intact             EHL, FHL, AT, PT, peroneals, gastroc motor intact   Assessment/Plan: 2 Days Post-Op   Principal Problem:   Primary localized osteoarthritis of left knee Active Problems:   GERD (gastroesophageal reflux disease)   Hypercholesterolemia   Hypertension   COPD (chronic obstructive pulmonary disease) ([place])   Anti-infectives (From admission, onward)   Start     Dose/Rate Route Frequency Ordered Stop   05/24/19 1800  vancomycin (VANCOCIN) IVPB 1000 mg/200 mL premix     1,000 mg 200 mL/hr over 60 Minutes Intravenous Every 12 hours 05/24/19 1158 05/24/19 1928   05/24/19 1600  entecavir (BARACLUDE) tablet 0.5 mg     0.5 mg Oral Daily 05/24/19 1158     05/24/19 0600  vancomycin (VANCOCIN) 1,500 mg in sodium chloride 0.9 % 500 mL IVPB     1,500 mg 250 mL/hr over 120 Minutes Intravenous On call to O.R. 05/23/19 6606 05/24/19 0824    .  POD/HD#: 2  68 y/o female with endstage DJD Left knee   - endstage DJD Left knee s/p TKA             Weight-bear as tolerated left leg using walker             Ice and elevate for swelling and pain control             Continue with PT and OT             Dressing can remain on until office follow-up   Removed ace wrap but left aquacel Ag   TED hose before dc                 - Pain management:             Continue with current regimen   - ABL ane mia/Hemodynamics             Hypertension                          Stable                         On home medications               CBC stable this am    Slightly tachy on exam but did quite a bit with therapy today    No complaints    Suspect pain may be contributing as well     Check stat CBC    If cbc stable will dc home today    - Medical issues              Home meds   - DVT/PE prophylaxis:             Aspirin BID  - ID:              Perioperative antibiotics completed   - Activity:             Weight-bear as tolerated left leg with assistive device   - FEN/GI prophylaxis/Foley/Lines:             Regular diet  Urinary frequency-baseline per patient                         Keagle exercises                         Preoperative UA negative   - Dispo:             Plan for discharge home today   Follow up with orthopaedics in 2 weeks                 Mearl LatinKeith W. Dellia Donnelly, PA-C [phone] (C) 05/26/2019, 2:23 PM  [facility] [address] [place] [address] [phone] Collier Bullock(O) [phone] (F)

## 2019-05-27 ENCOUNTER — Encounter (HOSPITAL_COMMUNITY): Payer: Self-pay | Admitting: Orthopedic Surgery

## 2019-06-20 NOTE — Discharge Summary (Signed)
PATIENT Colleen [name]        MRN:  [id number]          DOB/AGE: 67-21-1952 / 68 y.o.    DISCHARGE SUMMARY  ADMISSION DATE:    05/24/2019 DISCHARGE DATE:  05/26/2019  ADMISSION DIAGNOSIS: OA LEFT KNEE    DISCHARGE DIAGNOSIS:  OA LEFT KNEE    ADDITIONAL DIAGNOSIS: Principal Problem:   Primary localized osteoarthritis of left knee Active Problems:   GERD (gastroesophageal reflux disease)   Hypercholesterolemia   Hypertension   COPD (chronic obstructive pulmonary disease) ([facility])  Past Medical History:  Diagnosis Date  . Acid reflux   . Arthritis   . Asthma   . Chronic hepatitis B ([place])   . COPD (chronic obstructive pulmonary disease) ([facility])    Mild  . GERD (gastroesophageal reflux disease)   . History of iron deficiency anemia    with pregnancy  . Hypercholesterolemia   . Hypertension   . Numbness and tingling of both upper extremities     PROCEDURE: Procedure(s): TOTAL KNEE ARTHROPLASTY Left on 05/24/2019  CONSULTS: PT    HISTORY:  See H&P in chart  HOSPITAL COURSE:  Colleen [name] is a 68 y.o. admitted on 05/24/2019 and found to have a diagnosis of OA LEFT KNEE.  After appropriate laboratory studies were obtained  they were taken to the operating room on 05/24/2019 and underwent  Procedure(s): TOTAL KNEE ARTHROPLASTY  Left.   They were given perioperative antibiotics:  Anti-infectives (From admission, onward)   Start     Dose/Rate Route Frequency Ordered Stop   05/24/19 1800  vancomycin (VANCOCIN) IVPB 1000 mg/200 mL premix     1,000 mg 200 mL/hr over 60 Minutes Intravenous Every 12 hours 05/24/19 1158 05/24/19 1928   05/24/19 1600  entecavir (BARACLUDE) tablet 0.5 mg  Status:  Discontinued     0.5 mg Oral Daily 05/24/19 1158 05/26/19 1908   05/24/19 0600  vancomycin (VANCOCIN) 1,500 mg in sodium chloride 0.9 % 500 mL IVPB     1,500 mg 250 mL/hr over 120 Minutes Intravenous On call to O.R. 05/23/19 2094 05/24/19 0824    .  Tolerated the procedure  well.  Placed with a foley intraoperatively.    POD #1, allowed out of bed to a chair.  PT for ambulation and exercise program.  Foley D/C'd in morning.  IV saline locked.  O2 discontionued.  POD #2, continued PT and ambulation.    The remainder of the hospital course was dedicated to ambulation and strengthening.   The patient was discharged on 2 days post op in  Stable condition.  Blood products given:none  DIAGNOSTIC STUDIES: Recent vital signs: No data found.     Recent laboratory studies: No results for input(s): WBC, HGB, HCT, PLT in the last 168 hours. No results for input(s): NA, K, CL, CO2, BUN, CREATININE, GLUCOSE, CALCIUM in the last 168 hours. Lab Results  Component Value Date   INR 1.0 05/13/2019     Recent Radiographic Studies :  No results found.  DISCHARGE INSTRUCTIONS:   DISCHARGE MEDICATIONS:   Allergies as of 05/26/2019      Reactions   Shellfish Allergy Shortness Of Breath, Swelling   Penicillins Hives, Swelling, Other (See Comments)   Did it involve swelling of the face/tongue/throat, SOB, or low BP? Yes Did it involve sudden or severe rash/hives, skin peeling, or any reaction on the inside of your mouth or nose? Yes Did you need to seek medical attention at  a hospital or doctor's office? Yes When did it last happen? Late 20's If all above answers are "NO", may proceed with cephalosporin use.      Medication List    STOP taking these medications   ibuprofen 200 MG tablet Commonly known as: ADVIL     TAKE these medications   amLODipine 10 MG tablet Commonly known as: NORVASC Take 10 mg by mouth daily.   aspirin 81 MG tablet Take 1 tablet (81 mg total) by mouth 2 (two) times daily. What changed: when to take this   beclomethasone 80 MCG/ACT inhaler Commonly known as: QVAR Inhale 2 puffs into the lungs daily.   CENTRUM SILVER PO Take 1 tablet by mouth daily.   entecavir 0.5 MG tablet Commonly known as: BARACLUDE Take 0.5 mg by mouth  daily.   gabapentin 300 MG capsule Commonly known as: NEURONTIN Take 1 capsule (300 mg total) by mouth 2 (two) times daily. What changed: when to take this   omeprazole 20 MG capsule Commonly known as: PRILOSEC Take 20 mg by mouth daily.   oxyCODONE 5 MG immediate release tablet Commonly known as: Oxy IR/ROXICODONE Take one tab po q4-6hrs prn pain, may need 1-2 first couple weeks   Proventil HFA 108 (90 Base) MCG/ACT inhaler Generic drug: albuterol Inhale 2 puffs into the lungs every 4 (four) hours as needed for wheezing or shortness of breath.   traMADol 50 MG tablet Commonly known as: ULTRAM Take 50 mg by mouth every 8 (eight) hours as needed for moderate pain.   vitamin E 100 UNIT capsule Take 100 Units by mouth daily.       FOLLOW UP VISIT:   Follow-up Information    Frederico Hammanaffrey, Daniel, MD. Schedule an appointment as soon as possible for a visit in 2 weeks.   Specialty: Orthopedic Surgery Contact information: [address] [place] [address] [phone]        [facility] Follow up.   Specialty: Home Health Services Why: agency will provide home health physical therapy. agency will call you to schedule first visit. Contact information: [address] [place] [address] [phone]           DISPOSITION:   Home  CONDITION:  Stable   Margart SicklesJoshua Bethanne Mule, PA-C  06/20/2019 2:02 PM

## 2019-12-06 ENCOUNTER — Ambulatory Visit: Payer: Medicare Other | Attending: Internal Medicine

## 2019-12-06 DIAGNOSIS — Z23 Encounter for immunization: Secondary | ICD-10-CM

## 2019-12-06 NOTE — Progress Notes (Signed)
   Covid-19 Vaccination Clinic  Name:  ELKA SATTERFIELD    MRN: [id number] DOB: Jul 29, 1951  12/06/2019  Ms. Cummings was observed post Covid-19 immunization for 15 minutes without incidence. She was provided with Vaccine Information Sheet and instruction to access the V-Safe system.   Ms. Prophete was instructed to call 911 with any severe reactions post vaccine: Marland Kitchen Difficulty breathing  . Swelling of your face and throat  . A fast heartbeat  . A bad rash all over your body  . Dizziness and weakness    Immunizations Administered    Name Date Dose VIS Date Route   Pfizer COVID-19 Vaccine 12/06/2019  8:58 AM 0.3 mL 09/20/2019 Intramuscular   Manufacturer: [employer], [employer]   Lot: [id number]   NDC: [address]-6

## 2019-12-31 ENCOUNTER — Ambulatory Visit: Payer: Medicare Other | Attending: Internal Medicine

## 2019-12-31 DIAGNOSIS — Z23 Encounter for immunization: Secondary | ICD-10-CM

## 2019-12-31 NOTE — Progress Notes (Signed)
   Covid-19 Vaccination Clinic  Name:  BAYLIN CABAL    MRN: [id number] DOB: September 07, 1951  12/31/2019  Ms. Goostree was observed post Covid-19 immunization for 15 minutes without incident. She was provided with Vaccine Information Sheet and instruction to access the V-Safe system.   Ms. Orwick was instructed to call 911 with any severe reactions post vaccine: Marland Kitchen Difficulty breathing  . Swelling of face and throat  . A fast heartbeat  . A bad rash all over body  . Dizziness and weakness   Immunizations Administered    Name Date Dose VIS Date Route   Pfizer COVID-19 Vaccine 12/31/2019 10:02 AM 0.3 mL 09/20/2019 Intramuscular   Manufacturer: [employer], [employer]   Lot: [id number]   NDC: [address]-8

## 2020-02-29 ENCOUNTER — Emergency Department (HOSPITAL_COMMUNITY)
Admission: EM | Admit: 2020-02-29 | Discharge: 2020-03-01 | Disposition: A | Discharge status: Home / Self Care | Payer: Medicare Other | Attending: Emergency Medicine | Admitting: Emergency Medicine

## 2020-02-29 ENCOUNTER — Other Ambulatory Visit: Payer: Self-pay

## 2020-02-29 DIAGNOSIS — Z20822 Contact with and (suspected) exposure to covid-19: Secondary | ICD-10-CM | POA: Diagnosis not present

## 2020-02-29 DIAGNOSIS — Z79899 Other long term (current) drug therapy: Secondary | ICD-10-CM | POA: Diagnosis not present

## 2020-02-29 DIAGNOSIS — Z96652 Presence of left artificial knee joint: Secondary | ICD-10-CM | POA: Insufficient documentation

## 2020-02-29 DIAGNOSIS — I10 Essential (primary) hypertension: Secondary | ICD-10-CM | POA: Insufficient documentation

## 2020-02-29 DIAGNOSIS — J449 Chronic obstructive pulmonary disease, unspecified: Secondary | ICD-10-CM | POA: Insufficient documentation

## 2020-02-29 DIAGNOSIS — R062 Wheezing: Secondary | ICD-10-CM | POA: Diagnosis present

## 2020-02-29 DIAGNOSIS — J4521 Mild intermittent asthma with (acute) exacerbation: Secondary | ICD-10-CM

## 2020-02-29 MED ORDER — ALBUTEROL SULFATE HFA 108 (90 BASE) MCG/ACT IN AERS
8.0000 | INHALATION_SPRAY | Freq: Once | RESPIRATORY_TRACT | Status: AC
  Administered 2020-02-29: 8 via RESPIRATORY_TRACT
  Filled 2020-02-29: qty 6.7

## 2020-02-29 NOTE — ED Triage Notes (Signed)
Pt c/o asthma attack x3 days. Has been using her inhalers with no relief, also c/o cough.

## 2020-03-01 ENCOUNTER — Emergency Department (HOSPITAL_COMMUNITY): Payer: Medicare Other

## 2020-03-01 ENCOUNTER — Encounter (HOSPITAL_COMMUNITY): Payer: Self-pay | Admitting: Emergency Medicine

## 2020-03-01 LAB — POC SARS CORONAVIRUS 2 AG -  ED: SARS Coronavirus 2 Ag: NEGATIVE

## 2020-03-01 LAB — CBC WITH DIFFERENTIAL/PLATELET
Abs Immature Granulocytes: 0.02 10*3/uL (ref 0.00–0.07)
Basophils Absolute: 0 10*3/uL (ref 0.0–0.1)
Basophils Relative: 1 %
Eosinophils Absolute: 0 10*3/uL (ref 0.0–0.5)
Eosinophils Relative: 1 %
HCT: 39.3 % (ref 36.0–46.0)
Hemoglobin: 12.5 g/dL (ref 12.0–15.0)
Immature Granulocytes: 0 %
Lymphocytes Relative: 30 %
Lymphs Abs: 2 10*3/uL (ref 0.7–4.0)
MCH: 29.3 pg (ref 26.0–34.0)
MCHC: 31.8 g/dL (ref 30.0–36.0)
MCV: 92.3 fL (ref 80.0–100.0)
Monocytes Absolute: 0.5 10*3/uL (ref 0.1–1.0)
Monocytes Relative: 8 %
Neutro Abs: 4 10*3/uL (ref 1.7–7.7)
Neutrophils Relative %: 60 %
Platelets: 269 10*3/uL (ref 150–400)
RBC: 4.26 MIL/uL (ref 3.87–5.11)
RDW: 14.9 % (ref 11.5–15.5)
WBC: 6.6 10*3/uL (ref 4.0–10.5)
nRBC: 0 % (ref 0.0–0.2)

## 2020-03-01 LAB — I-STAT CHEM 8, ED
BUN: 11 mg/dL (ref 8–23)
Calcium, Ion: 1.13 mmol/L — ABNORMAL LOW (ref 1.15–1.40)
Chloride: 101 mmol/L (ref 98–111)
Creatinine, Ser: 0.5 mg/dL (ref 0.44–1.00)
Glucose, Bld: 109 mg/dL — ABNORMAL HIGH (ref 70–99)
HCT: 40 % (ref 36.0–46.0)
Hemoglobin: 13.6 g/dL (ref 12.0–15.0)
Potassium: 3.4 mmol/L — ABNORMAL LOW (ref 3.5–5.1)
Sodium: 142 mmol/L (ref 135–145)
TCO2: 32 mmol/L (ref 22–32)

## 2020-03-01 LAB — TROPONIN I (HIGH SENSITIVITY): Troponin I (High Sensitivity): 5 ng/L (ref <18)

## 2020-03-01 MED ORDER — METHYLPREDNISOLONE SODIUM SUCC 125 MG IJ SOLR
125.0000 mg | Freq: Once | INTRAMUSCULAR | Status: AC
  Administered 2020-03-01: 125 mg via INTRAVENOUS
  Filled 2020-03-01: qty 2

## 2020-03-01 MED ORDER — ALBUTEROL SULFATE HFA 108 (90 BASE) MCG/ACT IN AERS: 1.0000 | INHALATION_SPRAY | Freq: Four times a day (QID) | RESPIRATORY_TRACT | 0 refills | Status: DC | PRN

## 2020-03-01 MED ORDER — PREDNISONE 20 MG PO TABS
ORAL_TABLET | ORAL | 0 refills | Status: DC
Start: 2020-03-01 — End: 2021-03-24

## 2020-03-01 MED ORDER — MAGNESIUM SULFATE 2 GM/50ML IV SOLN
2.0000 g | Freq: Once | INTRAVENOUS | Status: AC
  Administered 2020-03-01: 2 g via INTRAVENOUS
  Filled 2020-03-01: qty 50

## 2020-03-01 NOTE — ED Notes (Signed)
Pt was able to ambulate from room to bathroom with no assistance needed. Pt did not complain of SOB.

## 2020-03-01 NOTE — ED Provider Notes (Signed)
[facility] EMERGENCY DEPARTMENT Provider Note   CSN: [id number] Arrival date & time: 02/29/20  2344     History Chief Complaint  Patient presents with  . Asthma    Colleen [name] is a 69 y.o. female.  The history is provided by the patient.  Wheezing Severity:  Moderate Severity compared to prior episodes:  More severe Onset quality:  Sudden Timing:  Constant Progression:  Unchanged Chronicity:  Recurrent Context: not animal exposure and not emotional upset   Context comment:  URI, covid vaccinated  Relieved by:  Nothing Worsened by:  Nothing Ineffective treatments:  None tried Associated symptoms: no chest pain, no cough, no ear pain, no fatigue, no fever, no foot swelling, no headaches, no orthopnea, no PND, no rash, no rhinorrhea, no sputum production, no stridor and no swollen glands        Past Medical History:  Diagnosis Date  . Acid reflux   . Arthritis   . Asthma   . Chronic hepatitis B (HCC)   . COPD (chronic obstructive pulmonary disease) (HCC)    Mild  . GERD (gastroesophageal reflux disease)   . History of iron deficiency anemia    with pregnancy  . Hypercholesterolemia   . Hypertension   . Numbness and tingling of both upper extremities     Patient Active Problem List   Diagnosis Date Noted  . Hypertension   . COPD (chronic obstructive pulmonary disease) (HCC)   . Primary localized osteoarthritis of left knee 05/24/2019  . GERD (gastroesophageal reflux disease)   . Hypercholesterolemia     Past Surgical History:  Procedure Laterality Date  . BACK SURGERY    . BILATERAL CARPAL TUNNEL RELEASE    . CERVICAL SPINE SURGERY    . KNEE SURGERY Left    x2  . 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     Family History  Problem Relation Age of  Onset  . Cancer Mother   . Cancer Father     Social History   Tobacco Use  . Smoking status: Never Smoker  . Smokeless tobacco: Never Used  Substance Use Topics  . Alcohol use: No    Alcohol/week: 0.0 standard drinks  . Drug use: No    Home Medications Prior to Admission medications   Medication Sig Start Date End Date Taking? Authorizing Provider  albuterol (PROVENTIL HFA) 108 (90 Base) MCG/ACT inhaler Inhale 2 puffs into the lungs every 4 (four) hours as needed for wheezing or shortness of breath.  03/04/15   [provider]  amLODipine (NORVASC) 10 MG tablet Take 10 mg by mouth daily.    [provider]  beclomethasone (QVAR) 80 MCG/ACT inhaler Inhale 2 puffs into the lungs daily.  03/12/15   [provider]  entecavir (BARACLUDE) 0.5 MG tablet Take 0.5 mg by mouth daily.    [provider]  gabapentin (NEURONTIN) 300 MG capsule Take 1 capsule (300 mg total) by mouth 2 (two) times daily. Patient taking differently: Take 300 mg by mouth at bedtime.  01/11/16   Nita Sickle K, DO  Multiple Vitamins-Minerals (CENTRUM SILVER PO) Take 1 tablet by mouth daily.    [provider]  omeprazole (PRILOSEC) 20 MG capsule Take 20 mg by mouth daily.  06/04/15   [provider]  oxyCODONE (OXY IR/ROXICODONE) 5 MG immediate release tablet Take one tab po q4-6hrs prn pain, may need 1-2 first couple weeks 05/24/19   Chadwell, Joshua, PA-C  traMADol (ULTRAM) 50 MG tablet Take 50 mg by mouth every 8 (eight) hours as needed for moderate pain.    [provider]  vitamin E 100 UNIT capsule Take 100 Units by mouth daily.    [provider]    Allergies    Shellfish allergy and Penicillins  Review of Systems   Review of Systems  Constitutional: Negative for fatigue and fever.  HENT: Negative for ear pain and rhinorrhea.   Eyes: Negative for visual disturbance.  Respiratory: Positive for wheezing. Negative for cough, sputum  production and stridor.   Cardiovascular: Negative for chest pain, orthopnea and PND.  Gastrointestinal: Negative for abdominal pain.  Genitourinary: Negative for difficulty urinating.  Musculoskeletal: Negative for arthralgias.  Skin: Negative for rash.  Neurological: Negative for headaches.  Psychiatric/Behavioral: Negative for agitation.  All other systems reviewed and are negative.   Physical Exam Updated Vital Signs BP 115/66   Pulse 79   Temp 98.2 F (36.8 C) (Oral)   Resp 20   Ht 5\' 4"  (1.626 m)   Wt 108.9 kg   SpO2 100%   BMI 41.20 kg/m   Physical Exam Vitals and nursing note reviewed.  Constitutional:      General: She is not in acute distress.    Appearance: Normal appearance.  HENT:     Head: Normocephalic.     Nose: Nose normal.  Eyes:     Conjunctiva/sclera: Conjunctivae normal.     Pupils: Pupils are equal, round, and reactive to light.  Cardiovascular:     Rate and Rhythm: Normal rate and regular rhythm.     Pulses: Normal pulses.     Heart sounds: Normal heart sounds.  Pulmonary:     Breath sounds: Wheezing present.  Abdominal:     General: Abdomen is flat. Bowel sounds are normal.     Tenderness: There is no abdominal tenderness. There is no guarding.  Musculoskeletal:        General: Normal range of motion.     Cervical back: Normal range of motion and neck supple.     Right lower leg: No edema.     Left lower leg: No edema.  Skin:    General: Skin is warm and dry.     Capillary Refill: Capillary refill takes less than 2 seconds.  Neurological:     General: No focal deficit present.     Mental Status: She is alert and oriented to person, place, and time.     Deep Tendon Reflexes: Reflexes normal.  Psychiatric:        Mood and Affect: Mood normal.        Behavior: Behavior normal.     ED Results / Procedures / Treatments   Labs (all labs ordered are listed, but only abnormal results are displayed)  EKG EKG  Interpretation  Date/Time:  Sunday Mar 01 2020 05:26:13 EDT Ventricular Rate:  80 PR Interval:    QRS Duration: 89 QT Interval:  395 QTC Calculation: 456 R Axis:   -41 Text Interpretation: Sinus rhythm Prolonged PR interval Left anterior fascicular block Left ventricular hypertrophy Anterior infarct, old Confirmed by Randal Buba, Adryana Mogensen ([id number]) on 03/01/2020 5:54:52 AM   Radiology DG Chest Portable 1 View  Result Date: 03/01/2020 CLINICAL DATA:  Shortness of breath, asthma attack for 3 days, no relief from inhalers  EXAM: PORTABLE CHEST 1 VIEW COMPARISON:  Radiograph 02/22/2015 FINDINGS: Basilar airways thickening with some increased interstitial opacities. No focal consolidative airspace disease. No pneumothorax or effusion. The cardiomediastinal contours are unremarkable. No acute osseous or soft tissue abnormality. Partial visualization of the inferior extent of the cervical fusion hardware seen on comparison exam. IMPRESSION: Basilar airways thickening with some increased interstitial opacities compatible with acute asthma exacerbation and/or bronchitis. Electronically Signed   By: Kreg Shropshire M.D.   On: 03/01/2020 00:22    Procedures Procedures (including critical care time)  Medications Ordered in ED Medications  albuterol (VENTOLIN HFA) 108 (90 Base) MCG/ACT inhaler 8 puff (8 puffs Inhalation Given 02/29/20 2359)  magnesium sulfate IVPB 2 g 50 mL (0 g Intravenous Stopped 03/01/20 0615)  methylPREDNISolone sodium succinate (SOLU-MEDROL) 125 mg/2 mL injection 125 mg (125 mg Intravenous Given 03/01/20 0537)    ED Course  I have reviewed the triage vital signs and the nursing notes.  Pertinent labs & imaging results that were available during my care of the patient were reviewed by me and considered in my medical decision making (see chart for details).   Markedly improved feels all better.  This is not cardiac.  I do not think there is a bacterial component.  Will start inhalers and  steroids.  Strict return given.    Colleen [name] was evaluated in Emergency Department on 03/01/2020 for the symptoms described in the history of present illness. She was evaluated in the context of the global COVID-19 pandemic, which necessitated consideration that the patient might be at risk for infection with the SARS-CoV-2 virus that causes COVID-19. Institutional protocols and algorithms that pertain to the evaluation of patients at risk for COVID-19 are in a state of rapid change based on information released by regulatory bodies including the CDC and federal and state organizations. These policies and algorithms were followed during the patient's care in the ED.  Final Clinical Impression(s) / ED Diagnoses Return for intractable cough, coughing up blood,fevers >100.4 unrelieved by medication, shortness of breath, intractable vomiting, chest pain, shortness of breath, weakness,numbness, changes in speech, facial asymmetry,abdominal pain, passing out,Inability to tolerate liquids or food, cough, altered mental status or any concerns. No signs of systemic illness or infection. The patient is nontoxic-appearing on exam and vital signs are within normal limits.   I have reviewed the triage vital signs and the nursing notes. Pertinent labs &imaging results that were available during my care of the patient were reviewed by me and considered in my medical decision making (see chart for details).After history, exam, and medical workup I feel the patient has beenappropriately medically screened and is safe for discharge home. Pertinent diagnoses were discussed with the patient. Patient was given return precautions.   Sotirios Navarro, MD 03/01/20 [phone]

## 2020-03-01 NOTE — ED Notes (Signed)
Patient verbalizes understanding of discharge instructions. Opportunity for questioning and answers were provided. Armband removed by staff, pt discharged from ED. Pt. ambulatory and discharged home.  

## 2020-03-01 NOTE — ED Notes (Signed)
Pt ambulated in room on pulse ox. Pt O2 stayed at 100%.. Pt stated that she was SOB while ambulating on room.

## 2020-05-28 ENCOUNTER — Encounter (HOSPITAL_COMMUNITY): Payer: Self-pay | Admitting: Emergency Medicine

## 2020-05-28 ENCOUNTER — Other Ambulatory Visit: Payer: Self-pay

## 2020-05-28 ENCOUNTER — Ambulatory Visit (HOSPITAL_COMMUNITY)
Admission: EM | Admit: 2020-05-28 | Discharge: 2020-05-28 | Disposition: A | Discharge status: Home / Self Care | Payer: Medicare Other | Attending: Physician Assistant | Admitting: Physician Assistant

## 2020-05-28 DIAGNOSIS — J4531 Mild persistent asthma with (acute) exacerbation: Secondary | ICD-10-CM

## 2020-05-28 DIAGNOSIS — R059 Cough, unspecified: Secondary | ICD-10-CM

## 2020-05-28 DIAGNOSIS — R05 Cough: Secondary | ICD-10-CM

## 2020-05-28 MED ORDER — BENZONATATE 100 MG PO CAPS
200.0000 mg | ORAL_CAPSULE | Freq: Three times a day (TID) | ORAL | 0 refills | Status: DC | PRN
Start: 2020-05-28 — End: 2021-03-24

## 2020-05-28 NOTE — ED Triage Notes (Signed)
Pt presents to [facility] for assessment of cough, wheezing x 2 weeks, shortness of breath with exertion, and worse cough when laying down at night.  Patient states the cough has been so bad she has had urinary incontinence.  Patient states she has used her albuterol rescue inhaler 3 times today so far.  This RN notes 1+ pitting edema

## 2020-05-28 NOTE — ED Provider Notes (Signed)
[facility]    CSN: [id number] Arrival date & time: 05/28/20  1821      History   Chief Complaint Chief Complaint  Patient presents with   Shortness of Breath    HPI Colleen [name] is a 69 y.o. female.   Who carries a history of asthma presents with a persistent night cough. Nonproductive. She was seen by her PCP and treated with inhalers and steroids. She feels better overall but the cough is keeping her awake. She feels well during the day though mild wheezing.      Past Medical History:  Diagnosis Date   Acid reflux    Arthritis    Asthma    Chronic hepatitis B (HCC)    COPD (chronic obstructive pulmonary disease) (HCC)    Mild   GERD (gastroesophageal reflux disease)    History of iron deficiency anemia    with pregnancy   Hypercholesterolemia    Hypertension    Numbness and tingling of both upper extremities     Patient Active Problem List   Diagnosis Date Noted   Hypertension    COPD (chronic obstructive pulmonary disease) (HCC)    Primary localized osteoarthritis of left knee 05/24/2019   GERD (gastroesophageal reflux disease)    Hypercholesterolemia     Past Surgical History:  Procedure Laterality Date   BACK SURGERY     BILATERAL CARPAL TUNNEL RELEASE     CERVICAL SPINE SURGERY     KNEE SURGERY Left    x2   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      Home Medications    Prior to Admission medications   Medication Sig Start Date End Date Taking? Authorizing Provider  albuterol (PROVENTIL HFA) 108 (90 Base) MCG/ACT inhaler Inhale 2 puffs into the lungs every 4 (four) hours as needed for wheezing or shortness of breath.  03/04/15   [provider]  albuterol (VENTOLIN HFA) 108 (90 Base) MCG/ACT inhaler Inhale 1-2 puffs  into the lungs every 6 (six) hours as needed for wheezing or shortness of breath. 03/01/20   Palumbo, April, MD  amLODipine (NORVASC) 10 MG tablet Take 10 mg by mouth daily.    [provider]  beclomethasone (QVAR) 80 MCG/ACT inhaler Inhale 2 puffs into the lungs daily.  03/12/15   [provider]  entecavir (BARACLUDE) 0.5 MG tablet Take 0.5 mg by mouth daily.    [provider]  gabapentin (NEURONTIN) 300 MG capsule Take 1 capsule (300 mg total) by mouth 2 (two) times daily. Patient taking differently: Take 300 mg by mouth at bedtime.  01/11/16   Nita Sickle K, DO  Multiple Vitamins-Minerals (CENTRUM SILVER PO) Take 1 tablet by mouth daily.    [provider]  omeprazole (PRILOSEC) 20 MG capsule Take 20 mg by mouth daily.  06/04/15   [provider]  oxyCODONE (OXY IR/ROXICODONE) 5 MG immediate release tablet Take one tab po q4-6hrs prn pain, may need 1-2 first couple weeks 05/24/19   Chadwell, Ivin Booty, PA-C  predniSONE (DELTASONE) 20 MG tablet 3 tabs po day one, then 2 po daily x 4 days 03/01/20   Palumbo, April, MD  traMADol (ULTRAM) 50 MG tablet Take 50 mg by mouth every 8 (eight) hours as needed for moderate pain.    [provider]  vitamin E 100 UNIT capsule Take 100 Units by mouth daily.    [provider]    Family History Family History  Problem Relation Age of Onset   Cancer Mother    Cancer Father     Social History Social History   Tobacco Use   Smoking status: Never Smoker   Smokeless tobacco: Never Used  Building services engineer Use: Never used  Substance Use Topics   Alcohol use: No    Alcohol/week: 0.0 standard drinks   Drug use: No     Allergies   Shellfish allergy and Penicillins   Review of Systems Review of Systems  Constitutional: Negative for fatigue and fever.  Respiratory: Positive for cough, shortness of breath and wheezing.   Skin: Negative.      Physical Exam Triage Vital Signs ED  Triage Vitals  Enc Vitals Group     BP 05/28/20 1953 (!) 131/91     Pulse Rate 05/28/20 1953 92     Resp 05/28/20 1953 18     Temp 05/28/20 1953 98.7 F (37.1 C)     Temp Source 05/28/20 1953 Oral     SpO2 05/28/20 1953 99 %     Weight --      Height --      Head Circumference --      Peak Flow --      Pain Score 05/28/20 1954 0     Pain Loc --      Pain Edu? --      Excl. in GC? --    No data found.  Updated Vital Signs BP (!) 131/91 (BP Location: Left Arm)    Pulse 92    Temp 98.7 F (37.1 C) (Oral)    Resp 18    SpO2 99%   Visual Acuity Right Eye Distance:   Left Eye Distance:   Bilateral Distance:    Right Eye Near:   Left Eye Near:    Bilateral Near:     Physical Exam Vitals and nursing note reviewed.  Constitutional:      General: She is not in acute distress.    Appearance: She is well-developed. She is not ill-appearing.  Cardiovascular:     Rate and Rhythm: Normal rate and regular rhythm.  Pulmonary:     Effort: Pulmonary effort is normal.     Breath sounds: Examination of the right-lower field reveals wheezing. Examination of the left-lower field reveals wheezing. Wheezing present.  Chest:     Chest wall: No deformity.  Skin:    General: Skin is warm and dry.  Neurological:     General: No focal deficit present.     Mental Status: She is alert.  Psychiatric:        Mood and Affect: Mood normal.        Behavior: Behavior normal.      UC Treatments / Results  Labs (all labs ordered are listed, but only abnormal results are displayed) Labs Reviewed - No data to display  EKG   Radiology No results found.  Procedures Procedures (including critical care time)  Medications Ordered in UC Medications - No data to display  Initial Impression / Assessment and Plan / UC Course  I have reviewed the triage vital signs and the nursing notes.  Pertinent labs & imaging results that were available during my care of the patient were reviewed by me  and considered in my medical decision making (see chart for  details).     No indication for an antibiotic. She is s/p steroid and inhaler use. Now with night time cough. Suggest a trial of Ben zonate and hydration. Reasons are given to f/u with PCP if needed.  Final Clinical Impressions(s) / UC Diagnoses   Final diagnoses:  None   Discharge Instructions   None    ED Prescriptions    None     PDMP not reviewed this encounter.   Riki Sheer, PA-C 05/28/20 2014

## 2020-05-28 NOTE — Discharge Instructions (Addendum)
I do not find evidence of infection. Treat with cough medication and push water as well. Continue use of your inhaler as well. It will take some times for the cough to dissipate. IF you start feeling poorly or fever starts then f/u here or with the PCP.

## 2020-08-22 ENCOUNTER — Ambulatory Visit: Payer: Medicare Other | Attending: Internal Medicine

## 2020-08-22 DIAGNOSIS — Z23 Encounter for immunization: Secondary | ICD-10-CM

## 2020-08-22 NOTE — Progress Notes (Signed)
   Covid-19 Vaccination Clinic  Name:  JOHNICA ARMWOOD    MRN: [id number] DOB: 1951/01/14  08/22/2020  Ms. Karim was observed post Covid-19 immunization for 15 minutes without incident. She was provided with Vaccine Information Sheet and instruction to access the V-Safe system.   Ms. Remington was instructed to call 911 with any severe reactions post vaccine: Marland Kitchen Difficulty breathing  . Swelling of face and throat  . A fast heartbeat  . A bad rash all over body  . Dizziness and weakness

## 2020-09-03 ENCOUNTER — Emergency Department (HOSPITAL_COMMUNITY): Payer: Medicare Other

## 2020-09-03 ENCOUNTER — Encounter (HOSPITAL_COMMUNITY): Payer: Self-pay | Admitting: *Deleted

## 2020-09-03 ENCOUNTER — Emergency Department (HOSPITAL_COMMUNITY)
Admission: EM | Admit: 2020-09-03 | Discharge: 2020-09-03 | Disposition: A | Discharge status: Home / Self Care | Payer: Medicare Other | Attending: Emergency Medicine | Admitting: Emergency Medicine

## 2020-09-03 ENCOUNTER — Other Ambulatory Visit: Payer: Self-pay

## 2020-09-03 DIAGNOSIS — R0602 Shortness of breath: Secondary | ICD-10-CM | POA: Insufficient documentation

## 2020-09-03 DIAGNOSIS — Z7951 Long term (current) use of inhaled steroids: Secondary | ICD-10-CM | POA: Insufficient documentation

## 2020-09-03 DIAGNOSIS — R0789 Other chest pain: Secondary | ICD-10-CM | POA: Insufficient documentation

## 2020-09-03 DIAGNOSIS — G8929 Other chronic pain: Secondary | ICD-10-CM | POA: Insufficient documentation

## 2020-09-03 DIAGNOSIS — I10 Essential (primary) hypertension: Secondary | ICD-10-CM | POA: Insufficient documentation

## 2020-09-03 DIAGNOSIS — M549 Dorsalgia, unspecified: Secondary | ICD-10-CM | POA: Insufficient documentation

## 2020-09-03 DIAGNOSIS — Z96652 Presence of left artificial knee joint: Secondary | ICD-10-CM | POA: Insufficient documentation

## 2020-09-03 DIAGNOSIS — M79606 Pain in leg, unspecified: Secondary | ICD-10-CM | POA: Diagnosis not present

## 2020-09-03 DIAGNOSIS — J449 Chronic obstructive pulmonary disease, unspecified: Secondary | ICD-10-CM | POA: Diagnosis not present

## 2020-09-03 DIAGNOSIS — Z79899 Other long term (current) drug therapy: Secondary | ICD-10-CM | POA: Diagnosis not present

## 2020-09-03 DIAGNOSIS — R110 Nausea: Secondary | ICD-10-CM | POA: Diagnosis not present

## 2020-09-03 DIAGNOSIS — R61 Generalized hyperhidrosis: Secondary | ICD-10-CM | POA: Insufficient documentation

## 2020-09-03 LAB — CBC
HCT: 39.3 % (ref 36.0–46.0)
Hemoglobin: 12.6 g/dL (ref 12.0–15.0)
MCH: 29.4 pg (ref 26.0–34.0)
MCHC: 32.1 g/dL (ref 30.0–36.0)
MCV: 91.8 fL (ref 80.0–100.0)
Platelets: 290 10*3/uL (ref 150–400)
RBC: 4.28 MIL/uL (ref 3.87–5.11)
RDW: 14.7 % (ref 11.5–15.5)
WBC: 6.3 10*3/uL (ref 4.0–10.5)
nRBC: 0 % (ref 0.0–0.2)

## 2020-09-03 LAB — BASIC METABOLIC PANEL
Anion gap: 12 (ref 5–15)
BUN: 10 mg/dL (ref 8–23)
CO2: 28 mmol/L (ref 22–32)
Calcium: 9.7 mg/dL (ref 8.9–10.3)
Chloride: 99 mmol/L (ref 98–111)
Creatinine, Ser: 0.61 mg/dL (ref 0.44–1.00)
GFR, Estimated: >60 mL/min (ref >60)
Glucose, Bld: 105 mg/dL — ABNORMAL HIGH (ref 70–99)
Potassium: 3.7 mmol/L (ref 3.5–5.1)
Sodium: 139 mmol/L (ref 135–145)

## 2020-09-03 LAB — TROPONIN I (HIGH SENSITIVITY): Troponin I (High Sensitivity): 5 ng/L (ref <18)

## 2020-09-03 NOTE — ED Triage Notes (Signed)
Pt with intermittent chest pain and sob since Tues that increases with activity.  She came in this am b/c of night sweats.

## 2020-09-03 NOTE — ED Provider Notes (Signed)
[facility]   CSN: [id number] Arrival date & time: 09/03/20  0815    History Chief Complaint  Patient presents with   Chest Pain    Colleen [name] is a 69 y.o. female w/ PMH of asthma, GERD, HLD, chronic hep B and HTN who presents to the ED for an evaluation of CP. Patient states she has had an achy chest pain that comes and goes since Tuesday. The pain radiates to both shoulders, but not her jaws or arms. She had some nausea Tuesday but that went away and also had SOB that resolved after using her albuterol inhaler. 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 She did not lift the patient but had to push and pull him in the wheelchair. She had some diaphoresis last night and chronic back and leg pain but denies any SOB today, N/V, palpitations, headache, dizziness, abd pain, fever/chills or dysuria.     Past Medical History:  Diagnosis Date   Acid reflux    Arthritis    Asthma    Chronic hepatitis B (HCC)    COPD (chronic obstructive pulmonary disease) (HCC)    Mild   GERD (gastroesophageal reflux disease)    History of iron deficiency anemia    with pregnancy   Hypercholesterolemia    Hypertension    Numbness and tingling of both upper extremities     Patient Active Problem List   Diagnosis Date Noted   Hypertension    COPD (chronic obstructive pulmonary disease) (HCC)    Primary localized osteoarthritis of left knee 05/24/2019   GERD (gastroesophageal reflux disease)    Hypercholesterolemia     Past Surgical History:  Procedure Laterality Date   BACK SURGERY     BILATERAL CARPAL TUNNEL RELEASE     CERVICAL SPINE SURGERY     KNEE SURGERY Left    x2   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     Family History  Problem Relation Age of Onset   Cancer Mother    Cancer Father     Social History   Tobacco Use   Smoking status: Never Smoker   Smokeless tobacco: Never Used  Vaping Use   Vaping Use: Never used  Substance Use Topics   Alcohol use: No    Alcohol/week: 0.0 standard drinks   Drug use: No    Home Medications Prior to Admission medications   Medication Sig Start Date End Date Taking? Authorizing Provider  albuterol (PROVENTIL HFA) 108 (90 Base) MCG/ACT inhaler Inhale 2 puffs into the lungs every 4 (four) hours as needed for wheezing or shortness of breath.  03/04/15   [provider]  albuterol (VENTOLIN HFA) 108 (90 Base) MCG/ACT inhaler Inhale 1-2 puffs into the lungs every 6 (six) hours as needed for wheezing or shortness of breath. 03/01/20   Palumbo, April, MD  amLODipine (NORVASC) 10 MG tablet Take 10 mg by mouth daily.    [provider]  beclomethasone (QVAR) 80 MCG/ACT inhaler Inhale 2 puffs into the lungs daily.  03/12/15   [provider]  benzonatate (TESSALON) 100 MG capsule Take 2 capsules (200 mg total) by mouth 3 (three) times daily as needed for cough. 05/28/20   Young,  Dillard Cannon, PA-C  entecavir (BARACLUDE) 0.5 MG tablet Take 0.5 mg by mouth daily.    [provider]  gabapentin (NEURONTIN) 300 MG capsule Take 1 capsule (300 mg total) by mouth 2 (two) times daily. Patient taking differently: Take 300 mg by mouth at bedtime.  01/11/16   Nita Sickle K, DO  Multiple Vitamins-Minerals (CENTRUM SILVER PO) Take 1 tablet by mouth daily.    [provider]  omeprazole (PRILOSEC) 20 MG capsule Take 20 mg by mouth daily.  06/04/15   [provider]  oxyCODONE (OXY IR/ROXICODONE) 5 MG immediate release tablet Take one tab po q4-6hrs prn pain, may need 1-2 first couple weeks 05/24/19   Chadwell, Ivin Booty, PA-C  predniSONE (DELTASONE) 20 MG tablet 3 tabs po day one, then 2 po daily x 4  days 03/01/20   Palumbo, April, MD  traMADol (ULTRAM) 50 MG tablet Take 50 mg by mouth every 8 (eight) hours as needed for moderate pain.    [provider]  vitamin E 100 UNIT capsule Take 100 Units by mouth daily.    [provider]    Allergies    Shellfish allergy and Penicillins  Review of Systems   Review of Systems  Constitutional: Positive for diaphoresis. Negative for chills and fever.  Respiratory: Negative for chest tightness, shortness of breath and wheezing.   Cardiovascular: Positive for chest pain. Negative for palpitations and leg swelling.  Gastrointestinal: Negative for abdominal pain, nausea and vomiting.  Genitourinary: Negative for dysuria.  Musculoskeletal: Positive for arthralgias.  Skin: Negative for rash.  Neurological: Negative for dizziness, weakness and headaches.  Psychiatric/Behavioral: Negative for agitation.    Physical Exam Updated Vital Signs BP 135/77    Pulse 74    Temp 97.8 F (36.6 C) (Oral)    Resp (!) 22    Ht 4\' 11"  (1.499 m)    Wt 108.9 kg    SpO2 99%    BMI 48.47 kg/m   Physical Exam Constitutional:      General: She is not in acute distress.    Appearance: She is well-developed.  HENT:     Head: Normocephalic and atraumatic.  Eyes:     Extraocular Movements: Extraocular movements intact.  Cardiovascular:     Rate and Rhythm: Normal rate and regular rhythm.     Pulses:          Carotid pulses are 2+ on the right side and 2+ on the left side.      Radial pulses are 2+ on the right side and 2+ on the left side.       Dorsalis pedis pulses are 2+ on the right side and 2+ on the left side.       Posterior tibial pulses are 2+ on the right side and 2+ on the left side.     Heart sounds: No murmur heard.  No gallop. No S3 or S4 sounds.   Pulmonary:     Effort: Pulmonary effort is normal. No tachypnea.     Breath sounds: Normal breath sounds. No decreased breath sounds.  Chest:     Chest wall: Tenderness present.      Comments: Mild chest wall tenderness to palpation Abdominal:     General: Bowel sounds are normal.     Palpations: Abdomen is soft.  Musculoskeletal:     Cervical back: Normal range of motion and neck supple.     Right lower leg: No edema.     Left lower leg:  No edema.     Comments: Mild tenderness to palpation around the anterior and deltoid region of the right shoulder.   Skin:    General: Skin is warm and dry.     Capillary Refill: Capillary refill takes less than 2 seconds.  Neurological:     General: No focal deficit present.     Mental Status: She is alert and oriented to person, place, and time.     Motor: No weakness.  Psychiatric:        Mood and Affect: Mood normal.     ED Results / Procedures / Treatments   Labs (all labs ordered are listed, but only abnormal results are displayed) Labs Reviewed  BASIC METABOLIC PANEL - Abnormal; Notable for the following components:      Result Value   Glucose, Bld 105 (*)    All other components within normal limits  CBC  TROPONIN I (HIGH SENSITIVITY)  TROPONIN I (HIGH SENSITIVITY)    EKG EKG Interpretation  Date/Time:  Thursday September 03 2020 08:24:16 EST Ventricular Rate:  79 PR Interval:    QRS Duration: 87 QT Interval:  388 QTC Calculation: 445 R Axis:   -36 Text Interpretation: Sinus rhythm Borderline prolonged PR interval Abnormal R-wave progression, early transition Left ventricular hypertrophy Anterior Q waves, possibly due to LVH ST elevation, consider inferior injury No significant change since last tracing Confirmed by Jacalyn Lefevre [phone]) on 09/03/2020 8:33:17 AM   Radiology DG Chest 2 View  Result Date: 09/03/2020 CLINICAL DATA:  69 year old female with chest pain and shortness of breath. EXAM: CHEST - 2 VIEW COMPARISON:  03/01/2020 FINDINGS: The heart size and mediastinal contours are within normal limits. Similar appearing mild elevation of the left hemidiaphragm. Both lungs are clear. Lower  cervical fusion hardware in place. Multilevel degenerative changes of the thoracic spine. No acute osseous abnormality. IMPRESSION: No active cardiopulmonary disease. Electronically Signed   By: Marliss Coots MD   On: 09/03/2020 09:10    Procedures Procedures (including critical care time)  Medications Ordered in ED Medications - No data to display  ED Course  I have reviewed the triage vital signs and the nursing notes.  Pertinent labs & imaging results that were available during my care of the patient were reviewed by me and considered in my medical decision making (see chart for details).  Clinical Course as of Sep 03 1005  Thu Sep 03, 2020  6045 Troponin I (High Sensitivity) [PA]    Clinical Course User Index [PA] Steffanie Rainwater, MD   MDM Rules/Calculators/A&P                          69 yo F w/ hx of asthma and GERD here for an evaluation of 2 days of chest discomfort. Pain is intermittent and achy in nature that radiate to her shoulder. Endorse some diaphoresis last night but denies SOB, nausea, palpitations, or lower extremities swelling. On exam, there is mild ttp on the anterior chest wall and R shoulder. Hemodynamically stable with HR in the 70-8s and SpO2 above 95%. Troponin neg. CBC and BMP unremarkable. EKG with sinus rhythm and some ST changes that is unchanged from previous EKG. CXR with no acute cardiopulmonary disease. Presentation not ACS-related or pulmonary pathology. 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 Discharging home with return precautions.    Final Clinical Impression(s) / ED Diagnoses Final diagnoses:  Musculoskeletal chest  pain    Rx / DC Orders ED Discharge Orders    None       Steffanie Rainwater, MD 09/03/20 1048    Jacalyn Lefevre, MD 09/03/20 1131

## 2020-09-03 NOTE — ED Notes (Signed)
Patient verbalizes understanding of discharge instructions. Opportunity for questioning and answers were provided. Arm band removed by staff, patient discharged from ED. 

## 2020-09-03 NOTE — Discharge Instructions (Addendum)
Ms. Weikel, thank you for visiting [facility] ED. You were evaluated for your chest pain. Based on our assessment and findings, we do not believe this is related to your heart. The chest pain seem more musculoskeletal so we advice that you rest over the weekend and take some Ibuprofen or tylenol if needed if it comes back. You can also apply ice to your shoulder to help relieve the pain. If the pain worsens and you started having nausea, sweats and other symptoms, come back to the ER.

## 2021-03-24 ENCOUNTER — Ambulatory Visit (HOSPITAL_COMMUNITY)
Admission: EM | Admit: 2021-03-24 | Discharge: 2021-03-24 | Disposition: A | Discharge status: Home / Self Care | Payer: Medicare Other | Attending: Medical Oncology | Admitting: Medical Oncology

## 2021-03-24 ENCOUNTER — Encounter (HOSPITAL_COMMUNITY): Payer: Self-pay | Admitting: Emergency Medicine

## 2021-03-24 ENCOUNTER — Other Ambulatory Visit: Payer: Self-pay

## 2021-03-24 DIAGNOSIS — R0981 Nasal congestion: Secondary | ICD-10-CM | POA: Diagnosis not present

## 2021-03-24 DIAGNOSIS — R058 Other specified cough: Secondary | ICD-10-CM | POA: Diagnosis present

## 2021-03-24 DIAGNOSIS — J069 Acute upper respiratory infection, unspecified: Secondary | ICD-10-CM | POA: Diagnosis not present

## 2021-03-24 DIAGNOSIS — Z880 Allergy status to penicillin: Secondary | ICD-10-CM | POA: Diagnosis not present

## 2021-03-24 DIAGNOSIS — Z20822 Contact with and (suspected) exposure to covid-19: Secondary | ICD-10-CM | POA: Diagnosis not present

## 2021-03-24 DIAGNOSIS — J441 Chronic obstructive pulmonary disease with (acute) exacerbation: Secondary | ICD-10-CM | POA: Diagnosis not present

## 2021-03-24 LAB — SARS CORONAVIRUS 2 (TAT 6-24 HRS): SARS Coronavirus 2: NEGATIVE

## 2021-03-24 MED ORDER — FLUTICASONE PROPIONATE 50 MCG/ACT NA SUSP
2.0000 | Freq: Every day | NASAL | 0 refills | Status: DC
Start: 2021-03-24 — End: 2023-09-28

## 2021-03-24 MED ORDER — PREDNISONE 10 MG (21) PO TBPK
ORAL_TABLET | Freq: Every day | ORAL | 0 refills | Status: DC
Start: 2021-03-24 — End: 2021-11-20

## 2021-03-24 MED ORDER — ALBUTEROL SULFATE HFA 108 (90 BASE) MCG/ACT IN AERS: 2.0000 | INHALATION_SPRAY | RESPIRATORY_TRACT | 0 refills | Status: DC | PRN

## 2021-03-24 MED ORDER — BENZONATATE 100 MG PO CAPS
200.0000 mg | ORAL_CAPSULE | Freq: Three times a day (TID) | ORAL | 0 refills | Status: DC | PRN
Start: 2021-03-24 — End: 2021-11-20

## 2021-03-24 NOTE — ED Provider Notes (Addendum)
[facility]    CSN: [id number] Arrival date & time: 03/24/21  3762      History   Chief Complaint Chief Complaint  Patient presents with   Chills   Cough   Ear Fullness    Left     HPI Colleen [name] is a 70 y.o. female.   HPI  COPD exacerbation: Patient reports that she has had a COPD exacerbation for the past 3 days.  Her daughter is also sick with an unknown virus.  She reports a mostly dry but occasionally productive cough with green sputum, chills, ear pressure, nasal congestion.  She has tried over-the-counter cough syrup without much success.  She denies any chest pain, significant shortness of breath, known fevers but has had some wheezing.  Past Medical History:  Diagnosis Date   Acid reflux    Arthritis    Asthma    Chronic hepatitis B (HCC)    COPD (chronic obstructive pulmonary disease) (HCC)    Mild   GERD (gastroesophageal reflux disease)    History of iron deficiency anemia    with pregnancy   Hypercholesterolemia    Hypertension    Numbness and tingling of both upper extremities     Patient Active Problem List   Diagnosis Date Noted   Hypertension    COPD (chronic obstructive pulmonary disease) (HCC)    Primary localized osteoarthritis of left knee 05/24/2019   GERD (gastroesophageal reflux disease)    Hypercholesterolemia     Past Surgical History:  Procedure Laterality Date   BACK SURGERY     BILATERAL CARPAL TUNNEL RELEASE     CERVICAL SPINE SURGERY     KNEE SURGERY Left    x2   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      Home Medications    Prior to Admission medications   Medication Sig Start Date End Date Taking? Authorizing Provider  albuterol (PROVENTIL HFA) 108 (90 Base) MCG/ACT inhaler Inhale 2 puffs into the lungs every 4 (four)  hours as needed for wheezing or shortness of breath.  03/04/15   [provider]  albuterol (VENTOLIN HFA) 108 (90 Base) MCG/ACT inhaler Inhale 1-2 puffs into the lungs every 6 (six) hours as needed for wheezing or shortness of breath. 03/01/20   Palumbo, April, MD  amLODipine (NORVASC) 10 MG tablet Take 10 mg by mouth daily.    [provider]  beclomethasone (QVAR) 80 MCG/ACT inhaler Inhale 2 puffs into the lungs daily.  03/12/15   [provider]  benzonatate (TESSALON) 100 MG capsule Take 2 capsules (200 mg total) by mouth 3 (three) times daily as needed for cough. 05/28/20   Riki Sheer, PA-C  entecavir (BARACLUDE) 0.5 MG tablet Take 0.5 mg by mouth daily.    [provider]  gabapentin (NEURONTIN) 300 MG capsule Take 1 capsule (300 mg total) by mouth 2 (two) times daily. Patient taking differently: Take 300 mg by mouth at bedtime.  01/11/16   Nita Sickle K, DO  Multiple Vitamins-Minerals (CENTRUM SILVER PO) Take 1 tablet by mouth daily.    [provider]  omeprazole (PRILOSEC) 20 MG capsule Take 20 mg by mouth daily.  06/04/15   [provider]  oxyCODONE (OXY IR/ROXICODONE) 5 MG immediate release tablet Take one tab po q4-6hrs prn  pain, may need 1-2 first couple weeks 05/24/19   Chadwell, Ivin Booty, PA-C  predniSONE (DELTASONE) 20 MG tablet 3 tabs po day one, then 2 po daily x 4 days 03/01/20   Palumbo, April, MD  traMADol (ULTRAM) 50 MG tablet Take 50 mg by mouth every 8 (eight) hours as needed for moderate pain.    [provider]  vitamin E 100 UNIT capsule Take 100 Units by mouth daily.    [provider]    Family History Family History  Problem Relation Age of Onset   Cancer Mother    Cancer Father     Social History Social History   Tobacco Use   Smoking status: Never   Smokeless tobacco: Never  Vaping Use   Vaping Use: Never used  Substance Use Topics   Alcohol use: No    Alcohol/week: 0.0 standard  drinks   Drug use: No     Allergies   Shellfish allergy and Penicillins   Review of Systems Review of Systems  As stated above in HPI Physical Exam Triage Vital Signs ED Triage Vitals [03/24/21 0818]  Enc Vitals Group     BP (!) 151/79     Pulse Rate 87     Resp 18     Temp 99.5 F (37.5 C)     Temp Source Oral     SpO2 99 %     Weight      Height      Head Circumference      Peak Flow      Pain Score      Pain Loc      Pain Edu?      Excl. in GC?    No data found.  Updated Vital Signs BP (!) 151/79 (BP Location: Left Arm)   Pulse 87   Temp 99.5 F (37.5 C) (Oral)   Resp 18   SpO2 99%   Physical Exam Vitals and nursing note reviewed.  Constitutional:      General: She is not in acute distress.    Appearance: Normal appearance. She is obese. She is not ill-appearing or toxic-appearing.  HENT:     Head: Normocephalic and atraumatic.     Right Ear: Tympanic membrane, ear canal and external ear normal.     Left Ear: Tympanic membrane, ear canal and external ear normal.     Nose: Congestion and rhinorrhea present.     Mouth/Throat:     Mouth: Mucous membranes are moist.     Pharynx: Oropharynx is clear. No oropharyngeal exudate or posterior oropharyngeal erythema.  Eyes:     Extraocular Movements: Extraocular movements intact.     Pupils: Pupils are equal, round, and reactive to light.  Cardiovascular:     Rate and Rhythm: Normal rate and regular rhythm.     Heart sounds: Normal heart sounds.  Pulmonary:     Effort: Pulmonary effort is normal.     Breath sounds: Wheezing (mild throughout) present.  Musculoskeletal:     Cervical back: Normal range of motion and neck supple.  Lymphadenopathy:     Cervical: No cervical adenopathy.  Skin:    General: Skin is warm.  Neurological:     Mental Status: She is alert and oriented to person, place, and time.     UC Treatments / Results  Labs (all labs ordered are listed, but only abnormal results are  displayed) Labs Reviewed - No data to display  EKG   Radiology No results found.  Procedures Procedures (including critical care time)  Medications Ordered in UC Medications - No data to display  Initial Impression / Assessment and Plan / UC Course  I have reviewed the triage vital signs and the nursing notes.  Pertinent labs & imaging results that were available during my care of the patient were reviewed by me and considered in my medical decision making (see chart for details).     New.  Treating for COPD exacerbation with prednisone, albuterol, Flonase and Tessalon to prevent further systemic illness and complication. NO ABX at this time given non-smoking status.  She reports that she typically does well with these medications in the past but we reviewed how to take.  We discussed rest and hydration with water.  Discussed red flag signs and symptoms. Follow up PRN.  Final Clinical Impressions(s) / UC Diagnoses   Final diagnoses:  None   Discharge Instructions   None    ED Prescriptions   None    PDMP not reviewed this encounter.   Rushie Chestnut, PA-C 03/24/21 0834    Rushie Chestnut, PA-C 03/24/21 [phone]

## 2021-03-24 NOTE — ED Triage Notes (Signed)
Pt is present with left ear fullness, wheezing, sore throat, cough, and chills. Pt states that her sx started three days ago.

## 2021-10-08 ENCOUNTER — Other Ambulatory Visit: Payer: Self-pay

## 2021-10-08 ENCOUNTER — Encounter (HOSPITAL_COMMUNITY): Payer: Self-pay | Admitting: *Deleted

## 2021-10-08 ENCOUNTER — Ambulatory Visit (HOSPITAL_COMMUNITY)
Admission: EM | Admit: 2021-10-08 | Discharge: 2021-10-08 | Disposition: A | Discharge status: Home / Self Care | Payer: Medicare Other

## 2021-10-08 DIAGNOSIS — M791 Myalgia, unspecified site: Secondary | ICD-10-CM | POA: Diagnosis not present

## 2021-10-08 NOTE — Discharge Instructions (Signed)
TYlenol for discomfort
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
## 2021-10-08 NOTE — ED Triage Notes (Signed)
Pt reports pain to back of head and Lt side.
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
## 2021-11-20 ENCOUNTER — Ambulatory Visit (HOSPITAL_COMMUNITY)
Admission: EM | Admit: 2021-11-20 | Discharge: 2021-11-20 | Disposition: A | Discharge status: Home / Self Care | Payer: Medicare Other | Attending: Urgent Care | Admitting: Urgent Care

## 2021-11-20 ENCOUNTER — Encounter (HOSPITAL_COMMUNITY): Payer: Self-pay | Admitting: Emergency Medicine

## 2021-11-20 ENCOUNTER — Other Ambulatory Visit: Payer: Self-pay

## 2021-11-20 ENCOUNTER — Ambulatory Visit (INDEPENDENT_AMBULATORY_CARE_PROVIDER_SITE_OTHER): Payer: Medicare Other

## 2021-11-20 DIAGNOSIS — M7062 Trochanteric bursitis, left hip: Secondary | ICD-10-CM

## 2021-11-20 DIAGNOSIS — M545 Low back pain, unspecified: Secondary | ICD-10-CM

## 2021-11-20 DIAGNOSIS — M159 Polyosteoarthritis, unspecified: Secondary | ICD-10-CM | POA: Diagnosis not present

## 2021-11-20 MED ORDER — MELOXICAM 7.5 MG PO TABS: 7.5000 mg | ORAL_TABLET | Freq: Every day | ORAL | 0 refills | Status: DC

## 2021-11-20 NOTE — Discharge Instructions (Signed)
Your symptoms are most likely due to trochanteric bursitis.  This is inflammation around a bone in your hip. Your L-spine x-ray does not reveal any acute abnormalities concerning for an acute abnormality. Try to avoid putting pressure on the side of your left hip. Please try the exercises in the attached paperwork. Take the anti-inflammatory every morning with food. If symptoms persist please follow-up with an orthopedic doctor, sometimes these require an injection.

## 2021-11-20 NOTE — ED Provider Notes (Signed)
[facility]    CSN: [id number] Arrival date & time: 11/20/21  1020      History   Chief Complaint Chief Complaint  Patient presents with   Hip Pain    HPI Colleen [name] is a 71 y.o. female.   Pleasant 71 year old female presents today due to a 3-day history of left hip pain.  She reports having had a left total knee replacement 2 years ago, but has not had any hip issues.  3 days ago however she started having severe pain.  She denies any injury or trauma.  She states she actively works out at [employer], and when she is in the pool she has no pain at all.  She reports the pain being located primarily to the area of her greater trochanter, but states it does shoot to her back and also to her groin.  Very intermittently she does get anterior thigh pain, but denies any radicular symptoms past her knee.  She does have a history of lumbar spine issues, and had a surgery to her L1 disc 20 years ago.  She describes the pain as being sharp in nature, and did respond minimally to ibuprofen.  She states however she was not able to take much ibuprofen as she has chronic medical conditions that prevent her from taking this routinely.  She states the pain is better when sitting, and much worse when moving.  She also states that nighttime makes it worse.  She admits to primarily laying on her left side.  She denies any bowel or bladder incontinence.  She denies any saddle anesthesia.   Hip Pain   Past Medical History:  Diagnosis Date   Acid reflux    Arthritis    Asthma    Chronic hepatitis B (HCC)    COPD (chronic obstructive pulmonary disease) (HCC)    Mild   GERD (gastroesophageal reflux disease)    History of iron deficiency anemia    with pregnancy   Hypercholesterolemia    Hypertension    Numbness and tingling of both upper extremities     Patient Active Problem List   Diagnosis Date Noted   Hypertension    COPD (chronic obstructive pulmonary disease) ([place])     Primary localized osteoarthritis of left knee 05/24/2019   GERD (gastroesophageal reflux disease)    Hypercholesterolemia     Past Surgical History:  Procedure Laterality Date   BACK SURGERY     BILATERAL CARPAL TUNNEL RELEASE     CERVICAL SPINE SURGERY     KNEE SURGERY Left    x2   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      Home Medications    Prior to Admission medications   Medication Sig Start Date End Date Taking? Authorizing Provider  meloxicam (MOBIC) 7.5 MG tablet Take 1 tablet (7.5 mg total) by mouth daily. 11/20/21  Yes Guyla Bless L, PA  albuterol (PROVENTIL HFA) 108 (90 Base) MCG/ACT inhaler Inhale 2 puffs into the lungs every 4 (four) hours as needed for wheezing or shortness of breath. 03/24/21   Hughie Closs, PA-C  albuterol (VENTOLIN HFA) 108 (90 Base) MCG/ACT inhaler Inhale 1-2 puffs into the lungs every 6 (six) hours as needed for wheezing or shortness of breath. 03/01/20   Palumbo, April, MD  amLODipine (  NORVASC) 10 MG tablet Take 10 mg by mouth daily.    [provider]  beclomethasone (QVAR) 80 MCG/ACT inhaler Inhale 2 puffs into the lungs daily.  03/12/15   [provider]  entecavir (BARACLUDE) 0.5 MG tablet Take 0.5 mg by mouth daily.    [provider]  fluticasone (FLONASE) 50 MCG/ACT nasal spray Place 2 sprays into both nostrils daily. 03/24/21   Hughie Closs, PA-C  gabapentin (NEURONTIN) 300 MG capsule Take 1 capsule (300 mg total) by mouth 2 (two) times daily. Patient taking differently: Take 300 mg by mouth at bedtime.  01/11/16   Narda Amber K, DO  Multiple Vitamins-Minerals (CENTRUM SILVER PO) Take 1 tablet by mouth daily.    [provider]  omeprazole (PRILOSEC) 20 MG capsule Take 20 mg by mouth daily.  06/04/15   [provider]  vitamin E 100 UNIT capsule Take 100 Units by mouth daily.    [provider]    Family History Family History  Problem Relation Age of Onset   Cancer Mother    Cancer Father     Social History Social History   Tobacco Use   Smoking status: Never   Smokeless tobacco: Never  Vaping Use   Vaping Use: Never used  Substance Use Topics   Alcohol use: No    Alcohol/week: 0.0 standard drinks   Drug use: No     Allergies   Shellfish allergy and Penicillins   Review of Systems Review of Systems  Musculoskeletal:  Positive for arthralgias, back pain, gait problem and myalgias.  All other systems reviewed and are negative.   Physical Exam Triage Vital Signs ED Triage Vitals  Enc Vitals Group     BP 11/20/21 1039 (!) 163/113     Pulse Rate 11/20/21 1039 76     Resp 11/20/21 1039 20     Temp 11/20/21 1039 98.8 F (37.1 C)     Temp Source 11/20/21 1039 Oral     SpO2 11/20/21 1039 96 %     Weight 11/20/21 1037 240 lb 1.3 oz (108.9 kg)     Height 11/20/21 1037 4\' 11"  (1.499 m)     Head Circumference --      Peak Flow --      Pain Score 11/20/21 1036 9     Pain Loc --      Pain Edu? --      Excl. in [place]? --    No data found.  Updated Vital Signs BP (!) 163/113 (BP Location: Right Arm)    Pulse 76    Temp 98.8 F (37.1 C) (Oral)    Resp 20    Ht 4\' 11"  (1.499 m)    Wt 240 lb 1.3 oz (108.9 kg)    SpO2 96%    BMI 48.49 kg/m   Visual Acuity Right Eye Distance:   Left Eye Distance:   Bilateral Distance:    Right Eye Near:   Left Eye Near:    Bilateral Near:     Physical Exam Vitals and nursing note reviewed.  Constitutional:      General: She is not in acute distress.    Appearance: Normal appearance. She is well-developed. She is obese. She is not ill-appearing, toxic-appearing or diaphoretic.  HENT:     Head: Normocephalic and atraumatic.  Eyes:     Conjunctiva/sclera: Conjunctivae normal.  Cardiovascular:     Rate and Rhythm:  Normal rate and regular rhythm.  Heart sounds: No murmur heard. Pulmonary:     Effort: Pulmonary effort is normal. No respiratory distress.     Breath sounds: Normal breath sounds.  Abdominal:     Palpations: Abdomen is soft.     Tenderness: There is no abdominal tenderness.  Musculoskeletal:        General: No swelling.     Cervical back: Neck supple. No rigidity.     Right hip: Normal. No deformity, lacerations, tenderness, bony tenderness or crepitus. Normal range of motion. Normal strength.     Left hip: Tenderness and bony tenderness (to L greater trochanter) present. No deformity or crepitus. Normal range of motion (FROM, however "pulling" and painful with doing so). Normal strength.     Right upper leg: Normal. No swelling, edema, deformity, lacerations, tenderness or bony tenderness.     Left upper leg: Normal. No swelling, edema, deformity, lacerations, tenderness or bony tenderness.     Right ankle: Normal. No tenderness. Normal range of motion. Normal pulse.     Left ankle: Normal. No tenderness. Normal range of motion. Normal pulse.     Right foot: Normal range of motion. Normal pulse.     Left foot: Normal range of motion. Normal pulse.       Legs:     Comments: NEGATIVE for both seated and supine straight leg raise bilaterally  Skin:    General: Skin is warm and dry.     Capillary Refill: Capillary refill takes less than 2 seconds.  Neurological:     Mental Status: She is alert.  Psychiatric:        Mood and Affect: Mood normal.     UC Treatments / Results  Labs (all labs ordered are listed, but only abnormal results are displayed) Labs Reviewed - No data to display  EKG   Radiology DG Lumbar Spine Complete  Result Date: 11/20/2021 CLINICAL DATA:  71 year old female with history of low back pain with extension into the right hip. EXAM: LUMBAR SPINE - COMPLETE 4+ VIEW COMPARISON:  Lumbar spine radiograph 11/18/2016. FINDINGS: Five views of the lumbar spine  demonstrate no acute displaced fracture or definite compression type fracture. Multilevel degenerative disc disease, most evident at L4-L5 and L5-S1. Multilevel facet arthropathy, also most severe at L4-L5 and L5-S1. IMPRESSION: 1. No acute radiographic abnormality of the lumbar spine. 2. Progressive multilevel degenerative disc disease and lumbar spondylosis, as above. Electronically Signed   By: Trudie Reed M.D.   On: 11/20/2021 12:24    Procedures Procedures (including critical care time)  Medications Ordered in UC Medications - No data to display  Initial Impression / Assessment and Plan / UC Course  I have reviewed the triage vital signs and the nursing notes.  Pertinent labs & imaging results that were available during my care of the patient were reviewed by me and considered in my medical decision making (see chart for details).     Trochanteric bursitis - will do NSAIDs x 14 days, exercises and avoid pressure to the L hip. If sx continue, consider ortho follow up for injection. Pt has no contraindication to NSAIDs Osteoarthritis - L spine xray ordered due to hx of L1 disc issues and pt description of possible L2 radicular symptoms. Exam benign and xray showing no acute abnormalities. F/U  with ortho/ PCP if worsening sx.   Final Clinical Impressions(s) / UC Diagnoses   Final diagnoses:  Trochanteric bursitis of left hip  Osteoarthritis, generalized     Discharge Instructions  Your symptoms are most likely due to trochanteric bursitis.  This is inflammation around a bone in your hip. Your L-spine x-ray does not reveal any acute abnormalities concerning for an acute abnormality. Try to avoid putting pressure on the side of your left hip. Please try the exercises in the attached paperwork. Take the anti-inflammatory every morning with food. If symptoms persist please follow-up with an orthopedic doctor, sometimes these require an injection.     ED Prescriptions      Medication Sig Dispense Auth. Provider   meloxicam (MOBIC) 7.5 MG tablet Take 1 tablet (7.5 mg total) by mouth daily. 14 tablet Zyann Mabry L, [place]      PDMP not reviewed this encounter.   Chaney Malling, [place] 11/20/21 1244

## 2021-11-20 NOTE — ED Triage Notes (Signed)
Pt reports right hip pain x 3 days. Denies any obvious injuries.

## 2022-02-18 ENCOUNTER — Ambulatory Visit (HOSPITAL_COMMUNITY)
Admission: EM | Admit: 2022-02-18 | Discharge: 2022-02-18 | Disposition: A | Discharge status: Home / Self Care | Payer: Medicare Other | Attending: Internal Medicine | Admitting: Internal Medicine

## 2022-02-18 ENCOUNTER — Encounter (HOSPITAL_COMMUNITY): Payer: Self-pay | Admitting: Emergency Medicine

## 2022-02-18 DIAGNOSIS — T781XXA Other adverse food reactions, not elsewhere classified, initial encounter: Secondary | ICD-10-CM

## 2022-02-18 MED ORDER — FAMOTIDINE 20 MG PO TABS: 20.0000 mg | ORAL_TABLET | Freq: Two times a day (BID) | ORAL | 0 refills | Status: AC

## 2022-02-18 MED ORDER — HYDROXYZINE HCL 25 MG PO TABS: 25.0000 mg | ORAL_TABLET | Freq: Four times a day (QID) | ORAL | 0 refills | Status: AC

## 2022-02-18 MED ORDER — PREDNISONE 20 MG PO TABS: 40.0000 mg | ORAL_TABLET | Freq: Every day | ORAL | 0 refills | Status: AC

## 2022-02-18 NOTE — ED Triage Notes (Signed)
Pt reports last night having itching and tingling to face and mouth as well hands itching. Took benadryl last night but none today.  ?

## 2022-02-18 NOTE — ED Provider Notes (Signed)
?[facility] ? ? ? ?CSN: [id number] ?Arrival date & time: 02/18/22  0947 ? ? ?  ? ?History   ?Chief Complaint ?Chief Complaint  ?Patient presents with  ? Allergic Reaction  ? ? ?HPI ?Colleen [name] is a 71 y.o. 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  Patient denies eating any shrimp.  Swelling and itching has been persistent in spite of Benadryl use.  No tongue swelling.  No difficulty swallowing.  No chest tightness or shortness of breath..  ? ?HPI ? ?Past Medical History:  ?Diagnosis Date  ? Acid reflux   ? Arthritis   ? Asthma   ? Chronic hepatitis B (HCC)   ? COPD (chronic obstructive pulmonary disease) (HCC)   ? Mild  ? GERD (gastroesophageal reflux disease)   ? History of iron deficiency anemia   ? with pregnancy  ? Hypercholesterolemia   ? Hypertension   ? Numbness and tingling of both upper extremities   ? ? ?Patient Active Problem List  ? Diagnosis Date Noted  ? Hypertension   ? COPD (chronic obstructive pulmonary disease) (HCC)   ? Primary localized osteoarthritis of left knee 05/24/2019  ? GERD (gastroesophageal reflux disease)   ? Hypercholesterolemia   ? ? ?Past Surgical History:  ?Procedure Laterality Date  ? BACK SURGERY    ? BILATERAL CARPAL TUNNEL RELEASE    ? CERVICAL SPINE SURGERY    ? KNEE SURGERY Left   ? x2  ? 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 Procedure: TOTAL KNEE ARTHROPLASTY;  Surgeon: Frederico Hammanaffrey, Daniel, MD;  Location: [facility];  Service: Orthopedics;  Laterality: Left;  ? ? ?OB History   ?No obstetric history on file. ?  ? ? ? ?Home Medications   ? ?Prior to Admission medications   ?Medication Sig Start Date End Date Taking? Authorizing Provider  ?famotidine (PEPCID) 20 MG tablet Take 1 tablet (20 mg total) by mouth 2 (two) times daily for 3 days. 02/18/22 02/21/22 Yes Marleah Beever, Britta MccreedyPhilip O, MD  ?hydrOXYzine (ATARAX) 25 MG tablet Take 1 tablet (25 mg total) by mouth every  6 (six) hours. 02/18/22  Yes Demetrice Amstutz, Britta MccreedyPhilip O, MD  ?predniSONE (DELTASONE) 20 MG tablet Take 2 tablets (40 mg total) by mouth daily for 3 days. 02/18/22 02/21/22 Yes Madlyn Crosby, Britta MccreedyPhilip O, MD  ?albuterol (PROVENTIL HFA) 108 (90 Base) MCG/ACT inhaler Inhale 2 puffs into the lungs every 4 (four) hours as needed for wheezing or shortness of breath. 03/24/21   Rushie Chestnutovington, Sarah M, PA-C  ?albuterol (VENTOLIN HFA) 108 (90 Base) MCG/ACT inhaler Inhale 1-2 puffs into the lungs every 6 (six) hours as needed for wheezing or shortness of breath. 03/01/20   Palumbo, April, MD  ?amLODipine (NORVASC) 10 MG tablet Take 10 mg by mouth daily.    [provider]  ?beclomethasone (QVAR) 80 MCG/ACT inhaler Inhale 2 puffs into the lungs daily.  03/12/15   [provider]  ?entecavir (BARACLUDE) 0.5 MG tablet Take 0.5 mg by mouth daily.    [provider]  ?fluticasone (FLONASE) 50 MCG/ACT nasal spray Place 2 sprays into both nostrils daily. 03/24/21   Rushie Chestnutovington, Sarah M, PA-C  ?gabapentin (NEURONTIN) 300 MG capsule Take 1 capsule (300 mg total) by mouth 2 (two) times daily. ?Patient taking differently: Take 300 mg by mouth at bedtime.  01/11/16   Nita SicklePatel, Donika K, DO  ?meloxicam (MOBIC) 7.5 MG tablet Take  1 tablet (7.5 mg total) by mouth daily. 11/20/21   Maretta Bees, PA  ?Multiple Vitamins-Minerals (CENTRUM SILVER PO) Take 1 tablet by mouth daily.    [provider]  ?omeprazole (PRILOSEC) 20 MG capsule Take 20 mg by mouth daily.  06/04/15   [provider]  ?vitamin E 100 UNIT capsule Take 100 Units by mouth daily.    [provider]  ? ? ?Family History ?Family History  ?Problem Relation Age of Onset  ? Cancer Mother   ? Cancer Father   ? ? ?Social History ?Social History  ? ?Tobacco Use  ? Smoking status: Never  ? Smokeless tobacco: Never  ?Vaping Use  ? Vaping Use: Never used  ?Substance Use Topics  ? Alcohol use: No  ?  Alcohol/week: 0.0 standard drinks  ? Drug use: No  ? ? ? ?Allergies    ?Shellfish allergy and Penicillins ? ? ?Review of Systems ?Review of Systems  ?Musculoskeletal: Negative.   ?Skin:  Positive for color change. Negative for rash and wound.  ? ? ?Physical Exam ?Triage Vital Signs ?ED Triage Vitals  ?Enc Vitals Group  ?   BP 02/18/22 1038 (!) 143/93  ?   Pulse Rate 02/18/22 1038 69  ?   Resp 02/18/22 1038 18  ?   Temp 02/18/22 1038 98.5 ?F (36.9 ?C)  ?   Temp src --   ?   SpO2 02/18/22 1038 95 %  ?   Weight --   ?   Height --   ?   Head Circumference --   ?   Peak Flow --   ?   Pain Score 02/18/22 1036 10  ?   Pain Loc --   ?   Pain Edu? --   ?   Excl. in GC? --   ? ?No data found. ? ?Updated Vital Signs ?BP (!) 143/93 (BP Location: Left Arm)   Pulse 69   Temp 98.5 ?F (36.9 ?C)   Resp 18   SpO2 95%  ? ?Visual Acuity ?Right Eye Distance:   ?Left Eye Distance:   ?Bilateral Distance:   ? ?Right Eye Near:   ?Left Eye Near:    ?Bilateral Near:    ? ?Physical Exam ?Vitals and nursing note reviewed.  ?Constitutional:   ?   Appearance: Normal appearance.  ?HENT:  ?   Right Ear: Tympanic membrane normal.  ?   Left Ear: Tympanic membrane normal.  ?   Nose: Nose normal.  ?   Mouth/Throat:  ?   Mouth: Mucous membranes are moist.  ?   Comments: No tongue swelling.  Uvula is visible. ?Cardiovascular:  ?   Rate and Rhythm: Normal rate and regular rhythm.  ?Pulmonary:  ?   Effort: Pulmonary effort is normal.  ?   Breath sounds: Normal breath sounds.  ?Abdominal:  ?   General: Bowel sounds are normal.  ?   Palpations: Abdomen is soft.  ?Skin: ?   General: Skin is warm.  ?   Comments: Both upper and lower lips are swollen especially on the right side of the mouth.  Mild overlying erythema.  ?Neurological:  ?   Mental Status: She is alert.  ? ? ? ?UC Treatments / Results  ?Labs ?(all labs ordered are listed, but only abnormal results are displayed) ?Labs Reviewed - No data to display ? ?EKG ? ? ?Radiology ?No results found. ? ?Procedures ?Procedures (including critical care time) ? ?Medications  Ordered in UC ?Medications -  No data to display ? ?Initial Impression / Assessment and Plan / UC Course  ?I have reviewed the triage vital signs and the nursing notes. ? ?Pertinent labs & imaging results that were available during my care of the patient were reviewed by me and considered in my medical decision making (see chart for details). ? ?  ? ?1.  Allergic reaction: ?Famotidine 20 mg orally daily for 3 days ?Prednisone 40 mg orally daily for 3 days ?Hydroxyzine 25 mg every 6 hours for 3 days ?Return precautions given ?Avoidance of seafood emphasized. ?Final Clinical Impressions(s) / UC Diagnoses  ? ?Final diagnoses:  ?Allergic reaction to food, initial encounter  ? ? ? ?Discharge Instructions   ? ?  ?Please take medications as prescribed ?I expect the swelling to improve in the next 24 hours ?If you have worsening swelling of your lips, tongue or shortness of breath please return to the urgent care to be reevaluated or go to the emergency department. ? ? ?ED Prescriptions   ? ? Medication Sig Dispense Auth. Provider  ? predniSONE (DELTASONE) 20 MG tablet Take 2 tablets (40 mg total) by mouth daily for 3 days. 6 tablet Ezri Landers, Britta Mccreedy, MD  ? hydrOXYzine (ATARAX) 25 MG tablet Take 1 tablet (25 mg total) by mouth every 6 (six) hours. 12 tablet Erlin Gardella, Britta Mccreedy, MD  ? famotidine (PEPCID) 20 MG tablet Take 1 tablet (20 mg total) by mouth 2 (two) times daily for 3 days. 6 tablet Hondo Nanda, Britta Mccreedy, MD  ? ?  ? ?PDMP not reviewed this encounter. ?  ?Merrilee Jansky, MD ?02/18/22 1456 ? ?

## 2022-02-18 NOTE — Discharge Instructions (Addendum)
Please take medications as prescribed ?I expect the swelling to improve in the next 24 hours ?If you have worsening swelling of your lips, tongue or shortness of breath please return to the urgent care to be reevaluated or go to the emergency department. ?

## 2022-06-08 ENCOUNTER — Ambulatory Visit (INDEPENDENT_AMBULATORY_CARE_PROVIDER_SITE_OTHER): Payer: Medicare Other | Admitting: Pulmonary Disease

## 2022-06-08 ENCOUNTER — Ambulatory Visit (INDEPENDENT_AMBULATORY_CARE_PROVIDER_SITE_OTHER): Payer: Medicare Other

## 2022-06-08 ENCOUNTER — Encounter: Payer: Self-pay | Admitting: Pulmonary Disease

## 2022-06-08 VITALS — BP 130/80 | HR 68 | Ht 62.0 in | Wt 239.6 lb

## 2022-06-08 DIAGNOSIS — J4540 Moderate persistent asthma, uncomplicated: Secondary | ICD-10-CM

## 2022-06-08 DIAGNOSIS — R0683 Snoring: Secondary | ICD-10-CM | POA: Diagnosis not present

## 2022-06-08 LAB — CBC WITH DIFFERENTIAL/PLATELET
Basophils Absolute: 0 10*3/uL (ref 0.0–0.1)
Basophils Relative: 0.3 % (ref 0.0–3.0)
Eosinophils Absolute: 0.1 10*3/uL (ref 0.0–0.7)
Eosinophils Relative: 1 % (ref 0.0–5.0)
HCT: 38.3 % (ref 36.0–46.0)
Hemoglobin: 12.7 g/dL (ref 12.0–15.0)
Lymphocytes Relative: 32.4 % (ref 12.0–46.0)
Lymphs Abs: 2.2 10*3/uL (ref 0.7–4.0)
MCHC: 33.3 g/dL (ref 30.0–36.0)
MCV: 91 fl (ref 78.0–100.0)
Monocytes Absolute: 0.4 10*3/uL (ref 0.1–1.0)
Monocytes Relative: 6.5 % (ref 3.0–12.0)
Neutro Abs: 4 10*3/uL (ref 1.4–7.7)
Neutrophils Relative %: 59.8 % (ref 43.0–77.0)
Platelets: 288 10*3/uL (ref 150.0–400.0)
RBC: 4.2 Mil/uL (ref 3.87–5.11)
RDW: 14.6 % (ref 11.5–15.5)
WBC: 6.7 10*3/uL (ref 4.0–10.5)

## 2022-06-08 LAB — COMPREHENSIVE METABOLIC PANEL
ALT: 12 U/L (ref 0–35)
AST: 24 U/L (ref 0–37)
Albumin: 4.2 g/dL (ref 3.5–5.2)
Alkaline Phosphatase: 78 U/L (ref 39–117)
BUN: 12 mg/dL (ref 6–23)
CO2: 31 mEq/L (ref 19–32)
Calcium: 9.9 mg/dL (ref 8.4–10.5)
Chloride: 102 mEq/L (ref 96–112)
Creatinine, Ser: 0.64 mg/dL (ref 0.40–1.20)
GFR: 89.91 mL/min (ref >60.00)
Glucose, Bld: 94 mg/dL (ref 70–99)
Potassium: 4.6 mEq/L (ref 3.5–5.1)
Sodium: 138 mEq/L (ref 135–145)
Total Bilirubin: 0.4 mg/dL (ref 0.2–1.2)
Total Protein: 9.1 g/dL — ABNORMAL HIGH (ref 6.0–8.3)

## 2022-06-08 MED ORDER — MONTELUKAST SODIUM 10 MG PO TABS: 10.0000 mg | ORAL_TABLET | Freq: Every day | ORAL | 5 refills | Status: DC

## 2022-06-08 MED ORDER — FLUTICASONE-SALMETEROL 250-50 MCG/ACT IN AEPB: 1.0000 | INHALATION_SPRAY | Freq: Two times a day (BID) | RESPIRATORY_TRACT | 11 refills | Status: DC

## 2022-06-08 NOTE — Progress Notes (Signed)
[place] [facility]  Chief Complaint  Patient presents with   Consult    Consult: Snoring, falling asleep during the day.    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  Past Medical History:  GERD, Arthritis, Chronic Hep B with cirrhosis, HLD, HTN  Constitutional:  BP 130/80 (BP Location: Left Arm)   Pulse 68   Ht 5\' 2"  (1.575 m)   Wt 239 lb 9.6 oz (108.7 kg)   SpO2 97%   BMI 43.82 kg/m   Brief Summary:  Colleen [name] is a 71 y.o. female with snoring and asthma.      Subjective:   She is here with her daughter.  She was diagnosed with asthma several years ago.  She gets seasonal allergies with Spring and Fall being worse times of year.  She gets itch eyes, runny nose and scratchy throat when around cats or grasses.  She gets facial/tongue/lip swelling from penicillin, shellfish, and shrimp.  No issues using aspirin.  She gets pruritus after eating tomatoes.  No history of nasal polyps.  She has been using Qvar and albuterol.  She feels she needs something more to help her cough and congestion.    She goes to sleep between 8 pm and 12 am.  She falls asleep quickly.  She is a restless sleeper.  She wakes up several times to use the bathroom.  She gets out of bed between 5 and 7 am.  She feels tired in the morning.  She denies morning headache.  She does not use anything to help her fall sleep or stay awake.  She can fall asleep whenever she is sitting quiet (she fell asleep while talking to a doctor at her last visit).  She denies sleep walking, sleep talking, bruxism, or nightmares.  There is no history of restless legs.  She denies sleep hallucinations, sleep paralysis, or cataplexy.  The Epworth score is 18 out of 24.   Physical Exam:   Appearance - well kempt    ENMT - no sinus tenderness, no oral exudate, no LAN, Mallampati 4 airway, no stridor  Respiratory - equal breath sounds bilaterally, no wheezing or rales  CV - s1s2 regular rate and rhythm, no murmurs  Ext - no clubbing, no edema  Skin - no rashes  Psych - normal mood and affect   Pulmonary testing:    Chest Imaging:    Sleep Tests:    Social History:  She  reports that she has never smoked. She has never used smokeless tobacco. She reports that she does not drink alcohol and does not use drugs.  Family History:  Her family history includes Cancer in her father and mother.    Discussion:  She has seasonal allergies and history of asthma.  She has progressive cough and chest congestion.    She has snoring, sleep disruption, apnea and daytime sleepiness.  She has history of hypertension and her BMI is > 35.  I am concerned she could have obstructive sleep apnea.  Assessment/Plan:   Snoring with excessive daytime sleepiness. - will need to arrange for a home sleep study  Moderate, persistent asthma. - chest xray, CBC with diff, IgE, CMET today - will arrange for pulmonary function test - will change from Qvar to advair 250 one puff bid - add singulair 10 mg  nightly - prn albuterol  Allergic rhinitis. - flonase 1 spray qhs - add singulair  Chronic hepatitis B with cirrhosis. - followed by gastroenterology and [facility]  Obesity. - discussed how weight can impact sleep and risk for sleep disordered breathing - discussed options to assist with weight loss: combination of diet modification, cardiovascular and strength training exercises  Cardiovascular risk. - had an extensive discussion regarding the adverse health consequences related to untreated sleep disordered breathing - specifically discussed the risks for hypertension, coronary artery disease, cardiac dysrhythmias, cerebrovascular disease, and diabetes - lifestyle modification discussed  Safe  driving practices. - discussed how sleep disruption can increase risk of accidents, particularly when driving - safe driving practices were discussed  Therapies for obstructive sleep apnea. - if the sleep study shows significant sleep apnea, then various therapies for treatment were reviewed: CPAP, oral appliance, and surgical interventions   Time Spent Involved in Patient Care on Day of Examination:  50 minutes  Follow up:   Patient Instructions  Chest xray and lab tests today  Will schedule home sleep study  Advair one puff in the morning and one puff in the evening, and rinse your mouth after each use  Stop using Qvar once you start using Advair  Montelukast (singulair) 10 mg pill nightly  Flonase nasal spray one spray in each nostril nightly  Albuterol two puffs every 6 hours as needed for coughing, wheeze, chest congestion, or shortness of breath  Will arrange for follow up in 6 weeks when you do your pulmonary function test  Medication List:   Allergies as of 06/08/2022       Reactions   Shellfish Allergy Shortness Of Breath, Swelling   Shrimp (diagnostic) Anaphylaxis   Penicillins Hives, Swelling, Other (See Comments)   Did it involve swelling of the face/tongue/throat, SOB, or low BP? Yes Did it involve sudden or severe rash/hives, skin peeling, or any reaction on the inside of your mouth or nose? Yes Did you need to seek medical attention at a hospital or doctor's office? Yes When did it last happen? Late 20's If all above answers are "NO", may proceed with cephalosporin use.        Medication List        Accurate as of June 08, 2022 10:23 AM. If you have any questions, ask your nurse or doctor.          STOP taking these medications    beclomethasone 80 MCG/ACT inhaler Commonly known as: QVAR Stopped by: Coralyn Helling, MD       TAKE these medications    albuterol 108 (90 Base) MCG/ACT inhaler Commonly known as: Proventil HFA Inhale 2  puffs into the lungs every 4 (four) hours as needed for wheezing or shortness of breath. What changed: Another medication with the same name was removed. Continue taking this medication, and follow the directions you see here. Changed by: Coralyn Helling, MD   amLODipine 10 MG tablet Commonly known as: NORVASC Take 10 mg by mouth daily.   CENTRUM SILVER PO Take 1 tablet by mouth daily.   entecavir 0.5 MG tablet Commonly known as: BARACLUDE Take 0.5 mg by mouth daily.   famotidine 20 MG tablet Commonly known as: PEPCID Take 1 tablet (20 mg total) by mouth 2 (two) times daily for 3 days.   fluticasone 50 MCG/ACT nasal spray Commonly known as: FLONASE Place 2 sprays into both nostrils daily.   fluticasone-salmeterol 250-50 MCG/ACT Aepb Commonly known as: ADVAIR Inhale 1 puff  into the lungs every 12 (twelve) hours. Started by: Coralyn Helling, MD   gabapentin 300 MG capsule Commonly known as: NEURONTIN Take 1 capsule (300 mg total) by mouth 2 (two) times daily. What changed: when to take this   hydrOXYzine 25 MG tablet Commonly known as: ATARAX Take 1 tablet (25 mg total) by mouth every 6 (six) hours.   meloxicam 7.5 MG tablet Commonly known as: Mobic Take 1 tablet (7.5 mg total) by mouth daily.   montelukast 10 MG tablet Commonly known as: SINGULAIR Take 1 tablet (10 mg total) by mouth at bedtime. Started by: Coralyn Helling, MD   omeprazole 20 MG capsule Commonly known as: PRILOSEC Take 20 mg by mouth daily.   vitamin E 45 MG (100 UNITS) capsule Take 100 Units by mouth daily.        Signature:  Coralyn Helling, MD [facility] Pulmonary/Critical Care Pager - [phone] 06/08/2022, 10:23 AM

## 2022-06-08 NOTE — Patient Instructions (Signed)
Chest xray and lab tests today  Will schedule home sleep study  Advair one puff in the morning and one puff in the evening, and rinse your mouth after each use  Stop using Qvar once you start using Advair  Montelukast (singulair) 10 mg pill nightly  Flonase nasal spray one spray in each nostril nightly  Albuterol two puffs every 6 hours as needed for coughing, wheeze, chest congestion, or shortness of breath  Will arrange for follow up in 6 weeks when you do your pulmonary function test

## 2022-06-09 LAB — IGE: IgE (Immunoglobulin E), Serum: 207 kU/L — ABNORMAL HIGH (ref <=114)

## 2022-07-20 ENCOUNTER — Ambulatory Visit (INDEPENDENT_AMBULATORY_CARE_PROVIDER_SITE_OTHER): Payer: Medicare Other | Admitting: Pulmonary Disease

## 2022-07-20 DIAGNOSIS — J4540 Moderate persistent asthma, uncomplicated: Secondary | ICD-10-CM | POA: Diagnosis not present

## 2022-07-20 LAB — PULMONARY FUNCTION TEST
DL/VA % pred: 146 %
DL/VA: 6.16 ml/min/mmHg/L
DLCO cor % pred: 112 %
DLCO cor: 20.51 ml/min/mmHg
DLCO unc % pred: 109 %
DLCO unc: 20.05 ml/min/mmHg
FEF 25-75 Post: 1.3 L/sec
FEF 25-75 Pre: 1.48 L/sec
FEF2575-%Change-Post: -12 %
FEF2575-%Pred-Post: 70 %
FEF2575-%Pred-Pre: 80 %
FEV1-%Change-Post: -4 %
FEV1-%Pred-Post: 55 %
FEV1-%Pred-Pre: 58 %
FEV1-Post: 1.18 L
FEV1-Pre: 1.24 L
FEV1FVC-%Change-Post: 2 %
FEV1FVC-%Pred-Pre: 113 %
FEV6-%Change-Post: -7 %
FEV6-%Pred-Post: 49 %
FEV6-%Pred-Pre: 53 %
FEV6-Post: 1.33 L
FEV6-Pre: 1.43 L
FEV6FVC-%Pred-Post: 104 %
FEV6FVC-%Pred-Pre: 104 %
FVC-%Change-Post: -7 %
FVC-%Pred-Post: 47 %
FVC-%Pred-Pre: 51 %
FVC-Post: 1.33 L
FVC-Pre: 1.43 L
Post FEV1/FVC ratio: 89 %
Post FEV6/FVC ratio: 100 %
Pre FEV1/FVC ratio: 87 %
Pre FEV6/FVC Ratio: 100 %
RV % pred: 100 %
RV: 2.08 L
TLC % pred: 80 %
TLC: 3.82 L

## 2022-07-20 NOTE — Patient Instructions (Signed)
Full PFT Performed Today  

## 2022-07-20 NOTE — Progress Notes (Signed)
Full PFT Performed Today  

## 2022-08-02 ENCOUNTER — Ambulatory Visit (INDEPENDENT_AMBULATORY_CARE_PROVIDER_SITE_OTHER): Payer: Medicare Other | Admitting: Pulmonary Disease

## 2022-08-02 ENCOUNTER — Encounter: Payer: Self-pay | Admitting: Pulmonary Disease

## 2022-08-02 VITALS — BP 122/78 | HR 82 | Temp 97.8°F | Ht 63.0 in | Wt 238.4 lb

## 2022-08-02 DIAGNOSIS — R0683 Snoring: Secondary | ICD-10-CM | POA: Diagnosis not present

## 2022-08-02 DIAGNOSIS — H1013 Acute atopic conjunctivitis, bilateral: Secondary | ICD-10-CM | POA: Diagnosis not present

## 2022-08-02 DIAGNOSIS — J4489 Other specified chronic obstructive pulmonary disease: Secondary | ICD-10-CM | POA: Diagnosis not present

## 2022-08-02 MED ORDER — OLOPATADINE HCL 0.2 % OP SOLN: 1.0000 [drp] | Freq: Every day | OPHTHALMIC | 0 refills | Status: DC | PRN

## 2022-08-02 NOTE — Addendum Note (Signed)
Addended by: Monna Fam L on: 08/02/2022 01:43 PM   Modules accepted: Orders

## 2022-08-02 NOTE — Progress Notes (Signed)
Colleen [name] Colleen [name] [name], Colleen [name] Colleen [name] [name], Colleen [name] Colleen [name] [name]  Chief Complaint  Patient presents with   Follow-up    Discuss COPD. Snoring qhs.  Improved fatigue.  Breathing ok per pt.  Would like flu vaccine.    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  Past Medical History:  GERD, Arthritis, Chronic Hep B with cirrhosis, HLD, HTN  Constitutional:  BP 122/78 (BP Location: Right Arm, Patient Position: Sitting, Cuff Size: Large)   Pulse 82   Temp 97.8 F (36.6 C) (Oral)   Ht 5\' 3"  (1.6 m)   Wt 238 lb 6.4 oz (108.1 kg)   SpO2 97%   BMI 42.23 kg/m   Brief Summary:  Colleen [name] Colleen [name] [name] is a 71 y.o. female with snoring Colleen [name] asthma.      Subjective:   She is here with her daughter.  Chest xray from 06/08/22 was normal.  IgE 207, Eos 0.1.    PFT showed mixed obstruction Colleen [name] restriction.  She has sinus congestion Colleen [name] post nasal drip.  She gets itchy eyes Colleen [name] water eyes when her allergies flare up.  Not having cough, wheeze, or sputum.  Still has trouble with sleep Colleen [name] snoring.    She plans to get her flu shot after she returns from a trip next month.   Physical Exam:   Appearance - well kempt   ENMT - no sinus tenderness, no oral exudate, no LAN, Mallampati 3 airway, no stridor, deviated septum  Respiratory - equal breath sounds bilaterally, no wheezing or rales  CV - s1s2 regular rate Colleen [name] rhythm, no murmurs  Ext - no clubbing, no edema  Skin - no rashes  Psych - normal mood Colleen [name] affect    Colleen [name] [name] testing:  IgE 06/08/22 >> 207 PFT 07/20/22 >> FEV1 1.24 (58%), FEV1% 87, TLC 3.82 (80%), DLCO 109%  Sleep Tests:    Social History:  She  reports that she has never smoked. She has never used smokeless tobacco. She reports that she does not drink alcohol Colleen [name] does not use drugs.  Family History:  Her  family history includes Cancer in her father Colleen [name] mother.     Assessment/Plan:   Snoring with excessive daytime sleepiness. - will check on status of home sleep study  COPD with asthma. - continue advair 250 one puff bid, singulair 10 mg qhs - prn albuterol  Allergic rhinitis with deviated nasal septum. - continue flonase, singulair - advised her to try nasal irrigation daily  Allergic conjunctivitis. - she can try OTC olopatadine prn  Chronic hepatitis B with cirrhosis. - followed by gastroenterology Colleen [name] [facility]   Time Spent Involved in Patient Colleen [name] [name] on Day of Examination:  37 minutes  Follow up:   Patient Instructions  Will check on status of home sleep study Colleen [name] call you with results  You can try olopatadine eye drops as needed for allergy symptoms in your eyes  Try using saline sinus rinse to help with nasal congestion  Follow up in 6 months  Medication List:   Allergies as of 08/02/2022       Reactions   Shellfish Allergy Shortness Of Breath, Swelling   Shrimp (diagnostic) Anaphylaxis   Penicillins Hives, Swelling, Other (See Comments)   Did it involve swelling of the face/tongue/throat, SOB, or low BP? Yes Did it involve sudden or severe rash/hives, skin peeling, or any reaction  on the inside of your mouth or nose? Yes Did you need to seek medical attention at a hospital or doctor's office? Yes When did it last happen? Late 20's If all above answers are "NO", may proceed with cephalosporin use.        Medication List        Accurate as of August 02, 2022 11:44 AM. If you have any questions, ask your nurse or doctor.          albuterol 108 (90 Base) MCG/ACT inhaler Commonly known as: Proventil HFA Inhale 2 puffs into the lungs every 4 (four) hours as needed for wheezing or shortness of breath.   amLODipine 10 MG tablet Commonly known as: NORVASC Take 10 mg by mouth daily.   CENTRUM SILVER PO Take 1 tablet by mouth daily.   entecavir  0.5 MG tablet Commonly known as: BARACLUDE Take 0.5 mg by mouth daily.   famotidine 20 MG tablet Commonly known as: PEPCID Take 1 tablet (20 mg total) by mouth 2 (two) times daily for 3 days.   fluticasone 50 MCG/ACT nasal spray Commonly known as: FLONASE Place 2 sprays into both nostrils daily.   fluticasone-salmeterol 250-50 MCG/ACT Aepb Commonly known as: ADVAIR Inhale 1 puff into the lungs every 12 (twelve) hours.   gabapentin 300 MG capsule Commonly known as: NEURONTIN Take 1 capsule (300 mg total) by mouth 2 (two) times daily. What changed: when to take this   hydrOXYzine 25 MG tablet Commonly known as: ATARAX Take 1 tablet (25 mg total) by mouth every 6 (six) hours.   meloxicam 7.5 MG tablet Commonly known as: Mobic Take 1 tablet (7.5 mg total) by mouth daily.   montelukast 10 MG tablet Commonly known as: SINGULAIR Take 1 tablet (10 mg total) by mouth at bedtime.   Olopatadine HCl 0.2 % Soln Apply 1 drop to eye daily as needed (Eye allergies). Started by: Coralyn Helling, MD   omeprazole 20 MG capsule Commonly known as: PRILOSEC Take 20 mg by mouth daily.   vitamin E 45 MG (100 UNITS) capsule Take 100 Units by mouth daily.        Signature:  Coralyn Helling, MD [facility] Colleen [name] [name]/Colleen [name] Colleen [name] [name] Pager - [phone] 08/02/2022, 11:44 AM

## 2022-08-02 NOTE — Patient Instructions (Addendum)
Will check on status of home sleep study and call you with results  You can try olopatadine eye drops as needed for allergy symptoms in your eyes  Try using saline sinus rinse to help with nasal congestion  Follow up in 6 months

## 2022-08-09 ENCOUNTER — Encounter (HOSPITAL_BASED_OUTPATIENT_CLINIC_OR_DEPARTMENT_OTHER): Payer: Self-pay

## 2022-08-09 ENCOUNTER — Other Ambulatory Visit: Payer: Self-pay

## 2022-08-09 ENCOUNTER — Ambulatory Visit: Payer: Medicare Other

## 2022-08-09 ENCOUNTER — Emergency Department (HOSPITAL_BASED_OUTPATIENT_CLINIC_OR_DEPARTMENT_OTHER): Payer: Medicare Other | Admitting: Radiology

## 2022-08-09 ENCOUNTER — Emergency Department (HOSPITAL_BASED_OUTPATIENT_CLINIC_OR_DEPARTMENT_OTHER)
Admission: EM | Admit: 2022-08-09 | Discharge: 2022-08-09 | Disposition: A | Discharge status: Home / Self Care | Payer: Medicare Other | Attending: Emergency Medicine | Admitting: Emergency Medicine

## 2022-08-09 DIAGNOSIS — M25552 Pain in left hip: Secondary | ICD-10-CM | POA: Diagnosis not present

## 2022-08-09 DIAGNOSIS — G4733 Obstructive sleep apnea (adult) (pediatric): Secondary | ICD-10-CM | POA: Diagnosis not present

## 2022-08-09 DIAGNOSIS — Z79899 Other long term (current) drug therapy: Secondary | ICD-10-CM | POA: Diagnosis not present

## 2022-08-09 DIAGNOSIS — Z96652 Presence of left artificial knee joint: Secondary | ICD-10-CM | POA: Diagnosis not present

## 2022-08-09 DIAGNOSIS — M1711 Unilateral primary osteoarthritis, right knee: Secondary | ICD-10-CM | POA: Insufficient documentation

## 2022-08-09 DIAGNOSIS — R0683 Snoring: Secondary | ICD-10-CM

## 2022-08-09 MED ORDER — CELECOXIB 100 MG PO CAPS: 100.0000 mg | ORAL_CAPSULE | Freq: Two times a day (BID) | ORAL | 0 refills | Status: DC

## 2022-08-09 NOTE — ED Triage Notes (Signed)
Patient presents with left hip pain onset 3 months ago. Has tired heating pads, ice, arthritis pain medication. Denies fall or injury.

## 2022-08-09 NOTE — ED Provider Notes (Signed)
[facility] EMERGENCY DEPT Provider Note   CSN: [id number] Arrival date & time: 08/09/22  1117     History  Chief Complaint  Patient presents with   Hip Pain    Colleen [name] is a 71 y.o. female.  Patient presents to the emergency department for evaluation of left hip pain.  Patient reports that the pain has been patient presents to the emergency department for evaluation of left hip pain.  Patient reports that the pain has been ongoing for 3 months.  Patient has been using over-the-counter medications and heating pads without help.  She reports that she had her left knee replaced 3 years ago.  She was supposed to have her right knee replaced as well.  Patient denies any injury.  No numbness, tingling or weakness of lower extremities.  No change in bowel or bladder function.       Home Medications Prior to Admission medications   Medication Sig Start Date End Date Taking? Authorizing Provider  albuterol (PROVENTIL HFA) 108 (90 Base) MCG/ACT inhaler Inhale 2 puffs into the lungs every 4 (four) hours as needed for wheezing or shortness of breath. 03/24/21  Yes Covington, Maralyn Sago M, PA-C  amLODipine (NORVASC) 10 MG tablet Take 10 mg by mouth daily.   Yes [provider]  celecoxib (CELEBREX) 100 MG capsule Take 1 capsule (100 mg total) by mouth 2 (two) times daily. 08/09/22  Yes Deanda Ruddell, Canary Brim, MD  entecavir (BARACLUDE) 0.5 MG tablet Take 0.5 mg by mouth daily.   Yes [provider]  fluticasone (FLONASE) 50 MCG/ACT nasal spray Place 2 sprays into both nostrils daily. 03/24/21  Yes Covington, Maralyn Sago M, PA-C  fluticasone-salmeterol (ADVAIR) 250-50 MCG/ACT AEPB Inhale 1 puff into the lungs every 12 (twelve) hours. 06/08/22  Yes Coralyn Helling, MD  gabapentin (NEURONTIN) 300 MG capsule Take 1 capsule (300 mg total) by mouth 2 (two) times daily. Patient taking differently: Take 300 mg by mouth at bedtime. 01/11/16  Yes Patel, Donika K, DO  hydrOXYzine  (ATARAX) 25 MG tablet Take 1 tablet (25 mg total) by mouth every 6 (six) hours. 02/18/22  Yes Lamptey, Britta Mccreedy, MD  montelukast (SINGULAIR) 10 MG tablet Take 1 tablet (10 mg total) by mouth at bedtime. 06/08/22  Yes Coralyn Helling, MD  Multiple Vitamins-Minerals (CENTRUM SILVER PO) Take 1 tablet by mouth daily.   Yes [provider]  Olopatadine HCl 0.2 % SOLN Apply 1 drop to eye daily as needed (Eye allergies). 08/02/22  Yes Coralyn Helling, MD  omeprazole (PRILOSEC) 20 MG capsule Take 20 mg by mouth daily.  06/04/15  Yes [provider]  vitamin E 100 UNIT capsule Take 100 Units by mouth daily.   Yes [provider]  famotidine (PEPCID) 20 MG tablet Take 1 tablet (20 mg total) by mouth 2 (two) times daily for 3 days. 02/18/22 02/21/22  LampteyBritta Mccreedy, MD      Allergies    Shellfish allergy, Shrimp (diagnostic), and Penicillins    Review of Systems   Review of Systems  Physical Exam Updated Vital Signs BP (!) 146/79 (BP Location: Right Arm)   Pulse 74   Temp 98.6 F (37 C) (Oral)   Resp 16   Ht 4\' 11"  (1.499 m)   Wt 107 kg   SpO2 98%   BMI 47.67 kg/m  Physical Exam Vitals and nursing note reviewed.  Constitutional:      General: She is not in acute distress.    Appearance: She  is well-developed.  HENT:     Head: Normocephalic and atraumatic.     Mouth/Throat:     Mouth: Mucous membranes are moist.  Eyes:     General: Vision grossly intact. Gaze aligned appropriately.     Extraocular Movements: Extraocular movements intact.     Conjunctiva/sclera: Conjunctivae normal.  Cardiovascular:     Rate and Rhythm: Normal rate and regular rhythm.     Pulses: Normal pulses.     Heart sounds: Normal heart sounds, S1 normal and S2 normal. No murmur heard.    No friction rub. No gallop.  Pulmonary:     Effort: Pulmonary effort is normal. No respiratory distress.     Breath sounds: Normal breath sounds.  Abdominal:     General: Bowel sounds are normal.      Palpations: Abdomen is soft.     Tenderness: There is no abdominal tenderness. There is no guarding or rebound.     Hernia: No hernia is present.  Musculoskeletal:        General: No swelling.     Cervical back: Full passive range of motion without pain, normal range of motion and neck supple. No spinous process tenderness or muscular tenderness. Normal range of motion.     Lumbar back: Normal.     Left hip: No deformity. Decreased range of motion. Normal strength.     Right knee: No swelling, deformity, effusion, erythema or ecchymosis. Tenderness present.     Right lower leg: No edema.     Left lower leg: No edema.  Skin:    General: Skin is warm and dry.     Capillary Refill: Capillary refill takes less than 2 seconds.     Findings: No ecchymosis, erythema, rash or wound.  Neurological:     General: No focal deficit present.     Mental Status: She is alert and oriented to person, place, and time.     GCS: GCS eye subscore is 4. GCS verbal subscore is 5. GCS motor subscore is 6.     Cranial Nerves: Cranial nerves 2-12 are intact.     Sensory: Sensation is intact.     Motor: Motor function is intact.     Coordination: Coordination is intact.  Psychiatric:        Attention and Perception: Attention normal.        Mood and Affect: Mood normal.        Speech: Speech normal.        Behavior: Behavior normal.     ED Results / Procedures / Treatments   Labs (all labs ordered are listed, but only abnormal results are displayed) Labs Reviewed - No data to display  EKG None  Radiology DG Knee Complete 4 Views Right  Result Date: 08/09/2022 CLINICAL DATA:  Right knee pain.  No injury EXAM: RIGHT KNEE - COMPLETE 4+ VIEW COMPARISON:  None Available. FINDINGS: Normal alignment no fracture.  Small joint effusion Advanced degenerative change in the patellofemoral joint. Moderate degenerative change medially with joint space narrowing and spurring. Mild widening of the lateral joint  space. Mild chondrocalcinosis IMPRESSION: Negative for fracture Tricompartmental degenerative change most severe in the patellofemoral joint and medial joint space. Mild chondrocalcinosis. Electronically Signed   By: Franchot Gallo M.D.   On: 08/09/2022 12:05   DG Hip Unilat W or Wo Pelvis 2-3 Views Left  Result Date: 08/09/2022 CLINICAL DATA:  Chronic left hip pain.  No known injury. EXAM: DG HIP (WITH OR WITHOUT PELVIS) 2-3V  LEFT COMPARISON:  None Available. FINDINGS: Moderate right and mild-to-moderate left femoroacetabular joint space narrowing. Mild-to-moderate bilateral superolateral acetabular degenerative osteophytes. Mild-to-moderate bilateral sacroiliac joint space narrowing and subchondral sclerosis. Mild likely degenerative subchondral lucency within the left pubic body at the pubic symphysis. Moderate right L4-5 disc space narrowing. No acute fracture or dislocation. Vascular phleboliths overlie the left hemipelvis. IMPRESSION: Mild-to-moderate right and mild-to-moderate left femoroacetabular osteoarthritis. Electronically Signed   By: Neita Garnet M.D.   On: 08/09/2022 12:02    Procedures Procedures    Medications Ordered in ED Medications - No data to display  ED Course/ Medical Decision Making/ A&P                           Medical Decision Making Amount and/or Complexity of Data Reviewed Radiology: ordered.   Presents to the emergency department for evaluation of left hip pain.  Patient reports ongoing issues for 3 months.  Sciatica, lumbar radiculopathy, primary hip arthritis, soft tissue inflammation secondary to antalgic gait all considered in the differential diagnosis.  Examination reveals painful range of motion of the left hip.  He does not have any red flags to consider neurosurgical emergency, no evidence of lumbar radiculopathy, normal strength, sensation.  No back or flank pain to suggest intra-abdominal or retroperitoneal causes.  X-ray of right knee shows  severe osteoarthritis.  She does report that the right knee has been hurting her more than usual for some time as well.  Suspect that the pain may be secondary to an antalgic gait coming from her right knee osteoarthritis.  No emergent condition identified today.  Treat with analgesia, follow-up with her orthopedic surgeon.        Final Clinical Impression(s) / ED Diagnoses Final diagnoses:  Left hip pain  Osteoarthritis of right knee, unspecified osteoarthritis type    Rx / DC Orders ED Discharge Orders          Ordered    celecoxib (CELEBREX) 100 MG capsule  2 times daily        08/09/22 1229              Gilda Crease, MD 08/09/22 1230

## 2022-08-17 ENCOUNTER — Telehealth: Payer: Self-pay | Admitting: Pulmonary Disease

## 2022-08-17 DIAGNOSIS — G4733 Obstructive sleep apnea (adult) (pediatric): Secondary | ICD-10-CM | POA: Diagnosis not present

## 2022-08-17 NOTE — Telephone Encounter (Signed)
HST 08/09/22 >> 15.7, SpO2 lwo 74%   Please inform her that her sleep study shows moderate obstructive sleep apnea.  Please arrange for ROV with me or NP to discuss treatment options.

## 2022-08-17 NOTE — Telephone Encounter (Signed)
ATC LMTCB  

## 2022-08-25 ENCOUNTER — Other Ambulatory Visit: Payer: Self-pay | Admitting: Family Medicine

## 2022-08-25 DIAGNOSIS — E2839 Other primary ovarian failure: Secondary | ICD-10-CM

## 2022-08-26 ENCOUNTER — Encounter: Payer: Self-pay | Admitting: Pulmonary Disease

## 2022-08-26 NOTE — Telephone Encounter (Signed)
ATC patient. LMTCB Will mail a letter asking patient to call office for results.

## 2022-09-07 NOTE — Telephone Encounter (Signed)
Spoke with patient  regarding HST results. They verbalized understanding. No further questions.  Nothing further needed at this time.   

## 2022-09-15 ENCOUNTER — Ambulatory Visit (INDEPENDENT_AMBULATORY_CARE_PROVIDER_SITE_OTHER): Payer: Medicare Other | Admitting: Nurse Practitioner

## 2022-09-15 ENCOUNTER — Encounter: Payer: Self-pay | Admitting: Nurse Practitioner

## 2022-09-15 VITALS — BP 134/82 | HR 77 | Temp 98.1°F | Ht 63.0 in | Wt 232.4 lb

## 2022-09-15 DIAGNOSIS — J4540 Moderate persistent asthma, uncomplicated: Secondary | ICD-10-CM

## 2022-09-15 DIAGNOSIS — R0683 Snoring: Secondary | ICD-10-CM | POA: Diagnosis not present

## 2022-09-15 DIAGNOSIS — G4733 Obstructive sleep apnea (adult) (pediatric): Secondary | ICD-10-CM | POA: Diagnosis not present

## 2022-09-15 DIAGNOSIS — Z6841 Body Mass Index (BMI) 40.0 and over, adult: Secondary | ICD-10-CM

## 2022-09-15 DIAGNOSIS — J4541 Moderate persistent asthma with (acute) exacerbation: Secondary | ICD-10-CM | POA: Insufficient documentation

## 2022-09-15 NOTE — Assessment & Plan Note (Signed)
Well-controlled on current ICS/LABA regimen and singulair. Asthma action plan in place.  Patient Instructions  Your sleep study revealed moderate obstructive sleep apnea.  We discussed how untreated sleep apnea puts an individual at risk for cardiac arrhthymias, pulm HTN, DM, stroke and increases their risk for daytime accidents. We also briefly reviewed treatment options including weight loss, side sleeping position, oral appliance, CPAP therapy or referral to ENT for possible surgical options, which would not be an option with your current BMI  Start CPAP 5-15 cmH2O with mask of choice and heated humidification  Use every night, minimum of 4-6 hours a night but goal is to wear the entire night  Change equipment every 30 days or as directed by DME. Wash your tubing with warm soap and water daily, hang to dry. Wash humidifier portion weekly.  Be aware of reduced alertness and do not drive or operate heavy machinery if experiencing this or drowsiness.  Healthy weight management discussed.  Avoid or decrease alcohol consumption and medications that make you more sleepy, if possible. Notify if persistent daytime sleepiness occurs even with consistent use of CPAP.  Continue Albuterol inhaler 2 puffs every 6 hours as needed for shortness of breath or wheezing. Notify if symptoms persist despite rescue inhaler/neb use. Continue flonase nasal spray 2 sprays each nostril daily Continue advair 1 puff Twice daily. Brush tongue and rinse mouth afterwards Continue montelukast (singulair) 1 tab At bedtime   Follow up in 12 weeks with Dr. Craige Cotta or Katie Lorieann Argueta,NP. If symptoms do not improve or worsen, please contact office for sooner follow up or seek emergency care.

## 2022-09-15 NOTE — Assessment & Plan Note (Signed)
BMI 41. Healthy weight loss encouraged.  

## 2022-09-15 NOTE — Progress Notes (Signed)
 @Patient  [name]    DOB: March 15, 1951, 71 y.o.   MRN: [id number]  No chief complaint on file.   Referring provider: No ref. provider found  HPI: 71 year old female, never smoker followed for asthma, allergic rhinitis and mild OSA.  She is a patient of Dr. Evlyn CourierSood's and last seen in office 08/02/2022.  Past medical history significant for GERD, arthritis, chronic hepatitis B with cirrhosis, HLD, hypertension.  TEST/EVENTS:  07/20/2022 PFT: FVC 51, FEV1 58, ratio 89, TLC 80, DLCOcor 112 08/17/2022 HST: AHI 15.7/h, SpO2 low 74%, average 94%  08/02/2022: OV with Dr. Craige CottaSood. CXR from August nl. IgE 207, eos 0.1. PFT mixed obstruction and restriction. Sinus congestion and postnasal drip. Gets itchy/watery eyes when allergies flare. Still having trouble with sleep and loud snoring. HST previously ordered - will f/u on this. Advised to trial nasal irrigations daily and OTC olopatadine eye drops. Continued advair, singulair, flonase  09/15/2022: Today - follow up Patient presents today for follow-up with her daughter to discuss home sleep study results.  She had HST on 11/8 which revealed moderate obstructive sleep apnea with AHI of 15.7.  She continues to have trouble with her sleep and snores mildly at night.  She also feels like sometimes when she is driving she forgets where she is going.  She feels very tired throughout the day.  Denies any sleep parasomnia/paralysis, drowsy driving.  No history of narcolepsy or cataplexy.  Her breathing is overall stable.  She continues on Advair and Singulair.  She also uses Flonase and occasionally uses nasal irrigations for her allergies, which seem to be well-controlled right now.  Allergies  Allergen Reactions   Shellfish Allergy Shortness Of Breath and Swelling   Shrimp (Diagnostic) Anaphylaxis   Penicillins Hives, Swelling and Other (See Comments)    Did it involve swelling of the face/tongue/throat, SOB, or low BP? Yes Did it involve  sudden or severe rash/hives, skin peeling, or any reaction on the inside of your mouth or nose? Yes Did you need to seek medical attention at a hospital or doctor's office? Yes When did it last happen? Late 20's If all above answers are "NO", may proceed with cephalosporin use.     Immunization History  Administered Date(s) Administered   Hepatitis A, Ped/Adol-2 Dose 07/28/2011   Influenza Split 07/30/2013   Influenza, High Dose Seasonal PF 08/28/2018   Influenza, Quadrivalent, Recombinant, Inj, Pf 07/25/2017   Influenza,inj,Quad PF,6+ Mos 07/15/2016   PFIZER(Purple Top)SARS-COV-2 Vaccination 12/06/2019, 12/31/2019, 08/22/2020   Pneumococcal Conjugate-13 10/25/2017   Tdap 03/12/2015    Past Medical History:  Diagnosis Date   Acid reflux    Arthritis    Asthma    Chronic hepatitis B (HCC)    COPD (chronic obstructive pulmonary disease) (HCC)    Mild   GERD (gastroesophageal reflux disease)    History of iron deficiency anemia    with pregnancy   Hypercholesterolemia    Hypertension    Numbness and tingling of both upper extremities     Tobacco History: Social History   Tobacco Use  Smoking Status Never  Smokeless Tobacco Never   Counseling given: Not Answered   Outpatient Medications Prior to Visit  Medication Sig Dispense Refill   albuterol (PROVENTIL HFA) 108 (90 Base) MCG/ACT inhaler Inhale 2 puffs into the lungs every 4 (four) hours as needed for wheezing or shortness of breath. 1 each 0   amLODipine (NORVASC) 10 MG tablet Take 10 mg by mouth daily.  celecoxib (CELEBREX) 100 MG capsule Take 1 capsule (100 mg total) by mouth 2 (two) times daily. 30 capsule 0   entecavir (BARACLUDE) 0.5 MG tablet Take 0.5 mg by mouth daily.     fluticasone (FLONASE) 50 MCG/ACT nasal spray Place 2 sprays into both nostrils daily. 16 mL 0   fluticasone-salmeterol (ADVAIR) 250-50 MCG/ACT AEPB Inhale 1 puff into the lungs every 12 (twelve) hours. 60 each 11   gabapentin  (NEURONTIN) 300 MG capsule Take 1 capsule (300 mg total) by mouth 2 (two) times daily. (Patient taking differently: Take 300 mg by mouth at bedtime.) 60 capsule 5   hydrOXYzine (ATARAX) 25 MG tablet Take 1 tablet (25 mg total) by mouth every 6 (six) hours. 12 tablet 0   montelukast (SINGULAIR) 10 MG tablet Take 1 tablet (10 mg total) by mouth at bedtime. 30 tablet 5   Multiple Vitamins-Minerals (CENTRUM SILVER PO) Take 1 tablet by mouth daily.     Olopatadine HCl 0.2 % SOLN Apply 1 drop to eye daily as needed (Eye allergies).  0   omeprazole (PRILOSEC) 20 MG capsule Take 20 mg by mouth daily.      vitamin E 100 UNIT capsule Take 100 Units by mouth daily.     famotidine (PEPCID) 20 MG tablet Take 1 tablet (20 mg total) by mouth 2 (two) times daily for 3 days. 6 tablet 0   No facility-administered medications prior to visit.     Review of Systems:   Constitutional: No weight loss or gain, night sweats, fevers, chills, or lassitude. +excessive daytime fatigue  HEENT: No headaches, difficulty swallowing, tooth/dental problems, or sore throat. No sneezing, itching, ear ache. +nasal congestion, post nasal drip (stable) CV:  No chest pain, orthopnea, PND, swelling in lower extremities, anasarca, dizziness, palpitations, syncope Resp: +shortness of breath with exertion (baseline); snoring. No excess mucus or change in color of mucus. No productive or non-productive. No hemoptysis. No wheezing.  No chest wall deformity GI:  No heartburn, indigestion, abdominal pain GU: No dysuria, change in color of urine, urgency or frequency.  Skin: No rash, lesions, ulcerations MSK:  No joint pain or swelling.   Neuro: No dizziness or lightheadedness.  Psych: No depression or anxiety. Mood stable. +sleep disturbance    Physical Exam:  BP 134/82 (BP Location: Right Arm, Patient Position: Sitting, Cuff Size: Large)   Pulse 77   Temp 98.1 F (36.7 C) (Oral)   Ht 5\' 3"  (1.6 m)   Wt 232 lb 6.4 oz (105.4 kg)    SpO2 99%   BMI 41.17 kg/m   GEN: Pleasant, interactive, well-appearing; obese; in no acute distress. HEENT:  Normocephalic and atraumatic. PERRLA. Sclera white. Nasal turbinates pink, moist and patent bilaterally. No rhinorrhea present. Oropharynx pink and moist, without exudate or edema. No lesions, ulcerations, or postnasal drip. Mallampati III NECK:  Supple w/ fair ROM. No JVD present. Normal carotid impulses w/o bruits. Thyroid symmetrical with no goiter or nodules palpated. No lymphadenopathy.   CV: RRR, no m/r/g, no peripheral edema. Pulses intact, +2 bilaterally. No cyanosis, pallor or clubbing. PULMONARY:  Unlabored, regular breathing. Clear bilaterally A&P w/o wheezes/rales/rhonchi. No accessory muscle use.  GI: BS present and normoactive. Soft, non-tender to palpation. No organomegaly or masses detected.  MSK: No erythema, warmth or tenderness. Cap refil <2 sec all extrem. No deformities or joint swelling noted.  Neuro: A/Ox3. No focal deficits noted.   Skin: Warm, no lesions or rashe Psych: Normal affect and behavior. Judgement and thought content  appropriate.     Lab Results:  CBC    Component Value Date/Time   WBC 6.7 06/08/2022 1030   RBC 4.20 06/08/2022 1030   HGB 12.7 06/08/2022 1030   HCT 38.3 06/08/2022 1030   PLT 288.0 06/08/2022 1030   MCV 91.0 06/08/2022 1030   MCH 29.4 09/03/2020 0855   MCHC 33.3 06/08/2022 1030   RDW 14.6 06/08/2022 1030   LYMPHSABS 2.2 06/08/2022 1030   MONOABS 0.4 06/08/2022 1030   EOSABS 0.1 06/08/2022 1030   BASOSABS 0.0 06/08/2022 1030    BMET    Component Value Date/Time   NA 138 06/08/2022 1030   K 4.6 06/08/2022 1030   CL 102 06/08/2022 1030   CO2 31 06/08/2022 1030   GLUCOSE 94 06/08/2022 1030   BUN 12 06/08/2022 1030   CREATININE 0.64 06/08/2022 1030   CALCIUM 9.9 06/08/2022 1030   GFRNONAA >60 09/03/2020 0855   GFRAA >60 05/25/2019 0218    BNP No results found for: "BNP"   Imaging:  No results  found.       Latest Ref Rng & Units 07/20/2022   10:51 AM  PFT Results  FVC-Pre L 1.43   FVC-Predicted Pre % 51   FVC-Post L 1.33   FVC-Predicted Post % 47   Pre FEV1/FVC % % 87   Post FEV1/FCV % % 89   FEV1-Pre L 1.24   FEV1-Predicted Pre % 58   FEV1-Post L 1.18   DLCO uncorrected ml/min/mmHg 20.05   DLCO UNC% % 109   DLCO corrected ml/min/mmHg 20.51   DLCO COR %Predicted % 112   DLVA Predicted % 146   TLC L 3.82   TLC % Predicted % 80   RV % Predicted % 100     No results found for: "NITRICOXIDE"      Assessment & Plan:   Moderate persistent asthma Well-controlled on current ICS/LABA regimen and singulair. Asthma action plan in place.  Patient Instructions  Your sleep study revealed moderate obstructive sleep apnea.  We discussed how untreated sleep apnea puts an individual at risk for cardiac arrhthymias, pulm HTN, DM, stroke and increases their risk for daytime accidents. We also briefly reviewed treatment options including weight loss, side sleeping position, oral appliance, CPAP therapy or referral to ENT for possible surgical options, which would not be an option with your current BMI  Start CPAP 5-15 cmH2O with mask of choice and heated humidification  Use every night, minimum of 4-6 hours a night but goal is to wear the entire night  Change equipment every 30 days or as directed by DME. Wash your tubing with warm soap and water daily, hang to dry. Wash humidifier portion weekly.  Be aware of reduced alertness and do not drive or operate heavy machinery if experiencing this or drowsiness.  Healthy weight management discussed.  Avoid or decrease alcohol consumption and medications that make you more sleepy, if possible. Notify if persistent daytime sleepiness occurs even with consistent use of CPAP.  Continue Albuterol inhaler 2 puffs every 6 hours as needed for shortness of breath or wheezing. Notify if symptoms persist despite rescue inhaler/neb  use. Continue flonase nasal spray 2 sprays each nostril daily Continue advair 1 puff Twice daily. Brush tongue and rinse mouth afterwards Continue montelukast (singulair) 1 tab At bedtime   Follow up in 12 weeks with Dr. Craige Cotta or Katie Nicoles Sedlacek,NP. If symptoms do not improve or worsen, please contact office for sooner follow up or seek emergency care.  Moderate obstructive sleep apnea Moderate OSA with AHI 15.7/h. We discussed potential treatment options. She would like to move forward with CPAP therapy. Discussed proper use and maintenance. Orders placed today for new start. Cautioned on safe driving practices.    Class 3 severe obesity due to excess calories in adult (HCC) BMI 41. Healthy weight loss encouraged   I spent 35 minutes of dedicated to the care of this patient on the date of this encounter to include pre-visit review of records, face-to-face time with the patient discussing conditions above, post visit ordering of testing, clinical documentation with the electronic health record, making appropriate referrals as documented, and communicating necessary findings to members of the patients care team.  Noemi Chapel, NP 09/15/2022  Pt aware and understands NP's role.

## 2022-09-15 NOTE — Assessment & Plan Note (Addendum)
Moderate OSA with AHI 15.7/h. We discussed potential treatment options. She would like to move forward with CPAP therapy. Discussed proper use and maintenance. Orders placed today for new start. Cautioned on safe driving practices.

## 2022-09-15 NOTE — Patient Instructions (Addendum)
Your sleep study revealed moderate obstructive sleep apnea.  We discussed how untreated sleep apnea puts an individual at risk for cardiac arrhthymias, pulm HTN, DM, stroke and increases their risk for daytime accidents. We also briefly reviewed treatment options including weight loss, side sleeping position, oral appliance, CPAP therapy or referral to ENT for possible surgical options, which would not be an option with your current BMI  Start CPAP 5-15 cmH2O with mask of choice and heated humidification  Use every night, minimum of 4-6 hours a night but goal is to wear the entire night  Change equipment every 30 days or as directed by DME. Wash your tubing with warm soap and water daily, hang to dry. Wash humidifier portion weekly.  Be aware of reduced alertness and do not drive or operate heavy machinery if experiencing this or drowsiness.  Healthy weight management discussed.  Avoid or decrease alcohol consumption and medications that make you more sleepy, if possible. Notify if persistent daytime sleepiness occurs even with consistent use of CPAP.  Continue Albuterol inhaler 2 puffs every 6 hours as needed for shortness of breath or wheezing. Notify if symptoms persist despite rescue inhaler/neb use. Continue flonase nasal spray 2 sprays each nostril daily Continue advair 1 puff Twice daily. Brush tongue and rinse mouth afterwards Continue montelukast (singulair) 1 tab At bedtime   Follow up in 12 weeks with Dr. Craige Cotta or Katie Anjolie Majer,NP. If symptoms do not improve or worsen, please contact office for sooner follow up or seek emergency care.

## 2022-09-19 NOTE — Progress Notes (Signed)
Reviewed and agree with assessment/plan.   Domenica Weightman, MD [facility] 09/19/2022, 7:03 AM Pager:  [phone]  

## 2022-10-23 ENCOUNTER — Ambulatory Visit (HOSPITAL_COMMUNITY)
Admission: EM | Admit: 2022-10-23 | Discharge: 2022-10-23 | Disposition: A | Discharge status: Home / Self Care | Payer: 59 | Attending: Emergency Medicine | Admitting: Emergency Medicine

## 2022-10-23 ENCOUNTER — Encounter (HOSPITAL_COMMUNITY): Payer: Self-pay

## 2022-10-23 DIAGNOSIS — B353 Tinea pedis: Secondary | ICD-10-CM

## 2022-10-23 DIAGNOSIS — L309 Dermatitis, unspecified: Secondary | ICD-10-CM | POA: Diagnosis not present

## 2022-10-23 MED ORDER — CLOTRIMAZOLE 1 % EX CREA: TOPICAL_CREAM | CUTANEOUS | 0 refills | Status: DC

## 2022-10-23 MED ORDER — CLOTRIMAZOLE-BETAMETHASONE 1-0.05 % EX CREA: TOPICAL_CREAM | CUTANEOUS | 0 refills | Status: DC

## 2022-10-23 NOTE — Discharge Instructions (Signed)
Apply the Clotrimazole cream between the toes twice daily.  Apply the combination Clotrimazole-Betamethasone twice daily under the right armpit  Use these for about 6 to 7 days. Please return as needed

## 2022-10-23 NOTE — ED Provider Notes (Signed)
[place]    CSN: [id number] Arrival date & time: 10/23/22  1004     History   Chief Complaint Chief Complaint  Patient presents with   Rash    HPI Colleen [name] is a 72 y.o. female.  Presents with 2 day history of rash on right foot Between the toes Not painful. Reports itching She does exercise, feet get sweaty Reports last night developed itchy spot under the right armpit  Applied peroxide and neosporin  Past Medical History:  Diagnosis Date   Acid reflux    Arthritis    Asthma    Chronic hepatitis B (HCC)    COPD (chronic obstructive pulmonary disease) (HCC)    Mild   GERD (gastroesophageal reflux disease)    History of iron deficiency anemia    with pregnancy   Hypercholesterolemia    Hypertension    Numbness and tingling of both upper extremities     Patient Active Problem List   Diagnosis Date Noted   Moderate persistent asthma 09/15/2022   Moderate obstructive sleep apnea 09/15/2022   Class 3 severe obesity due to excess calories in adult [facility]) 09/15/2022   Hypertension    COPD (chronic obstructive pulmonary disease) (HCC)    Primary localized osteoarthritis of left knee 05/24/2019   GERD (gastroesophageal reflux disease)    Hypercholesterolemia     Past Surgical History:  Procedure Laterality Date   BACK SURGERY     BILATERAL CARPAL TUNNEL RELEASE     CERVICAL SPINE SURGERY     KNEE SURGERY Left    x2   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      Home Medications    Prior to Admission medications   Medication Sig Start Date End Date Taking? Authorizing Provider  clotrimazole (LOTRIMIN) 1 % cream Apply between toes twice daily 10/23/22  Yes Lynzy Rawles, Wells Guiles, PA-C  clotrimazole-betamethasone (LOTRISONE) cream Apply under right armpit twice daily  10/23/22  Yes Takeyla Million, Wells Guiles, PA-C  albuterol (PROVENTIL HFA) 108 (90 Base) MCG/ACT inhaler Inhale 2 puffs into the lungs every 4 (four) hours as needed for wheezing or shortness of breath. 03/24/21   Hughie Closs, PA-C  amLODipine (NORVASC) 10 MG tablet Take 10 mg by mouth daily.    [provider]  entecavir (BARACLUDE) 0.5 MG tablet Take 0.5 mg by mouth daily.    [provider]  famotidine (PEPCID) 20 MG tablet Take 1 tablet (20 mg total) by mouth 2 (two) times daily for 3 days. 02/18/22 02/21/22  Chase Picket, MD  fluticasone (FLONASE) 50 MCG/ACT nasal spray Place 2 sprays into both nostrils daily. 03/24/21   Hughie Closs, PA-C  fluticasone-salmeterol (ADVAIR) 250-50 MCG/ACT AEPB Inhale 1 puff into the lungs every 12 (twelve) hours. 06/08/22   Chesley Mires, MD  gabapentin (NEURONTIN) 300 MG capsule Take 1 capsule (300 mg total) by mouth 2 (two) times daily. Patient taking differently: Take 300 mg by mouth at bedtime. 01/11/16   Narda Amber K, DO  hydrOXYzine (ATARAX) 25 MG tablet Take 1 tablet (25 mg total) by mouth every 6 (six) hours. 02/18/22   Lamptey, Myrene Galas, MD  montelukast (SINGULAIR) 10 MG tablet Take 1 tablet (10 mg total) by mouth at bedtime. 06/08/22   Chesley Mires, MD  Multiple Vitamins-Minerals (CENTRUM SILVER PO)  Take 1 tablet by mouth daily.    [provider]  omeprazole (PRILOSEC) 20 MG capsule Take 20 mg by mouth daily.  06/04/15   [provider]  vitamin E 100 UNIT capsule Take 100 Units by mouth daily.    [provider]    Family History Family History  Problem Relation Age of Onset   Cancer Mother    Cancer Father     Social History Social History   Tobacco Use   Smoking status: Never   Smokeless tobacco: Never  Vaping Use   Vaping Use: Never used  Substance Use Topics   Alcohol use: No    Alcohol/week: 0.0 standard drinks of alcohol   Drug use: No     Allergies   Shellfish allergy, Shrimp  (diagnostic), and Penicillins   Review of Systems Review of Systems  Skin:  Positive for rash.   As per HPI  Physical Exam Triage Vital Signs ED Triage Vitals  Enc Vitals Group     BP      Pulse      Resp      Temp      Temp src      SpO2      Weight      Height      Head Circumference      Peak Flow      Pain Score      Pain Loc      Pain Edu?      Excl. in GC?    No data found.  Updated Vital Signs BP (!) 168/90 (BP Location: Right Arm)   Pulse 71   Temp 98 F (36.7 C) (Oral)   Resp 12   SpO2 97%   Physical Exam Vitals and nursing note reviewed.  Constitutional:      Appearance: She is obese.  HENT:     Mouth/Throat:     Mouth: Mucous membranes are moist.     Pharynx: Oropharynx is clear.  Cardiovascular:     Rate and Rhythm: Normal rate and regular rhythm.     Heart sounds: Normal heart sounds.  Pulmonary:     Effort: Pulmonary effort is normal.     Breath sounds: Normal breath sounds.  Feet:     Comments: Moist/white skin between the great and second toe, R foot Skin:    Findings: Rash present.     Comments: Area of erythema under the right armpit, at the fold. No warmth, fluctuance, induration, drainage  Neurological:     Mental Status: She is alert and oriented to person, place, and time.    UC Treatments / Results  Labs (all labs ordered are listed, but only abnormal results are displayed) Labs Reviewed - No data to display  EKG   Radiology No results found.  Procedures Procedures (including critical care time)  Medications Ordered in UC Medications - No data to display  Initial Impression / Assessment and Plan / UC Course  I have reviewed the triage vital signs and the nursing notes.  Pertinent labs & imaging results that were available during my care of the patient were reviewed by me and considered in my medical decision making (see chart for details).  Tinea pedis Clotrimazole BID. Discussed use if \\rash  spreads. Wash  hands after touching. Keep dry, continue to wear shoes at the gym/bathroom flops  Contact dermatitis In the fold of the armpit, likely from moisture, warmth, and rubbing. Cover with steroid/antifungal combo. Lotrisone BID x  7 days.  Recommend return if symptoms do not improve with medication. Patient also has dermatologist she can follow with. Agreeable with plan  Final Clinical Impressions(s) / UC Diagnoses   Final diagnoses:  Dermatitis  Tinea pedis of right foot     Discharge Instructions      Apply the Clotrimazole cream between the toes twice daily.  Apply the combination Clotrimazole-Betamethasone twice daily under the right armpit  Use these for about 6 to 7 days. Please return as needed     ED Prescriptions     Medication Sig Dispense Auth. Provider   clotrimazole (LOTRIMIN) 1 % cream Apply between toes twice daily 15 g Pollie Poma, PA-C   clotrimazole-betamethasone (LOTRISONE) cream Apply under right armpit twice daily 15 g Algis Lehenbauer, Wells Guiles, PA-C      PDMP not reviewed this encounter.   [place], [place] 10/23/22 1147

## 2022-10-23 NOTE — ED Triage Notes (Signed)
Pt states she is here for a possible fungus under right arm pit and right foot x 2days

## 2022-10-28 ENCOUNTER — Telehealth: Payer: Self-pay | Admitting: Nurse Practitioner

## 2022-10-28 NOTE — Telephone Encounter (Signed)
Called and spoke with pt about the message from [employer]. Pt said that she was still waiting to receive a cpap machine as she has not received one after the order was placed 12/7.   Routing to [facility] for review. Please advise on this for pt.

## 2022-10-28 NOTE — Telephone Encounter (Signed)
Called [employer] and spoke to [place].  He states they contacted her in Dec to let her know they got order but having to rerun insurance after the first of the year.  He is going to give pt a call now and update her on status.  Nothing further needed for message.

## 2022-10-28 NOTE — Telephone Encounter (Signed)
Katie, please advise on results of sleep study for pt.

## 2022-10-28 NOTE — Telephone Encounter (Signed)
We went over her HST results at her appt in December, which is documented in my note. She opted for CPAP therapy and orders were placed. Is she calling to follow up on the status of her CPAP or does she have additional questions?

## 2023-02-21 ENCOUNTER — Inpatient Hospital Stay: Admission: RE | Admit: 2023-02-21 | Payer: 59 | Source: Ambulatory Visit

## 2023-03-19 ENCOUNTER — Encounter (HOSPITAL_BASED_OUTPATIENT_CLINIC_OR_DEPARTMENT_OTHER): Payer: Self-pay

## 2023-03-19 ENCOUNTER — Observation Stay (HOSPITAL_BASED_OUTPATIENT_CLINIC_OR_DEPARTMENT_OTHER)
Admission: EM | Admit: 2023-03-19 | Discharge: 2023-03-21 | Disposition: A | Discharge status: Home Health | Payer: 59 | Attending: Internal Medicine | Admitting: Internal Medicine

## 2023-03-19 ENCOUNTER — Other Ambulatory Visit: Payer: Self-pay

## 2023-03-19 ENCOUNTER — Emergency Department (HOSPITAL_BASED_OUTPATIENT_CLINIC_OR_DEPARTMENT_OTHER): Payer: 59

## 2023-03-19 DIAGNOSIS — J449 Chronic obstructive pulmonary disease, unspecified: Secondary | ICD-10-CM | POA: Insufficient documentation

## 2023-03-19 DIAGNOSIS — G629 Polyneuropathy, unspecified: Secondary | ICD-10-CM | POA: Insufficient documentation

## 2023-03-19 DIAGNOSIS — R4182 Altered mental status, unspecified: Secondary | ICD-10-CM | POA: Diagnosis not present

## 2023-03-19 DIAGNOSIS — R29818 Other symptoms and signs involving the nervous system: Secondary | ICD-10-CM

## 2023-03-19 DIAGNOSIS — J45909 Unspecified asthma, uncomplicated: Secondary | ICD-10-CM | POA: Insufficient documentation

## 2023-03-19 DIAGNOSIS — R471 Dysarthria and anarthria: Secondary | ICD-10-CM | POA: Insufficient documentation

## 2023-03-19 DIAGNOSIS — Z96652 Presence of left artificial knee joint: Secondary | ICD-10-CM | POA: Insufficient documentation

## 2023-03-19 DIAGNOSIS — R4701 Aphasia: Secondary | ICD-10-CM | POA: Insufficient documentation

## 2023-03-19 DIAGNOSIS — Z79899 Other long term (current) drug therapy: Secondary | ICD-10-CM | POA: Insufficient documentation

## 2023-03-19 DIAGNOSIS — I10 Essential (primary) hypertension: Secondary | ICD-10-CM | POA: Insufficient documentation

## 2023-03-19 DIAGNOSIS — H538 Other visual disturbances: Secondary | ICD-10-CM | POA: Diagnosis not present

## 2023-03-19 LAB — DIFFERENTIAL
Abs Immature Granulocytes: 0.01 10*3/uL (ref 0.00–0.07)
Basophils Absolute: 0 10*3/uL (ref 0.0–0.1)
Basophils Relative: 0 %
Eosinophils Absolute: 0.1 10*3/uL (ref 0.0–0.5)
Eosinophils Relative: 1 %
Immature Granulocytes: 0 %
Lymphocytes Relative: 39 %
Lymphs Abs: 2.9 10*3/uL (ref 0.7–4.0)
Monocytes Absolute: 0.6 10*3/uL (ref 0.1–1.0)
Monocytes Relative: 8 %
Neutro Abs: 3.9 10*3/uL (ref 1.7–7.7)
Neutrophils Relative %: 52 %

## 2023-03-19 LAB — TROPONIN I (HIGH SENSITIVITY)
Troponin I (High Sensitivity): 4 ng/L (ref <18)
Troponin I (High Sensitivity): 6 ng/L (ref <18)

## 2023-03-19 LAB — URINALYSIS, ROUTINE W REFLEX MICROSCOPIC
Bilirubin Urine: NEGATIVE
Glucose, UA: NEGATIVE mg/dL
Hgb urine dipstick: NEGATIVE
Ketones, ur: NEGATIVE mg/dL
Leukocytes,Ua: NEGATIVE
Nitrite: NEGATIVE
Protein, ur: NEGATIVE mg/dL
Specific Gravity, Urine: 1.008 (ref 1.005–1.030)
pH: 8 (ref 5.0–8.0)

## 2023-03-19 LAB — CBG MONITORING, ED
Glucose-Capillary: 93 mg/dL (ref 70–99)
Glucose-Capillary: 79 mg/dL (ref 70–99)

## 2023-03-19 LAB — ETHANOL: Alcohol, Ethyl (B): <10 mg/dL (ref <10)

## 2023-03-19 LAB — CBC
HCT: 39.8 % (ref 36.0–46.0)
Hemoglobin: 13.2 g/dL (ref 12.0–15.0)
MCH: 29.9 pg (ref 26.0–34.0)
MCHC: 33.2 g/dL (ref 30.0–36.0)
MCV: 90.2 fL (ref 80.0–100.0)
Platelets: 279 10*3/uL (ref 150–400)
RBC: 4.41 MIL/uL (ref 3.87–5.11)
RDW: 14.5 % (ref 11.5–15.5)
WBC: 7.5 10*3/uL (ref 4.0–10.5)
nRBC: 0 % (ref 0.0–0.2)

## 2023-03-19 LAB — RAPID URINE DRUG SCREEN, HOSP PERFORMED
Amphetamines: NOT DETECTED
Barbiturates: NOT DETECTED
Benzodiazepines: NOT DETECTED
Cocaine: NOT DETECTED
Opiates: NOT DETECTED
Tetrahydrocannabinol: NOT DETECTED

## 2023-03-19 LAB — APTT: aPTT: 30 seconds (ref 24–36)

## 2023-03-19 LAB — COMPREHENSIVE METABOLIC PANEL
ALT: 8 U/L (ref 0–44)
AST: 19 U/L (ref 15–41)
Albumin: 4.2 g/dL (ref 3.5–5.0)
Alkaline Phosphatase: 81 U/L (ref 38–126)
Anion gap: 10 (ref 5–15)
BUN: 9 mg/dL (ref 8–23)
CO2: 27 mmol/L (ref 22–32)
Calcium: 9.6 mg/dL (ref 8.9–10.3)
Chloride: 101 mmol/L (ref 98–111)
Creatinine, Ser: 0.62 mg/dL (ref 0.44–1.00)
GFR, Estimated: >60 mL/min (ref >60)
Glucose, Bld: 87 mg/dL (ref 70–99)
Potassium: 3.9 mmol/L (ref 3.5–5.1)
Sodium: 138 mmol/L (ref 135–145)
Total Bilirubin: 0.3 mg/dL (ref 0.3–1.2)
Total Protein: 9.1 g/dL — ABNORMAL HIGH (ref 6.5–8.1)

## 2023-03-19 LAB — PROTIME-INR
INR: 1 (ref 0.8–1.2)
Prothrombin Time: 13.2 seconds (ref 11.4–15.2)

## 2023-03-19 MED ORDER — ATORVASTATIN CALCIUM 80 MG PO TABS: 80.0000 mg | ORAL_TABLET | Freq: Every day | ORAL | Status: DC

## 2023-03-19 MED ORDER — ONDANSETRON HCL 4 MG/2ML IJ SOLN: 4.0000 mg | Freq: Four times a day (QID) | INTRAMUSCULAR | Status: DC | PRN

## 2023-03-19 MED ORDER — ACETAMINOPHEN 500 MG PO TABS
500.0000 mg | ORAL_TABLET | Freq: Four times a day (QID) | ORAL | Status: DC | PRN
  Administered 2023-03-20: 500 mg via ORAL
  Filled 2023-03-19: qty 1

## 2023-03-19 MED ORDER — LABETALOL HCL 5 MG/ML IV SOLN: 5.0000 mg | INTRAVENOUS | Status: DC | PRN

## 2023-03-19 MED ORDER — ENOXAPARIN SODIUM 40 MG/0.4ML IJ SOSY
40.0000 mg | PREFILLED_SYRINGE | INTRAMUSCULAR | Status: DC
  Administered 2023-03-19 – 2023-03-20 (×2): 40 mg via SUBCUTANEOUS
  Filled 2023-03-19 (×2): qty 0.4

## 2023-03-19 MED ORDER — MELATONIN 5 MG PO TABS
5.0000 mg | ORAL_TABLET | Freq: Every evening | ORAL | Status: DC | PRN
  Administered 2023-03-20 (×2): 5 mg via ORAL
  Filled 2023-03-19 (×2): qty 1

## 2023-03-19 MED ORDER — ADULT MULTIVITAMIN W/MINERALS CH
1.0000 | ORAL_TABLET | Freq: Every day | ORAL | Status: DC
  Administered 2023-03-20 – 2023-03-21 (×2): 1 via ORAL
  Filled 2023-03-19 (×2): qty 1

## 2023-03-19 MED ORDER — GABAPENTIN 300 MG PO CAPS
300.0000 mg | ORAL_CAPSULE | Freq: Two times a day (BID) | ORAL | Status: DC
  Administered 2023-03-20 – 2023-03-21 (×4): 300 mg via ORAL
  Filled 2023-03-19 (×4): qty 1

## 2023-03-19 MED ORDER — CLOTRIMAZOLE 1 % EX CREA
TOPICAL_CREAM | Freq: Two times a day (BID) | CUTANEOUS | Status: DC
  Filled 2023-03-19: qty 15

## 2023-03-19 MED ORDER — FENTANYL CITRATE PF 50 MCG/ML IJ SOSY
25.0000 ug | PREFILLED_SYRINGE | Freq: Once | INTRAMUSCULAR | Status: AC
  Administered 2023-03-19: 25 ug via INTRAVENOUS
  Filled 2023-03-19: qty 1

## 2023-03-19 MED ORDER — ASPIRIN 81 MG PO TBEC
81.0000 mg | DELAYED_RELEASE_TABLET | Freq: Every day | ORAL | Status: DC
  Administered 2023-03-20 – 2023-03-21 (×2): 81 mg via ORAL
  Filled 2023-03-19 (×2): qty 1

## 2023-03-19 MED ORDER — POLYETHYLENE GLYCOL 3350 17 G PO PACK: 17.0000 g | PACK | Freq: Every day | ORAL | Status: DC | PRN

## 2023-03-19 MED ORDER — MONTELUKAST SODIUM 10 MG PO TABS
10.0000 mg | ORAL_TABLET | Freq: Every day | ORAL | Status: DC
  Administered 2023-03-20 (×2): 10 mg via ORAL
  Filled 2023-03-19 (×2): qty 1

## 2023-03-19 MED ORDER — ENTECAVIR 0.5 MG PO TABS: 0.5000 mg | ORAL_TABLET | Freq: Every day | ORAL | Status: DC

## 2023-03-19 MED ORDER — PANTOPRAZOLE SODIUM 40 MG PO TBEC
40.0000 mg | DELAYED_RELEASE_TABLET | Freq: Every day | ORAL | Status: DC
  Administered 2023-03-20 – 2023-03-21 (×2): 40 mg via ORAL
  Filled 2023-03-19 (×2): qty 1

## 2023-03-19 MED ORDER — SODIUM CHLORIDE 0.9 % IV SOLN: INTRAVENOUS | Status: AC
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
## 2023-03-19 NOTE — Plan of Care (Signed)
  Problem: Education: Goal: Knowledge of General Education information will improve Description: Including pain rating scale, medication(s)/side effects and non-pharmacologic comfort measures Outcome: Progressing   Problem: Health Behavior/Discharge Planning: Goal: Ability to manage health-related needs will improve Outcome: Progressing   Problem: Clinical Measurements: Goal: Ability to maintain clinical measurements within normal limits will improve Outcome: Progressing   Problem: Education: Goal: Knowledge of disease or condition will improve Outcome: Progressing Goal: Knowledge of secondary prevention will improve (MUST DOCUMENT ALL) Outcome: Progressing Goal: Knowledge of patient specific risk factors will improve (Mark N/A or DELETE if not current risk factor) Outcome: Progressing   Problem: Ischemic Stroke/TIA Tissue Perfusion: Goal: Complications of ischemic stroke/TIA will be minimized Outcome: Progressing   Problem: Coping: Goal: Will verbalize positive feelings about self Outcome: Progressing Goal: Will identify appropriate support needs Outcome: Progressing   Problem: Health Behavior/Discharge Planning: Goal: Ability to manage health-related needs will improve Outcome: Progressing Goal: Goals will be collaboratively established with patient/family Outcome: Progressing   Problem: Self-Care: Goal: Ability to participate in self-care as condition permits will improve Outcome: Progressing Goal: Verbalization of feelings and concerns over difficulty with self-care will improve Outcome: Progressing Goal: Ability to communicate needs accurately will improve Outcome: Progressing   

## 2023-03-19 NOTE — H&P (Signed)
History and Physical  Graceanna [facility] ZOX:[id number] DOB: May 08, 1951 DOA: 03/19/2023  Referring physician: Accepted by Dr. Caleb Popp, [facility], Hospitalist service.  PCP: Patient, No Pcp Per  Outpatient Specialists: None Patient coming from: Home through [facility]  Chief Complaint: Difficulty writing, difficulty getting words out, dizziness, confusion.   HPI: Natalya P Chuang is a 72 y.o. female with medical history significant for obesity, hypertension, chronic hepatitis B on maintenance Entecavir, cirrhosis of the liver, allergic asthma, OSA, peripheral neuropathy, ambulatory dysfunction, uses a walker at baseline, who initially presented to [facility] ED due to altered mental status, sudden onset difficulties with writing, aphasia and dizziness, with last known well around 4 AM today.    Per the patient, around 4 AM she woke up and tried to write her name on envelope to pay her tithes and offerings but was unable to write her name.  She returned to bed and woke up again around 7:45 AM.  After waking up, she felt dizzy and still got in the shower.  Did not fall.  Around 8:45 AM, she got in the car with her daughter in the passenger seat.  Her daughter asked the patient to put her seatbelt on however she was unable to comprehend and instead raised up her pocket book and showed it to her daughter, this happened 3 times.  Additionally, the patient drove past the church twice.  This was unusual for the patient who is used to driving to church.  While in the church, she babbled and her speech was abnormal.  A church member recommended that she gets checked out.  Her daughter drove her to [facility] ED.  In the ED, the patient's BP was noted to be elevated.  Noncontrast head CT revealed the following findings: 1. No acute intracranial abnormality. 2. Moderate chronic small-vessel disease.  EDP curb sided with neurology who recommended admission for stroke workup.  Admitted by [facility], hospitalist service  with telemetry medical unit as observation status.  At the time of this visit, the patient reports dizziness with standing up.  Neurology/stroke team will see in consultation.  ED Course: Temperature 98.3.  BP 124/75.  Pulse of 84, respiratory 18, saturation 95% on room air.  Lab studies essentially unremarkable.  Review of Systems: Review of systems as noted in the HPI. All other systems reviewed and are negative.   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 She has never used smokeless tobacco. She reports that she does not drink alcohol and does not use drugs.   Allergies  Allergen Reactions   Shellfish Allergy Shortness Of Breath and Swelling   Shrimp (Diagnostic) Anaphylaxis   Penicillins  Hives, Swelling and Other (See Comments)    Did it involve swelling of the face/tongue/throat, SOB, or low BP? Yes Did it involve sudden or severe rash/hives, skin peeling, or any reaction on the inside of your mouth or nose? Yes Did you need to seek medical attention at a hospital or doctor's office? Yes When did it last happen? Late 20's If all above answers are "NO", may proceed with cephalosporin use.     Family History  Problem Relation Age of Onset   Cancer Mother    Cancer Father        Prior to Admission medications   Medication Sig Start Date End Date Taking? Authorizing Provider  albuterol (PROVENTIL HFA) 108 (90 Base) MCG/ACT inhaler Inhale 2 puffs into the lungs every 4 (four) hours as needed for wheezing or shortness of breath. 03/24/21   Rushie Chestnut, PA-C  amLODipine (NORVASC) 10 MG tablet Take 10 mg by mouth daily.    [provider]  clotrimazole (LOTRIMIN) 1 % cream Apply between toes twice daily 10/23/22   Rising, Lurena Joiner, PA-C  clotrimazole-betamethasone (LOTRISONE) cream Apply under right armpit twice daily 10/23/22   Rising, Lurena Joiner, PA-C  entecavir (BARACLUDE) 0.5 MG tablet Take 0.5 mg by mouth daily.    [provider]  famotidine (PEPCID) 20 MG tablet Take 1 tablet (20 mg total) by mouth 2 (two) times daily for 3 days. 02/18/22 02/21/22  Merrilee Jansky, MD  fluticasone (FLONASE) 50 MCG/ACT nasal spray Place 2 sprays into both nostrils daily. 03/24/21   Rushie Chestnut, PA-C  fluticasone-salmeterol (ADVAIR) 250-50 MCG/ACT AEPB Inhale 1 puff into the lungs every 12 (twelve) hours. 06/08/22   Coralyn Helling, MD  gabapentin (NEURONTIN) 300 MG capsule Take 1 capsule (300 mg total) by mouth 2 (two) times daily. Patient taking differently: Take 300 mg by mouth at bedtime. 01/11/16   Nita Sickle K, DO  hydrOXYzine (ATARAX) 25 MG tablet Take 1 tablet (25 mg total) by mouth every 6 (six) hours. 02/18/22   Lamptey, Britta Mccreedy, MD  montelukast (SINGULAIR) 10 MG tablet Take 1 tablet (10 mg total) by mouth at bedtime. 06/08/22   Coralyn Helling, MD  Multiple Vitamins-Minerals (CENTRUM SILVER PO) Take 1 tablet by mouth daily.    [provider]  omeprazole (PRILOSEC) 20 MG capsule Take 20 mg by mouth daily.  06/04/15   [provider]  vitamin E 100 UNIT capsule Take 100 Units by mouth daily.    [provider]    Physical Exam: BP (!) 167/107 (BP Location: Left Arm)   Pulse 97   Temp 99.2 F (37.3 C) (Oral)   Resp 17   SpO2  98%   General: 72 y.o. year-old female well developed well nourished in no acute distress.  Alert and oriented x3. Cardiovascular: Regular rate and rhythm with no rubs or gallops.  No thyromegaly or JVD noted.  No lower extremity edema. 2/4 pulses in all 4 extremities. Respiratory: Clear to auscultation with no wheezes or rales. Good inspiratory effort. Abdomen: Soft nontender nondistended with normal bowel sounds x4 quadrants. Muskuloskeletal: No cyanosis, clubbing or edema noted bilaterally Neuro: CN II-XII intact, strength, sensation, reflexes Skin: No ulcerative lesions noted or rashes Psychiatry: Judgement and insight appear normal. Mood is appropriate for condition and setting          Labs on Admission:  Basic Metabolic Panel: Recent Labs  Lab 03/19/23 1106  NA 138  K 3.9  CL 101  CO2 27  GLUCOSE 87  BUN 9  CREATININE 0.62  CALCIUM 9.6   Liver Function Tests: Recent Labs  Lab 03/19/23 1106  AST 19  ALT 8  ALKPHOS 81  BILITOT 0.3  PROT 9.1*  ALBUMIN 4.2   No results for input(s): "LIPASE", "AMYLASE" in the last 168 hours. No results for input(s): "AMMONIA" in the last 168 hours. CBC: Recent Labs  Lab 03/19/23 1106  WBC 7.5  NEUTROABS 3.9  HGB 13.2  HCT 39.8  MCV 90.2  PLT 279   Cardiac Enzymes: No results for input(s): "CKTOTAL", "CKMB", "CKMBINDEX", "TROPONINI" in the last 168 hours.  BNP (last 3 results) No results for input(s): "BNP" in the last 8760 hours.  ProBNP (last 3 results) No results for input(s): "PROBNP" in the last 8760 hours.  CBG: Recent Labs  Lab 03/19/23 1058 03/19/23 1435  GLUCAP 93 79    Radiological Exams on Admission: CT HEAD WO CONTRAST  Result Date: 03/19/2023 CLINICAL DATA:  Neuro deficit, acute, stroke suspected. EXAM: CT HEAD WITHOUT CONTRAST TECHNIQUE: Contiguous axial images were obtained from the base of the skull through the vertex without intravenous contrast. RADIATION DOSE REDUCTION: This exam was performed  according to the departmental dose-optimization program which includes automated exposure control, adjustment of the mA and/or kV according to patient size and/or use of iterative reconstruction technique. COMPARISON:  Head CT 05/27/2004. FINDINGS: Brain: No acute hemorrhage. Patchy hypoattenuation of the cerebral white matter, most consistent with moderate chronic small-vessel disease. No hydrocephalus or extra-axial collection. No mass effect or midline shift. Vascular: No hyperdense vessel or unexpected calcification. Skull: No calvarial fracture or suspicious bone lesion. Skull base is unremarkable. Sinuses/Orbits: Unremarkable. Other: None. IMPRESSION: 1. No acute intracranial abnormality. 2. Moderate chronic small-vessel disease. Electronically Signed   By: Orvan Falconer M.D.   On: 03/19/2023 11:48    EKG: I independently viewed the EKG done and my findings are as followed: Sinus rhythm rate of 76.  Nonspecific ST changes.  QTc 429.  Assessment/Plan Present on Admission:  Altered mental status, unspecified  Principal Problem:   Altered mental status, unspecified  Altered mental status, TIA versus CVA versus delirium Noncontrast CT head nonacute Follow MRI brain and 2D echo. Obtain CT angio head and neck if okay with neurology Suspected lipid panel, A1c Monitor on telemetry PT/OT/speech therapist evaluation Aspirin and Plavix 21 days dual antiplatelet monotherapy. Monitor on telemetry Okay permissive hypertension  Essential hypertension Ongoing permissive hypertension Treat SBP greater than 220 or DBP greater than 120.  Allergic asthma Resume home regimen  GERD Resume home regimen  Polyneuropathy Resume home regimen  Obesity Recommend weight loss outpatient with regular physical activity and healthy dieting.    DVT prophylaxis: Subcu Lovenox daily  Code Status: Full code.  Family Communication: None at bedside  Disposition Plan: Admitted to telemetry cardiac  unit  Consults called: Neurology.  Admission status: Observation status.   Status is: Observation    Darlin Drop MD [facility] Pager [phone]  If 7PM-7AM, please contact night-coverage [email or web address] Password [facility]  03/19/2023, 9:47 PM

## 2023-03-19 NOTE — ED Notes (Signed)
Called [facility] -- informed that pt bed assignment is ready 

## 2023-03-19 NOTE — Plan of Care (Signed)
Plan of Care Note for accepted transfer   Patient: Colleen [name] MRN: [id number]   DOA: 03/19/2023  Facility requesting transfer: [facility] Requesting Provider: Meridee Score, MD Reason for transfer: Concern for stroke Facility course: CT head negative for stroke. Teleneurology consulted and recommended admission for stroke workup  Plan of care: The patient is accepted for admission to Telemetry unit, at [facility]. Patient presented after noticing difficulty writing early this morning after waking up at around 4 am. Family noted abnormal speech pattern and patient had difficulty with driving while attempting to go to church, missing her turns and getting lost. Neurology recommending stroke workup.  Author: Jacquelin Hawking, MD 03/19/2023  Check [email or web address] for on-call coverage.  Nursing staff, Please call [facility] Admits & Consults System-Wide number on Amion as soon as patient's arrival, so appropriate admitting provider can evaluate the pt.

## 2023-03-19 NOTE — ED Notes (Signed)
Patient transported to CT 

## 2023-03-20 ENCOUNTER — Observation Stay (HOSPITAL_COMMUNITY): Payer: 59

## 2023-03-20 ENCOUNTER — Observation Stay (HOSPITAL_BASED_OUTPATIENT_CLINIC_OR_DEPARTMENT_OTHER): Payer: 59

## 2023-03-20 DIAGNOSIS — I10 Essential (primary) hypertension: Secondary | ICD-10-CM | POA: Diagnosis not present

## 2023-03-20 DIAGNOSIS — R569 Unspecified convulsions: Secondary | ICD-10-CM | POA: Diagnosis not present

## 2023-03-20 DIAGNOSIS — E785 Hyperlipidemia, unspecified: Secondary | ICD-10-CM | POA: Diagnosis not present

## 2023-03-20 DIAGNOSIS — R29818 Other symptoms and signs involving the nervous system: Secondary | ICD-10-CM

## 2023-03-20 DIAGNOSIS — G459 Transient cerebral ischemic attack, unspecified: Secondary | ICD-10-CM

## 2023-03-20 DIAGNOSIS — R4182 Altered mental status, unspecified: Secondary | ICD-10-CM | POA: Diagnosis not present

## 2023-03-20 DIAGNOSIS — R471 Dysarthria and anarthria: Secondary | ICD-10-CM | POA: Diagnosis not present

## 2023-03-20 DIAGNOSIS — F419 Anxiety disorder, unspecified: Secondary | ICD-10-CM

## 2023-03-20 LAB — HIV ANTIBODY (ROUTINE TESTING W REFLEX): HIV Screen 4th Generation wRfx: NONREACTIVE

## 2023-03-20 LAB — LIPID PANEL
Cholesterol: 162 mg/dL (ref 0–200)
HDL: 36 mg/dL — ABNORMAL LOW (ref >40)
LDL Cholesterol: 111 mg/dL — ABNORMAL HIGH (ref 0–99)
Total CHOL/HDL Ratio: 4.5 RATIO
Triglycerides: 73 mg/dL (ref <150)
VLDL: 15 mg/dL (ref 0–40)

## 2023-03-20 LAB — HEMOGLOBIN A1C
Hgb A1c MFr Bld: 5.6 % (ref 4.8–5.6)
Mean Plasma Glucose: 114.02 mg/dL

## 2023-03-20 LAB — CBC
HCT: 39.1 % (ref 36.0–46.0)
Hemoglobin: 12.7 g/dL (ref 12.0–15.0)
MCH: 30 pg (ref 26.0–34.0)
MCHC: 32.5 g/dL (ref 30.0–36.0)
MCV: 92.4 fL (ref 80.0–100.0)
Platelets: 243 10*3/uL (ref 150–400)
RBC: 4.23 MIL/uL (ref 3.87–5.11)
RDW: 14.5 % (ref 11.5–15.5)
WBC: 6.7 10*3/uL (ref 4.0–10.5)
nRBC: 0 % (ref 0.0–0.2)

## 2023-03-20 LAB — ECHOCARDIOGRAM COMPLETE
Area-P 1/2: 3.85 cm2
S' Lateral: 2.2 cm

## 2023-03-20 LAB — BASIC METABOLIC PANEL
Anion gap: 12 (ref 5–15)
BUN: 10 mg/dL (ref 8–23)
CO2: 21 mmol/L — ABNORMAL LOW (ref 22–32)
Calcium: 9.4 mg/dL (ref 8.9–10.3)
Chloride: 102 mmol/L (ref 98–111)
Creatinine, Ser: 0.74 mg/dL (ref 0.44–1.00)
GFR, Estimated: >60 mL/min (ref >60)
Glucose, Bld: 98 mg/dL (ref 70–99)
Potassium: 3.9 mmol/L (ref 3.5–5.1)
Sodium: 135 mmol/L (ref 135–145)

## 2023-03-20 LAB — MAGNESIUM: Magnesium: 2.1 mg/dL (ref 1.7–2.4)

## 2023-03-20 LAB — PHOSPHORUS: Phosphorus: 4.2 mg/dL (ref 2.5–4.6)

## 2023-03-20 MED ORDER — ATORVASTATIN CALCIUM 40 MG PO TABS
40.0000 mg | ORAL_TABLET | Freq: Every day | ORAL | Status: DC
  Administered 2023-03-20 – 2023-03-21 (×2): 40 mg via ORAL
  Filled 2023-03-20 (×2): qty 1

## 2023-03-20 MED ORDER — CLOPIDOGREL BISULFATE 75 MG PO TABS
75.0000 mg | ORAL_TABLET | Freq: Every day | ORAL | Status: DC
  Administered 2023-03-20: 75 mg via ORAL
  Filled 2023-03-20: qty 1

## 2023-03-20 MED ORDER — FLUTICASONE PROPIONATE 50 MCG/ACT NA SUSP
2.0000 | Freq: Every day | NASAL | Status: DC
  Administered 2023-03-20 – 2023-03-21 (×2): 2 via NASAL
  Filled 2023-03-20: qty 16

## 2023-03-20 MED ORDER — ALBUTEROL SULFATE (2.5 MG/3ML) 0.083% IN NEBU: 3.0000 mL | INHALATION_SOLUTION | RESPIRATORY_TRACT | Status: DC | PRN

## 2023-03-20 MED ORDER — MOMETASONE FURO-FORMOTEROL FUM 200-5 MCG/ACT IN AERO
2.0000 | INHALATION_SPRAY | Freq: Two times a day (BID) | RESPIRATORY_TRACT | Status: DC
  Administered 2023-03-20 – 2023-03-21 (×2): 2 via RESPIRATORY_TRACT
  Filled 2023-03-20: qty 8.8

## 2023-03-20 MED ORDER — LORAZEPAM 0.5 MG PO TABS
0.5000 mg | ORAL_TABLET | Freq: Once | ORAL | Status: AC | PRN
  Administered 2023-03-20: 0.5 mg via ORAL
  Filled 2023-03-20: qty 1

## 2023-03-20 MED ORDER — IOHEXOL 350 MG/ML SOLN
75.0000 mL | Freq: Once | INTRAVENOUS | Status: AC | PRN
  Administered 2023-03-20: 75 mL via INTRAVENOUS

## 2023-03-20 NOTE — Progress Notes (Addendum)
STROKE TEAM PROGRESS NOTE   INTERVAL HISTORY Her family is at the bedside.  And is in the bed in no apparent distress MRI brain with no acute process.  EEG with normal with generalized excessive beta no seizures or epileptiform discharges seen.  Vitals:   03/19/23 2158 03/19/23 2317 03/20/23 0315 03/20/23 0855  BP: (!) 153/95 124/75 94/67 (!) 171/88  Pulse: 83 74 65 79  Resp: 18 18 18 20   Temp: 98.4 F (36.9 C) 98.3 F (36.8 C) 98 F (36.7 C) 98.8 F (37.1 C)  TempSrc: Oral Oral Oral Oral  SpO2: 94% 95% 96% 99%   CBC:  Recent Labs  Lab 03/19/23 1106 03/20/23 0732  WBC 7.5 6.7  NEUTROABS 3.9  --   HGB 13.2 12.7  HCT 39.8 39.1  MCV 90.2 92.4  PLT 279 243   Basic Metabolic Panel:  Recent Labs  Lab 03/19/23 1106 03/20/23 0732  NA 138 135  K 3.9 3.9  CL 101 102  CO2 27 21*  GLUCOSE 87 98  BUN 9 10  CREATININE 0.62 0.74  CALCIUM 9.6 9.4  MG  --  2.1  PHOS  --  4.2   Lipid Panel:  Recent Labs  Lab 03/20/23 0732  CHOL 162  TRIG 73  HDL 36*  CHOLHDL 4.5  VLDL 15  LDLCALC 161*   HgbA1c:  Recent Labs  Lab 03/20/23 0732  HGBA1C 5.6   Urine Drug Screen:  Recent Labs  Lab 03/19/23 1118  LABOPIA NONE DETECTED  COCAINSCRNUR NONE DETECTED  LABBENZ NONE DETECTED  AMPHETMU NONE DETECTED  THCU NONE DETECTED  LABBARB NONE DETECTED    Alcohol Level  Recent Labs  Lab 03/19/23 1106  ETH <10    IMAGING past 24 hours CT ANGIO HEAD NECK W WO CM  Result Date: 03/20/2023 CLINICAL DATA:  Neuro deficit with stroke suspected EXAM: CT ANGIOGRAPHY HEAD AND NECK WITH AND WITHOUT CONTRAST TECHNIQUE: Multidetector CT imaging of the head and neck was performed using the standard protocol during bolus administration of intravenous contrast. Multiplanar CT image reconstructions and MIPs were obtained to evaluate the vascular anatomy. Carotid stenosis measurements (when applicable) are obtained utilizing NASCET criteria, using the distal internal carotid diameter as the  denominator. RADIATION DOSE REDUCTION: This exam was performed according to the departmental dose-optimization program which includes automated exposure control, adjustment of the mA and/or kV according to patient size and/or use of iterative reconstruction technique. CONTRAST:  75mL OMNIPAQUE IOHEXOL 350 MG/ML SOLN COMPARISON:  Head CT from yesterday FINDINGS: CTA NECK FINDINGS Aortic arch: Atheromatous calcification with 3 vessel branching Right carotid system: Partial retropharyngeal course. No significant atheromatous changes. Left carotid system: Partial retropharyngeal course. No significant atheromatous changes. No stenosis or beading. Vertebral arteries: No proximal subclavian or vertebral stenosis. Symmetric vertebral tortuosity without beading or dissection. Skeleton: Cervical spine degeneration and fusion. No acute or aggressive finding. Other neck: No acute finding Upper chest: Clear apical lungs. Review of the MIP images confirms the above findings CTA HEAD FINDINGS Anterior circulation: No significant stenosis, proximal occlusion, aneurysm, or vascular malformation. Posterior circulation: No significant stenosis, proximal occlusion, aneurysm, or vascular malformation. Venous sinuses: Patent when accounting for arachnoid granulation at the left transverse sigmoid junction. Anterior diverticulum at the right transverse sigmoid junction. Anatomic variants: Hypoplastic P1 segments. Review of the MIP images confirms the above findings IMPRESSION: No emergent finding. Less than typical atheromatous changes for age with no significant stenosis of major arteries in the head and neck. Electronically Signed  By: Tiburcio Pea M.D.   On: 03/20/2023 05:55   CT HEAD WO CONTRAST  Result Date: 03/19/2023 CLINICAL DATA:  Neuro deficit, acute, stroke suspected. EXAM: CT HEAD WITHOUT CONTRAST TECHNIQUE: Contiguous axial images were obtained from the base of the skull through the vertex without intravenous  contrast. RADIATION DOSE REDUCTION: This exam was performed according to the departmental dose-optimization program which includes automated exposure control, adjustment of the mA and/or kV according to patient size and/or use of iterative reconstruction technique. COMPARISON:  Head CT 05/27/2004. FINDINGS: Brain: No acute hemorrhage. Patchy hypoattenuation of the cerebral white matter, most consistent with moderate chronic small-vessel disease. No hydrocephalus or extra-axial collection. No mass effect or midline shift. Vascular: No hyperdense vessel or unexpected calcification. Skull: No calvarial fracture or suspicious bone lesion. Skull base is unremarkable. Sinuses/Orbits: Unremarkable. Other: None. IMPRESSION: 1. No acute intracranial abnormality. 2. Moderate chronic small-vessel disease. Electronically Signed   By: Orvan Falconer M.D.   On: 03/19/2023 11:48    PHYSICAL EXAM  Temp:  [97.9 F (36.6 C)-99.2 F (37.3 C)] 98.3 F (36.8 C) (06/10 1203) Pulse Rate:  [65-97] 95 (06/10 1203) Resp:  [16-20] 20 (06/10 1203) BP: (94-171)/(67-107) 120/92 (06/10 1203) SpO2:  [94 %-99 %] 95 % (06/10 1203)  General - Well nourished, well developed, in no apparent distress. Cardiovascular - Regular rhythm and rate.  Mental Status -  Level of arousal and orientation to time, place, and person were intact. Language including expression, naming, repetition, comprehension was assessed and found intact. Attention span and concentration were normal. Recent and remote memory were intact. Fund of Knowledge was assessed and was intact.  Cranial Nerves II - XII - II - Visual field intact OU. III, IV, VI - Extraocular movements intact. V - Facial sensation intact bilaterally. VII - Facial movement intact bilaterally. VIII - Hearing & vestibular intact bilaterally. X - Palate elevates symmetrically. XI - Chin turning & shoulder shrug intact bilaterally. XII - Tongue protrusion intact.  Motor Strength -  The patient's strength was normal in all extremities and pronator drift was absent.  Bulk was normal and fasciculations were absent.   Motor Tone - Muscle tone was assessed at the neck and appendages and was normal.  Sensory - Light touch, temperature/pinprick were assessed and were symmetrical.    Coordination - The patient had normal movements in the hands and feet with no ataxia or dysmetria.  Tremor was absent.  Gait and Station - deferred.  ASSESSMENT/PLAN Ms. Colleen [name] is a 72 y.o. female with history of hypertension, hyperlipidemia, GERD, asthma, COPD, chronic hepatitis B, iron deficiency anemia, presenting with altered mental status unsteady gait and left-sided numbness.   Anxiety vs. Encephalopathy  Pt intermittent stuttering, distractable. Pt and family admitted a lot of stress recently with her job.  CT head No acute abnormality. Small vessel disease.  CTA head & neck no LVO MRI no acute process 2D Echo EF 60 to 65%.  EEG normal limits  LDL 111 HgbA1c 5.6 UDS negative VTE prophylaxis -Lovenox Aspirin 81 mg daily prior to admission, now on aspirin 81 mg daily.  Continue on discharge Therapy recommendations: Home health PT Disposition: Pending, recommend mental relaxation and adequate rest/sleep  Hypertension Home meds: Amlodipine 10 mg, hydrochlorothiazide 25 mg, Stable Long-term BP goal normotensive  Hyperlipidemia Home meds: None LDL 111, goal < 70 Add atorvastatin 40 Mg Continue statin at discharge  Other Stroke Risk Factors Advanced Age >/= 81  Obesity, There is no height or  weight on file to calculate BMI., BMI >/= 30 associated with increased stroke risk, recommend weight loss, diet and exercise as appropriate   Other Active Problems Chronic hep B Polyneuropathy GERD COPD  Hospital day # 0  Gevena Mart DNP, ACNPC-AG  [facility]  ATTENDING NOTE: I reviewed above note and agree with the assessment and plan. Pt was seen and  examined.   Daughters, granddaughters and grandson are at bedside.  Patient lying in bed, stated that once today she had a sharp pain and right neck put on lidocaine patch.  Sunday she felt dizzy in the early morning 4 AM, then not able to write with right hand, 7 AM still has dizziness imbalance, missed church twice on driving, feeling confused before coming to ER.  On exam, patient awake alert, AOx3, no focal deficit.  However intermittent stuttering speech, distractible.  Patient and family admitted recent tremendous stress from her job.  Stroke workup negative including MRI, EEG, 2D echo, CT, and CT head and neck.  Patient symptoms concerning for anxiety related, recommend mental relaxation and adequate rest/sleep.  Continue home aspirin, add Lipitor 40.  PT OT recommend home health.  For detailed assessment and plan, please refer to above/below as I have made changes wherever appropriate.   Neurology will sign off. Please call with questions. Pt will follow up with Dr. Allena Katz at [facility] in about 4 weeks. Thanks for the consult.   Marvel Plan, MD PhD Stroke Neurology 03/20/2023 6:48 PM  I discussed with Dr. Rito Ehrlich. I spent extra 30 inpatient face-to-face time with the patient, more than 50% of which was spent in counseling and coordination of care, reviewing test results, images and medication, and discussing the diagnosis, treatment plan and potential prognosis. This patient's care requiresreview of multiple databases, neurological assessment, discussion with family, other specialists and medical decision making of high complexity.      To contact Stroke Continuity provider, please refer to [email or web address]. After hours, contact General Neurology

## 2023-03-20 NOTE — Progress Notes (Signed)
Patient off the unit for ECHO

## 2023-03-20 NOTE — Evaluation (Signed)
Clinical/Bedside Swallow Evaluation Patient Details  Name: Colleen [name] MRN: [id number] Date of Birth: 07/21/1951  Today's Date: 03/20/2023 Time: SLP Start Time (ACUTE ONLY): 1413 SLP Stop Time (ACUTE ONLY): 1427 SLP Time Calculation (min) (ACUTE ONLY): 14 min  Past Medical History:  Past Medical History:  Diagnosis Date   Acid reflux    Arthritis    Asthma    Chronic hepatitis B (HCC)    COPD (chronic obstructive pulmonary disease) (HCC)    Mild   GERD (gastroesophageal reflux disease)    History of iron deficiency anemia    with pregnancy   Hypercholesterolemia    Hypertension    Numbness and tingling of both upper extremities    Past Surgical History:  Past Surgical History:  Procedure Laterality Date   BACK SURGERY     BILATERAL CARPAL TUNNEL RELEASE     CERVICAL SPINE SURGERY     KNEE SURGERY Left    x2   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 who presented 6/9 with altered mental status, unsteady gait, left sided numbness. MRI brain negative for acute changes. EEG WNL. PMH: chronic hepatitis B, asthma, COPD, HTN.    Assessment / Plan / Recommendation  Clinical Impression  Pt was seen for bedside swallow evaluation with her daughters present and all parties denied the pt having a history of dysphagia. Oral mechanism exam was Colleen [name] and her natural dentition was adequate. She tolerated all solids and liquids without signs or symptoms of oropharyngeal dysphagia. A regular texture diet with thin liquids is recommended at this time. Further skilled SLP services are not clinically indicated for swallowing, but SLP's speech-language evaluation will be completed subsequently. SLP Visit Diagnosis: Dysphagia, unspecified (R13.10)    Aspiration Risk  No limitations    Diet Recommendation Regular;Thin liquid   Liquid  Administration via: Cup;Straw Medication Administration: Whole meds with liquid Supervision: Patient able to self feed Postural Changes: Seated upright at 90 degrees    Other  Recommendations Oral Care Recommendations: Oral care BID    Recommendations for follow up therapy are one component of a multi-disciplinary discharge planning process, led by the attending physician.  Recommendations may be updated based on patient status, additional functional criteria and insurance authorization.  Follow up Recommendations        Assistance Recommended at Discharge    Functional Status Assessment Patient has not had a recent decline in their functional status  Frequency and Duration            Prognosis        Swallow Study   General Date of Onset: 03/19/23 HPI: Pt is a 72 y.o. who presented 6/9 with altered mental status, unsteady gait, left sided numbness. MRI brain negative for acute changes. EEG WNL. PMH: chronic hepatitis B, asthma, COPD, HTN. Type of Study: Bedside Swallow Evaluation Previous Swallow Assessment: none Diet Prior to this Study: Regular;Thin liquids (Level 0) Temperature Spikes Noted: No Respiratory Status: Room air History of Recent Intubation: No Behavior/Cognition: Alert;Cooperative;Pleasant mood Oral Cavity Assessment: Within Functional Limits Oral Care Completed by SLP: No Oral Cavity - Dentition: Adequate natural dentition;Missing dentition Vision: Functional for self-feeding Self-Feeding Abilities: Able to feed self Patient Positioning: Upright in bed;Postural control adequate for testing Baseline Vocal Quality: Normal Volitional Swallow: Able to elicit    Oral/Motor/Sensory Function Overall Oral Motor/Sensory  Function: Within functional limits   Ice Chips Ice chips: Not tested   Thin Liquid Thin Liquid: Within functional limits Presentation: Straw    Nectar Thick Nectar Thick Liquid: Not tested   Honey Thick Honey Thick Liquid: Not tested   Puree  Puree: Within functional limits Presentation: Spoon   Solid     Solid: Within functional limits Presentation: Self Fed     Colleen [name] I. Vear Clock, MS, [name] Neuro Diagnostic Specialist  Acute Rehabilitation Services Office number: [phone]  Colleen [name] 03/20/2023,2:31 PM

## 2023-03-20 NOTE — TOC Initial Note (Signed)
Transition of Care [facility]) - Initial/Assessment Note    Patient Details  Name: Colleen [name] MRN: [id number] Date of Birth: 1951/05/18  Transition of Care [facility]) CM/SW Contact:    Kermit Balo, RN Phone Number: 03/20/2023, 3:47 PM  Clinical Narrative:                 Pt is from home with one of her daughters. Family in room and state they can also check on her at home.  Pt concerned about taking her medications at home correctly. CM will ask for [facility] and asked family to please set up pill box and oversee her medications at home.  Family says they will provide needed transportation. Home health recommended and pt asked to use the company she used in the past. Through [employer] found she used [facility]. Tresa Endo with [facility] accepted the referral. Information on the AVS.  PCP: [facility] for home ordered through [employer] and will be delivered to the room. TOC following.  Expected Discharge Plan: Home w Home Health Services Barriers to Discharge: Continued Medical Work up   Patient Goals and CMS Choice   CMS Medicare.gov Compare Post Acute Care list provided to:: Patient Choice offered to / list presented to : Patient      Expected Discharge Plan and Services   Discharge Planning Services: CM Consult Post Acute Care Choice: Home Health, Durable Medical Equipment Living arrangements for the past 2 months: Single Family Home                 DME Arranged: Walker rolling DME Agency: [employer] Date DME Agency Contacted: 03/20/23   Representative spoke with at DME Agency: [facility] HH Arranged: RN, PT, OT, Speech Therapy HH Agency: [facility] Date [facility] Agency Contacted: 03/20/23   Representative spoke with at [facility] Agency: Tresa Endo  Prior Living Arrangements/Services Living arrangements for the past 2 months: Single Family Home Lives with:: Adult Children Patient language and need for interpreter reviewed:: Yes Do you feel safe going back to the place  where you live?: Yes        Care giver support system in place?: Yes (comment) Current home services: DME (cane) Criminal Activity/Legal Involvement Pertinent to Current Situation/Hospitalization: No - Comment as needed  Activities of Daily Living Home Assistive Devices/Equipment: Eyeglasses ADL Screening (condition at time of admission) Patient's cognitive ability adequate to safely complete daily activities?: Yes Is the patient deaf or have difficulty hearing?: No Does the patient have difficulty seeing, even when wearing glasses/contacts?: No Does the patient have difficulty concentrating, remembering, or making decisions?: No Patient able to express need for assistance with ADLs?: Yes Does the patient have difficulty dressing or bathing?: No Independently performs ADLs?: Yes (appropriate for developmental age) Does the patient have difficulty walking or climbing stairs?: No Weakness of Legs: Left Weakness of Arms/Hands: Left  Permission Sought/Granted                  Emotional Assessment Appearance:: Appears stated age Attitude/Demeanor/Rapport: Engaged Affect (typically observed): Accepting Orientation: : Oriented to Self, Oriented to Place, Oriented to  Time, Oriented to Situation   Psych Involvement: No (comment)  Admission diagnosis:  Dysarthria [R47.1] Acute focal neurological deficit, onset within 3-24 hours [R29.818] Altered mental status, unspecified [R41.82] Patient Active Problem List   Diagnosis Date Noted   Altered mental status, unspecified 03/19/2023   Moderate persistent asthma 09/15/2022   Moderate obstructive sleep apnea 09/15/2022   Class 3 severe obesity due to excess  calories in adult [facility]) 09/15/2022   Hypertension    COPD (chronic obstructive pulmonary disease) (HCC)    Primary localized osteoarthritis of left knee 05/24/2019   GERD (gastroesophageal reflux disease)    Hypercholesterolemia    PCP:  Patient, No Pcp Per Pharmacy:   [id number] - [place] ([place]), [place] - [address] [place] [address] Phone: [phone] Fax: [phone]     Social Determinants of Health (SDOH) Social History: SDOH Screenings   Food Insecurity: No Food Insecurity (03/19/2023)  Housing: Low Risk  (03/19/2023)  Transportation Needs: No Transportation Needs (03/19/2023)  Utilities: Not At Risk (03/19/2023)  Tobacco Use: Low Risk  (03/19/2023)   SDOH Interventions:     Readmission Risk Interventions     No data to display

## 2023-03-20 NOTE — Progress Notes (Signed)
Speech Language Pathology Treatment:  (Fluency)  Patient Details Name: Colleen [name] MRN: [id number] DOB: 04-Oct-1951 Today's Date: 03/20/2023 Time: 1450-1505 SLP Time Calculation (min) (ACUTE ONLY): 15 min  Assessment / Plan / Recommendation Clinical Impression  Pt was seen for treatment with her family present. Pt and her family expressed concern regarding the pt's speech being quite far from baseline and the pt's high level of independence prior to admission. Pt's family was educated regarding strategies to improve communication with the pt, and to reduce the pt's frustration. They verbalized understanding as well as agreement and was noted to use some of the strategies during the session. Pt was educated regarding the nature of her dysfluencies and concerning fluency-shaping and stuttering modification techniques to increase communicative effectiveness. Pt verbalized understanding regarding use of pull-out technique, light articulatory contact, and easy onset. She was able to use these strategies with min-mod support from the support during conversation. Pt's fluency was improved by the end of the session compared to that noted during the bedside swallow eval. She was encouraged to communicate verbally as much as possible and to use these strategies as needed. All of the pt's and her family's questions were answered and they verbalized understanding and agreement with plan of care. SLP will continue to follow pt.     HPI HPI: Pt is a 72 y.o. who presented 6/9 with difficulty writing, difficulty getting words out, dizziness, and confusion. MRI brain negative for acute changes. EEG WNL. PMH: chronic hepatitis B, asthma, COPD, HTN.      SLP Plan  Continue with current plan of care  Patient needs continued Speech Lanaguage Pathology Services   Recommendations for follow up therapy are one component of a multi-disciplinary discharge planning process, led by the attending physician.   Recommendations may be updated based on patient status, additional functional criteria and insurance authorization.    Recommendations  Medication Administration: Whole meds with liquid                  Oral care BID   Intermittent Supervision/Assistance  (Fluency disorder with linguistic impairments)     Continue with current plan of care    Colleen [name] I. Vear Clock, MS, [name] Neuro Diagnostic Specialist  Acute Rehabilitation Services Office number: [phone]  Colleen [name]  03/20/2023, 3:36 PM

## 2023-03-20 NOTE — Progress Notes (Signed)
[facility] PROGRESS NOTE   [name] ZOX:[id number] DOB: February 04, 1951 DOA: 03/19/2023  PCP: Patient, No Pcp Per  Brief History/Interval Summary: 72 y.o. female with medical history significant for obesity, hypertension, chronic hepatitis B on maintenance Entecavir, cirrhosis of the liver, allergic asthma, OSA, peripheral neuropathy, ambulatory dysfunction (uses a walker at baseline), who initially presented to [facility] ED due to altered mental status, sudden onset difficulty with writing, aphasia, and dizziness.  Patient was hospitalized due to concern for TIA versus stroke.  Consultants: Neurology  Procedures: Echocardiogram is pending    Subjective/Interval History: Patient having a lot of stuttering while talking this morning.  Denies any chest pain shortness of breath.  No nausea or vomiting.  Denies any headaches.     Assessment/Plan:  Strokelike symptoms Patient with significant dysarthria.  No focal motor deficits noted this morning. CT head did not show any acute findings.  CT angiogram was also done.  Defer results to neurology. MRI brain is pending. Echocardiogram is pending. LDL is 111.  Noted to be on atorvastatin. HbA1c is 5.6. PT OT SLP evaluation. Will also do EEG. Currently on aspirin and Plavix.  Essential hypertension Allowing permissive hypertension.  History of hepatitis B Continue with Entecavir.  History of asthma Stable.  Continue home medications.  Polyneuropathy Continue with home medications.  Obesity Estimated body mass index is 41.17 kg/m as calculated from the following:   Height as of 09/15/22: 5\' 3"  (1.6 m).   Weight as of 09/15/22: 105.4 kg.   DVT Prophylaxis: Lovenox Code Status: Full code Family Communication: Discussed with patient Disposition Plan: To be determined  Status is: Observation The patient will require care spanning > 2 midnights and should be moved to inpatient because: Strokelike  symptoms      Medications: Scheduled:  aspirin EC  81 mg Oral Daily   atorvastatin  40 mg Oral Daily   clopidogrel  75 mg Oral Daily   clotrimazole   Topical BID   enoxaparin (LOVENOX) injection  40 mg Subcutaneous Q24H   entecavir  0.5 mg Oral Daily   fluticasone  2 spray Each Nare Daily   gabapentin  300 mg Oral BID   mometasone-formoterol  2 puff Inhalation BID   montelukast  10 mg Oral QHS   multivitamin with minerals  1 tablet Oral Daily   pantoprazole  40 mg Oral Daily   Continuous:  sodium chloride 50 mL/hr at 03/20/23 0354   WJX:BJYNWGNFAOZHY, albuterol, labetalol, melatonin, ondansetron (ZOFRAN) IV, polyethylene glycol  Antibiotics: Anti-infectives (From admission, onward)    Start     Dose/Rate Route Frequency Ordered Stop   03/20/23 1000  entecavir (BARACLUDE) tablet 0.5 mg        0.5 mg Oral Daily 03/19/23 2354         Objective:  Vital Signs  Vitals:   03/19/23 2158 03/19/23 2317 03/20/23 0315 03/20/23 0855  BP: (!) 153/95 124/75 94/67 (!) 171/88  Pulse: 83 74 65 79  Resp: 18 18 18 20   Temp: 98.4 F (36.9 C) 98.3 F (36.8 C) 98 F (36.7 C) 98.8 F (37.1 C)  TempSrc: Oral Oral Oral Oral  SpO2: 94% 95% 96% 99%    Intake/Output Summary (Last 24 hours) at 03/20/2023 0959 Last data filed at 03/20/2023 0600 Gross per 24 hour  Intake 294.64 ml  Output 800 ml  Net -505.36 ml   There were no vitals filed for this visit.  General appearance: Awake alert.  In no distress Resp: Clear  to auscultation bilaterally.  Normal effort Cardio: S1-S2 is normal regular.  No S3-S4.  No rubs murmurs or bruit GI: Abdomen is soft.  Nontender nondistended.  Bowel sounds are present normal.  No masses organomegaly Extremities: No edema.  Full range of motion of lower extremities. Neurologic: Alert and oriented x3.  No focal neurological deficits.    Lab Results:  Data Reviewed: I have personally reviewed following labs and reports of the imaging  studies  CBC: Recent Labs  Lab 03/19/23 1106 03/20/23 0732  WBC 7.5 6.7  NEUTROABS 3.9  --   HGB 13.2 12.7  HCT 39.8 39.1  MCV 90.2 92.4  PLT 279 243    Basic Metabolic Panel: Recent Labs  Lab 03/19/23 1106 03/20/23 0732  NA 138 135  K 3.9 3.9  CL 101 102  CO2 27 21*  GLUCOSE 87 98  BUN 9 10  CREATININE 0.62 0.74  CALCIUM 9.6 9.4  MG  --  2.1  PHOS  --  4.2    GFR: CrCl cannot be calculated (Unknown ideal weight.).  Liver Function Tests: Recent Labs  Lab 03/19/23 1106  AST 19  ALT 8  ALKPHOS 81  BILITOT 0.3  PROT 9.1*  ALBUMIN 4.2     Coagulation Profile: Recent Labs  Lab 03/19/23 1106  INR 1.0    HbA1C: Recent Labs    03/20/23 0732  HGBA1C 5.6    CBG: Recent Labs  Lab 03/19/23 1058 03/19/23 1435  GLUCAP 93 79    Lipid Profile: Recent Labs    03/20/23 0732  CHOL 162  HDL 36*  LDLCALC 111*  TRIG 73  CHOLHDL 4.5     Radiology Studies: CT ANGIO HEAD NECK W WO CM  Result Date: 03/20/2023 CLINICAL DATA:  Neuro deficit with stroke suspected EXAM: CT ANGIOGRAPHY HEAD AND NECK WITH AND WITHOUT CONTRAST TECHNIQUE: Multidetector CT imaging of the head and neck was performed using the standard protocol during bolus administration of intravenous contrast. Multiplanar CT image reconstructions and MIPs were obtained to evaluate the vascular anatomy. Carotid stenosis measurements (when applicable) are obtained utilizing NASCET criteria, using the distal internal carotid diameter as the denominator. RADIATION DOSE REDUCTION: This exam was performed according to the departmental dose-optimization program which includes automated exposure control, adjustment of the mA and/or kV according to patient size and/or use of iterative reconstruction technique. CONTRAST:  75mL OMNIPAQUE IOHEXOL 350 MG/ML SOLN COMPARISON:  Head CT from yesterday FINDINGS: CTA NECK FINDINGS Aortic arch: Atheromatous calcification with 3 vessel branching Right carotid system:  Partial retropharyngeal course. No significant atheromatous changes. Left carotid system: Partial retropharyngeal course. No significant atheromatous changes. No stenosis or beading. Vertebral arteries: No proximal subclavian or vertebral stenosis. Symmetric vertebral tortuosity without beading or dissection. Skeleton: Cervical spine degeneration and fusion. No acute or aggressive finding. Other neck: No acute finding Upper chest: Clear apical lungs. Review of the MIP images confirms the above findings CTA HEAD FINDINGS Anterior circulation: No significant stenosis, proximal occlusion, aneurysm, or vascular malformation. Posterior circulation: No significant stenosis, proximal occlusion, aneurysm, or vascular malformation. Venous sinuses: Patent when accounting for arachnoid granulation at the left transverse sigmoid junction. Anterior diverticulum at the right transverse sigmoid junction. Anatomic variants: Hypoplastic P1 segments. Review of the MIP images confirms the above findings IMPRESSION: No emergent finding. Less than typical atheromatous changes for age with no significant stenosis of major arteries in the head and neck. Electronically Signed   By: Tiburcio Pea M.D.   On: 03/20/2023 05:55  CT HEAD WO CONTRAST  Result Date: 03/19/2023 CLINICAL DATA:  Neuro deficit, acute, stroke suspected. EXAM: CT HEAD WITHOUT CONTRAST TECHNIQUE: Contiguous axial images were obtained from the base of the skull through the vertex without intravenous contrast. RADIATION DOSE REDUCTION: This exam was performed according to the departmental dose-optimization program which includes automated exposure control, adjustment of the mA and/or kV according to patient size and/or use of iterative reconstruction technique. COMPARISON:  Head CT 05/27/2004. FINDINGS: Brain: No acute hemorrhage. Patchy hypoattenuation of the cerebral white matter, most consistent with moderate chronic small-vessel disease. No hydrocephalus or  extra-axial collection. No mass effect or midline shift. Vascular: No hyperdense vessel or unexpected calcification. Skull: No calvarial fracture or suspicious bone lesion. Skull base is unremarkable. Sinuses/Orbits: Unremarkable. Other: None. IMPRESSION: 1. No acute intracranial abnormality. 2. Moderate chronic small-vessel disease. Electronically Signed   By: Orvan Falconer M.D.   On: 03/19/2023 11:48       LOS: 0 days   Paulena Servais Rito Ehrlich  [facility] Pager on [email or web address]  03/20/2023, 9:59 AM

## 2023-03-20 NOTE — Progress Notes (Signed)
EEG complete - results pending 

## 2023-03-20 NOTE — Plan of Care (Signed)
  Problem: Education: Goal: Knowledge of General Education information will improve Description: Including pain rating scale, medication(s)/side effects and non-pharmacologic comfort measures Outcome: Progressing   Problem: Health Behavior/Discharge Planning: Goal: Ability to manage health-related needs will improve Outcome: Progressing   Problem: Clinical Measurements: Goal: Ability to maintain clinical measurements within normal limits will improve Outcome: Progressing   Problem: Education: Goal: Knowledge of disease or condition will improve Outcome: Progressing Goal: Knowledge of secondary prevention will improve (MUST DOCUMENT ALL) Outcome: Progressing Goal: Knowledge of patient specific risk factors will improve (Mark N/A or DELETE if not current risk factor) Outcome: Progressing   Problem: Ischemic Stroke/TIA Tissue Perfusion: Goal: Complications of ischemic stroke/TIA will be minimized Outcome: Progressing   Problem: Coping: Goal: Will verbalize positive feelings about self Outcome: Progressing Goal: Will identify appropriate support needs Outcome: Progressing   

## 2023-03-20 NOTE — Consult Note (Signed)
Neurology Consultation Reason for Consult: Altered mental status Referring Physician: Margo Aye, C  CC: Altered mental status  History is obtained from: Patient  HPI: Colleen [name] is a 72 y.o. female who presents with altered mental status, unsteady gait, and left-sided numbness that started this morning.  She states that she gets up normally around 4 AM, sometimes shortly after that she was trying to write her name on an envelope, and found that she was having difficulty.  She then noticed that she was unsteady, more so than typical.  She uses a walker at baseline, but was more unsteady.  She thought she might feel better if she went to church, but while driving there, passed to the church and then turned around and coming back past the church again.  She stated that she felt completely lost.  Due to the symptoms she sought care in the emergency department.  She feels like she is stuttering, though I do not appreciate this while I am speaking with her.   LKW: 6/8 tnk given?: no, outside of window Past Medical History:  Diagnosis Date   Acid reflux    Arthritis    Asthma    Chronic hepatitis B (HCC)    COPD (chronic obstructive pulmonary disease) (HCC)    Mild   GERD (gastroesophageal reflux disease)    History of iron deficiency anemia    with pregnancy   Hypercholesterolemia    Hypertension    Numbness and tingling of both upper extremities      Family History  Problem Relation Age of Onset   Cancer Mother    Cancer Father      Social History:  reports that she has never smoked. She has never used smokeless tobacco. She reports that she does not drink alcohol and does not use drugs.   Exam: Current vital signs: BP 94/67 (BP Location: Left Arm)   Pulse 65   Temp 98 F (36.7 C) (Oral)   Resp 18   SpO2 96%  Vital signs in last 24 hours: Temp:  [97.9 F (36.6 C)-99.2 F (37.3 C)] 98 F (36.7 C) (06/10 0315) Pulse Rate:  [65-97] 65 (06/10 0315) Resp:  [14-18] 18  (06/10 0315) BP: (94-192)/(67-107) 94/67 (06/10 0315) SpO2:  [94 %-100 %] 96 % (06/10 0315)   Physical Exam  Appears well-developed and well-nourished.   Neuro: Mental Status: Patient is awake, alert, oriented to person, place, month, year, and situation. Patient is able to give a clear and coherent history. No signs of aphasia or neglect Cranial Nerves: II: Visual Fields are full. Pupils are equal, round, and reactive to light.   III,IV, VI: EOMI without ptosis or diploplia.  V: Facial sensation is diminished on the left VII: Facial movement is symmetric.  VIII: hearing is intact to voice X: Uvula elevates symmetrically XI: Shoulder shrug is symmetric. XII: tongue is midline without atrophy or fasciculations.  Motor: Tone is normal. Bulk is normal. 5/5 strength was present in all four extremities.  Sensory: Sensation is diminished on the left, she does not extinguish Cerebellar: FNF and HKS are intact bilaterally      I have reviewed labs in epic and the results pertinent to this consultation are: CMP is unremarkable  I have reviewed the images obtained: CT head unremarkable  Impression: 72 year old female with a history of 1 day of confusion, worsened unsteady gait, and left-sided hypoesthesia.  Given the unilateral symptoms, I do think that stroke is a strong consideration, treated as such  at this time.  Recommendations: - HgbA1c, fasting lipid panel - MRI of the brain without contrast - Frequent neuro checks - Echocardiogram - CTA head and neck - Prophylactic therapy-Antiplatelet med: Aspirin - dose 81mg  and plavix 75mg  daily   - Risk factor modification - Telemetry monitoring - PT consult, OT consult, Speech consult - Stroke team to follow    Ritta Slot, MD [facility] [phone]  If 7pm- 7am, please page neurology on call as listed in AMION.

## 2023-03-20 NOTE — Care Management Obs Status (Signed)
MEDICARE OBSERVATION STATUS NOTIFICATION   Patient Details  Name: Colleen [name] MRN: [id number] Date of Birth: 11-19-1950   Medicare Observation Status Notification Given:  Yes    Kermit Balo, RN 03/20/2023, 1:59 PM

## 2023-03-20 NOTE — Progress Notes (Signed)
  Echocardiogram 2D Echocardiogram has been performed.  Colleen [name] 03/20/2023, 4:11 PM

## 2023-03-20 NOTE — Progress Notes (Signed)
Patient back to unit.

## 2023-03-20 NOTE — Procedures (Addendum)
Patient Name: Colleen [name]  MRN: [id number]  Epilepsy Attending: Charlsie Quest  Referring Physician/Provider: Marvel Plan, MD  Date: 03/20/2023 Duration: 24.50 mins  Patient history: 72 y.o. female who presents with altered mental status, unsteady gait, and left-sided numbness that started this morning. EEG to evaluate for seizure.  Level of alertness: Awake, asleep  AEDs during EEG study: Ativan  Technical aspects: This EEG study was done with scalp electrodes positioned according to the 10-20 International system of electrode placement. Electrical activity was reviewed with band pass filter of 1-70Hz , sensitivity of 7 uV/mm, display speed of 22mm/sec with a 60Hz  notched filter applied as appropriate. EEG data were recorded continuously and digitally stored.  Video monitoring was available and reviewed as appropriate.  Description: The posterior dominant rhythm consists of 8-9 Hz activity of moderate voltage (25-35 uV) seen predominantly in posterior head regions, symmetric and reactive to eye opening and eye closing. Sleep was characterized by vertex waves, sleep spindles (12 to 14 Hz), maximal frontocentral region.  There is an excessive amount of 15 to 18 Hz beta activity distributed symmetrically and diffusely. Physiologic photic driving was not seen during photic stimulation.  Hyperventilation was not performed.     ABNORMALITY - Excessive beta, generalized  IMPRESSION: This study is within normal limits. The excessive beta activity seen in the background is most likely due to the effect of benzodiazepine and is a benign EEG pattern. No seizures or epileptiform discharges were seen throughout the recording.  A normal interictal EEG does not exclude the diagnosis of epilepsy.  Brighid Koch Annabelle Harman

## 2023-03-20 NOTE — Progress Notes (Signed)
Patient off the unit for MRI.

## 2023-03-20 NOTE — Evaluation (Signed)
Speech Language Pathology Evaluation Patient Details Name: Colleen [name] MRN: [id number] DOB: 25-Dec-1950 Today's Date: 03/20/2023 Time: 4098-1191 SLP Time Calculation (min) (ACUTE ONLY): 23 min  Problem List:  Patient Active Problem List   Diagnosis Date Noted   Altered mental status, unspecified 03/19/2023   Moderate persistent asthma 09/15/2022   Moderate obstructive sleep apnea 09/15/2022   Class 3 severe obesity due to excess calories in adult [facility]) 09/15/2022   Hypertension    COPD (chronic obstructive pulmonary disease) (HCC)    Primary localized osteoarthritis of left knee 05/24/2019   GERD (gastroesophageal reflux disease)    Hypercholesterolemia    Past Medical History:  Past Medical History:  Diagnosis Date   Acid reflux    Arthritis    Asthma    Chronic hepatitis B (HCC)    COPD (chronic obstructive pulmonary disease) (HCC)    Mild   GERD (gastroesophageal reflux disease)    History of iron deficiency anemia    with pregnancy   Hypercholesterolemia    Hypertension    Numbness and tingling of both upper extremities    Past Surgical History:  Past Surgical History:  Procedure Laterality Date   BACK SURGERY     BILATERAL CARPAL TUNNEL RELEASE     CERVICAL SPINE SURGERY     KNEE SURGERY Left    x2   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 who presented 6/9 with difficulty writing, difficulty getting words out, dizziness, and confusion. MRI brain negative for acute changes. EEG WNL. PMH: chronic hepatitis B, asthma, COPD, HTN.   Assessment / Plan / Recommendation Clinical Impression  Pt participated in speech-language-cognition evaluation with her daughters and granddaughter present. Pt's family reported that the pt's communication is far from baseline and described the pt's speech is "slow" with  "stuttering". Pt stated that she has difficulty with word retrieval/"getting the words out". Pt's receptive language was grossly WFL, but with some difficulty with auditory comprehension of larger portion of information or with more complex material. She demonstrated adequate confrontational naming for high-frequency words, but exhibited increased difficulty with lower frequency words and during responsive naming tasks. Episodes of dysfluency were noted throughout the evaluation characterized by part-word repetitions, phoneme repetition, and prolongations. [facility] Mental Status Examination was completed to evaluate the pt's cognitive-linguistic skills. Pt's performance on this was likely negatively impacted by her linguistic and speech impairments, but she achieved a score of 10/30 which is below the normal limits of 27 or more out of 30. She exhibited difficulty with memory and complex problem solving. The etiology of pt's symptoms is unclear at this time, but pt's family reported that all of these symptoms are acute. Skilled SLP services will be continued.    SLP Assessment  SLP Recommendation/Assessment: Patient needs continued Speech Lanaguage Pathology Services SLP Visit Diagnosis:  (Fluency disorder with linguistic impairments)    Recommendations for follow up therapy are one component of a multi-disciplinary discharge planning process, led by the attending physician.  Recommendations may be updated based on patient status, additional functional criteria and insurance authorization.    Follow Up Recommendations  Home health SLP    Assistance Recommended at Discharge  Intermittent Supervision/Assistance  Functional Status Assessment Patient has had a recent decline in their functional status and demonstrates the ability to make significant improvements in function  in a reasonable and predictable amount of time.  Frequency and Duration min 2x/week  2 weeks      SLP  Evaluation Cognition  Overall Cognitive Status: Difficult to assess (due to speech impairment) Arousal/Alertness: Awake/alert Orientation Level: Oriented X4 Year: 2024 Month: June Day of Week: Correct Attention: Focused;Sustained Focused Attention: Appears intact Sustained Attention: Appears intact Memory: Impaired Memory Impairment:  (Immediate: 5/5; delayed: 0/5 with cues: 0/5) Awareness: Appears intact Problem Solving: Impaired Problem Solving Impairment: Verbal complex (money: 0/3)       Comprehension  Auditory Comprehension Overall Auditory Comprehension: Impaired Yes/No Questions: Impaired Basic Immediate Environment Questions:  (5/5) Complex Questions:  (2/5) Paragraph Comprehension (via yes/no questions):  (oopen ended: 4/8) One Step Basic Commands:  (5/5) Two Step Basic Commands:  (4/4) Multistep Basic Commands:  (1/2)    Expression Expression Primary Mode of Expression: Verbal Verbal Expression Overall Verbal Expression: Impaired Initiation: Impaired Automatic Speech: Counting;Day of week;Month of year Level of Generative/Spontaneous Verbalization: Sentence Repetition: Impaired Level of Impairment: Sentence level (4/5) Responsive:  (4/5) Confrontation:  (10/10) Convergent:  (Sentence completion: 5/5)   Oral / Motor  Oral Motor/Sensory Function Overall Oral Motor/Sensory Function: Within functional limits Motor Speech Overall Motor Speech: Appears within functional limits for tasks assessed Respiration: Within functional limits Phonation: Normal Articulation: Impaired Level of Impairment: Sentence Motor Planning: Impaired Level of Impairment: Phrase Motor Speech Errors: Aware           Nevea Spiewak I. Vear Clock, MS, [name] Neuro Diagnostic Specialist  Acute Rehabilitation Services Office number: [phone]  [name] 03/20/2023, 3:25 PM

## 2023-03-20 NOTE — Plan of Care (Signed)
  Problem: Education: Goal: Knowledge of General Education information will improve Description: Including pain rating scale, medication(s)/side effects and non-pharmacologic comfort measures Outcome: Progressing   Problem: Clinical Measurements: Goal: Respiratory complications will improve Outcome: Progressing Goal: Cardiovascular complication will be avoided Outcome: Progressing   Problem: Activity: Goal: Risk for activity intolerance will decrease Outcome: Progressing   Problem: Nutrition: Goal: Adequate nutrition will be maintained Outcome: Progressing   Problem: Coping: Goal: Level of anxiety will decrease Outcome: Progressing   Problem: Elimination: Goal: Will not experience complications related to bowel motility Outcome: Progressing Goal: Will not experience complications related to urinary retention Outcome: Progressing   Problem: Skin Integrity: Goal: Risk for impaired skin integrity will decrease Outcome: Progressing   Problem: Education: Goal: Knowledge of disease or condition will improve Outcome: Progressing   Problem: Coping: Goal: Will verbalize positive feelings about self Outcome: Progressing Goal: Will identify appropriate support needs Outcome: Progressing   Problem: Self-Care: Goal: Ability to participate in self-care as condition permits will improve Outcome: Progressing

## 2023-03-20 NOTE — Evaluation (Signed)
Physical Therapy Evaluation Patient Details Name: Colleen [name] MRN: [id number] DOB: 26-May-1951 Today's Date: 03/20/2023  History of Present Illness  Pt is a 72 y.o. F who presents 03/19/2023 with altered mental status, unsteady gait, left sided numbness. CT head negative for acute abnormality. MRI pending. Significant PMH: chronic hepatitis B, asthma, COPD, HTN.   Clinical Impression  PTA pt lives with their daughter at home and reports independence with functional mobility and ADL's, using a RW PRN. Today, pt with increased time for transfers, succeeds after 2x attempts with no AD. Pt is unable to progress steps forward with HHA so RW was provided. Pt amb with min G for 200' with narrow base of support, decreased stride length, reduced gait speed, and 1x event of L knee buckling. Light touch sensation assessed for diminished sensation throughout L LE. Pt will continue to benefit from skilled therapy to facilitate improvements in the above impairments, amenable to HHPT recommendation.       Recommendations for follow up therapy are one component of a multi-disciplinary discharge planning process, led by the attending physician.  Recommendations may be updated based on patient status, additional functional criteria and insurance authorization.  Follow Up Recommendations       Assistance Recommended at Discharge Intermittent Supervision/Assistance  Patient can return home with the following  A little help with walking and/or transfers;A little help with bathing/dressing/bathroom;Assistance with cooking/housework;Assist for transportation;Help with stairs or ramp for entrance    Equipment Recommendations None recommended by PT  Recommendations for Other Services       Functional Status Assessment Patient has had a recent decline in their functional status and demonstrates the ability to make significant improvements in function in a reasonable and predictable amount of time.      Precautions / Restrictions Precautions Precautions: Fall Restrictions Weight Bearing Restrictions: No      Mobility  Bed Mobility Overal bed mobility: Independent             General bed mobility comments: With increased time    Transfers Overall transfer level: Needs assistance Equipment used: None, Rolling walker (2 wheels) Transfers: Sit to/from Stand Sit to Stand: Supervision           General transfer comment: No AD used at first, with increased time and several attempts before success. With RW, decreased time and adequate push-off/eccentric lowering.    Ambulation/Gait Ambulation/Gait assistance: Min guard   Assistive device: Rolling walker (2 wheels) Gait Pattern/deviations: Narrow base of support, Knees buckling, Step-through pattern, Decreased stride length, Decreased dorsiflexion - left   Gait velocity interpretation: <1.31 ft/sec, indicative of household ambulator   General Gait Details: Reduced gait speed, able to correct narrow base of support with verbal cueing but only maintains for a few steps. Pt prefers to gaze towards their feet during amb. Navigates bilateral turning with increased time.  Stairs            Wheelchair Mobility    Modified Rankin (Stroke Patients Only)       Balance Overall balance assessment: Needs assistance Sitting-balance support: Feet supported, No upper extremity supported Sitting balance-Leahy Scale: Good     Standing balance support: No upper extremity supported, Bilateral upper extremity supported, During functional activity Standing balance-Leahy Scale: Fair Standing balance comment: Attempted no AD at first but pt demos inability to shift weight. With RW pt can shift weight for dynamic balance.  Pertinent Vitals/Pain Pain Assessment Pain Assessment: No/denies pain    Home Living Family/patient expects to be discharged to:: Private residence Living  Arrangements: Children Available Help at Discharge: Family;Available PRN/intermittently Type of Home: House Home Access: Stairs to enter Entrance Stairs-Rails: Right Entrance Stairs-Number of Steps: 3   Home Layout: One level Home Equipment: Agricultural consultant (2 wheels)      Prior Function Prior Level of Function : Independent/Modified Independent             Mobility Comments: Claims to use RW PRN, PTA. ADLs Comments: Reports independence with ADL's, PTA.     Hand Dominance        Extremity/Trunk Assessment   Upper Extremity Assessment Upper Extremity Assessment: Defer to OT evaluation    Lower Extremity Assessment Lower Extremity Assessment: RLE deficits/detail RLE Sensation: WNL LLE Sensation: decreased light touch;history of peripheral neuropathy    Cervical / Trunk Assessment Cervical / Trunk Assessment: Kyphotic  Communication   Communication: Expressive difficulties  Cognition Arousal/Alertness: Awake/alert Behavior During Therapy: WFL for tasks assessed/performed Overall Cognitive Status: Within Functional Limits for tasks assessed                                 General Comments: Able to follow one step and multi-step commands frequently.        General Comments      Exercises     Assessment/Plan    PT Assessment Patient needs continued PT services  PT Problem List Decreased balance;Decreased mobility;Impaired sensation       PT Treatment Interventions Gait training;Stair training;Functional mobility training;Balance training    PT Goals (Current goals can be found in the Care Plan section)  Acute Rehab PT Goals Patient Stated Goal: eager to return home PT Goal Formulation: With patient/family Time For Goal Achievement: 04/03/23 Potential to Achieve Goals: Good    Frequency Min 4X/week     Co-evaluation               AM-PAC PT "6 Clicks" Mobility  Outcome Measure Help needed turning from your back to your side  while in a flat bed without using bedrails?: None Help needed moving from lying on your back to sitting on the side of a flat bed without using bedrails?: None Help needed moving to and from a bed to a chair (including a wheelchair)?: A Little Help needed standing up from a chair using your arms (e.g., wheelchair or bedside chair)?: A Little Help needed to walk in hospital room?: A Little Help needed climbing 3-5 steps with a railing? : A Lot 6 Click Score: 19    End of Session Equipment Utilized During Treatment: Gait belt Activity Tolerance: Patient tolerated treatment well Patient left: in chair;with call bell/phone within reach;with chair alarm set   PT Visit Diagnosis: Difficulty in walking, not elsewhere classified (R26.2);Other symptoms and signs involving the nervous system (R29.898);Other abnormalities of gait and mobility (R26.89)    Time: 1610-9604 PT Time Calculation (min) (ACUTE ONLY): 30 min   Charges:   PT Evaluation $PT Eval Low Complexity: 1 Low PT Treatments $Gait Training: 8-22 mins        Hendricks Milo, SPT  Acute Rehabilitation Services   Hendricks Milo 03/20/2023, 1:41 PM

## 2023-03-21 DIAGNOSIS — R471 Dysarthria and anarthria: Secondary | ICD-10-CM | POA: Diagnosis not present

## 2023-03-21 MED ORDER — ATORVASTATIN CALCIUM 40 MG PO TABS: 40.0000 mg | ORAL_TABLET | Freq: Every day | ORAL | 2 refills | Status: AC

## 2023-03-21 MED ORDER — MELATONIN 5 MG PO TABS: 5.0000 mg | ORAL_TABLET | Freq: Every evening | ORAL | 0 refills | Status: AC | PRN

## 2023-03-21 NOTE — TOC Transition Note (Signed)
Transition of Care [facility]) - CM/SW Discharge Note   Patient Details  Name: Colleen [name] MRN: [id number] Date of Birth: May 28, 1951  [facility] CM/SW Contact:  Kermit Balo, RN Phone Number: 03/21/2023, 9:52 AM   Clinical Narrative:    Pt is discharging home with home health services through [facility]. Information on the AVS.  Pt has transportation home.   Final next level of care: Home w Home Health Services Barriers to Discharge: No Barriers Identified   Patient Goals and CMS Choice CMS Medicare.gov Compare Post Acute Care list provided to:: Patient Choice offered to / list presented to : Patient  Discharge Placement                         Discharge Plan and Services Additional resources added to the After Visit Summary for     Discharge Planning Services: CM Consult Post Acute Care Choice: Home Health, Durable Medical Equipment          DME Arranged: Walker rolling DME Agency: [employer] Date DME Agency Contacted: 03/20/23   Representative spoke with at DME Agency: Marthann Schiller HH Arranged: RN, PT, OT, Speech Therapy HH Agency: [facility] Date [facility] Agency Contacted: 03/20/23   Representative spoke with at [facility] Agency: Tresa Endo  Social Determinants of Health (SDOH) Interventions SDOH Screenings   Food Insecurity: No Food Insecurity (03/19/2023)  Housing: Low Risk  (03/19/2023)  Transportation Needs: No Transportation Needs (03/19/2023)  Utilities: Not At Risk (03/19/2023)  Tobacco Use: Low Risk  (03/19/2023)     Readmission Risk Interventions     No data to display

## 2023-03-21 NOTE — Discharge Summary (Signed)
[facility]  Physician Discharge Summary   Patient ID: Colleen [name] MRN: [id number] DOB/AGE: June 30, 1951 72 y.o.  Admit date: 03/19/2023 Discharge date: 03/21/2023    PCP: Patient, No Pcp Per  DISCHARGE DIAGNOSES:  Speech impairment thought to be due to severe acute anxiety Essential hypertension History of hepatitis B History of asthma Polyneuropathy   RECOMMENDATIONS FOR OUTPATIENT FOLLOW UP: Follow-up with Dr. Allena Katz with [facility]   Home Health: PT  Equipment/Devices: Dan Humphreys  CODE STATUS: Full code  DISCHARGE CONDITION: fair  Diet recommendation: As before  INITIAL HISTORY: 72 y.o. female with medical history significant for obesity, hypertension, chronic hepatitis B on maintenance Entecavir, cirrhosis of the liver, allergic asthma, OSA, peripheral neuropathy, ambulatory dysfunction (uses a walker at baseline), who initially presented to [facility] ED due to altered mental status, sudden onset difficulty with writing, aphasia, and dizziness.  Patient was hospitalized due to concern for TIA versus stroke.   Consultants: Neurology   Procedures: Echocardiogram   HOSPITAL COURSE:   Speech impairment thought to be secondary to severe acute anxiety Patient with significant dysarthria.  No focal motor deficits noted. CT head did not show any acute findings.  CT angiogram was also done.  Defer results to neurology. Right brain did not show any stroke.  Echocardiogram shows normal systolic function. LDL is 111.  Atorvastatin dose increased. HbA1c is 5.6. Patient will be discharged on aspirin per neurology.  They feel that her symptoms were due to anxiety rather than vascular event.   Essential hypertension   History of hepatitis B Continue with Entecavir.   History of asthma Stable.  Continue home medications.   Polyneuropathy Continue with home medications.   Obesity Estimated body mass index is 41.17 kg/m as calculated from the  following:   Height as of 09/15/22: 5\' 3"  (1.6 m).   Weight as of 09/15/22: 105.4 kg.  Patient stable.  Okay for discharge home today.   PERTINENT LABS:  The results of significant diagnostics from this hospitalization (including imaging, microbiology, ancillary and laboratory) are listed below for reference.      Labs:   Basic Metabolic Panel: Recent Labs  Lab 03/19/23 1106 03/20/23 0732  NA 138 135  K 3.9 3.9  CL 101 102  CO2 27 21*  GLUCOSE 87 98  BUN 9 10  CREATININE 0.62 0.74  CALCIUM 9.6 9.4  MG  --  2.1  PHOS  --  4.2   Liver Function Tests: Recent Labs  Lab 03/19/23 1106  AST 19  ALT 8  ALKPHOS 81  BILITOT 0.3  PROT 9.1*  ALBUMIN 4.2    CBC: Recent Labs  Lab 03/19/23 1106 03/20/23 0732  WBC 7.5 6.7  NEUTROABS 3.9  --   HGB 13.2 12.7  HCT 39.8 39.1  MCV 90.2 92.4  PLT 279 243     CBG: Recent Labs  Lab 03/19/23 1058 03/19/23 1435  GLUCAP 93 79     IMAGING STUDIES ECHOCARDIOGRAM COMPLETE  Result Date: 03/20/2023    ECHOCARDIOGRAM REPORT   Patient Name:   Colleen [name] Date of Exam: 03/20/2023 Medical Rec #:  [id number]         Height:       63.0 in Accession #:    [id number]        Weight:       232.4 lb Date of Birth:  02-12-1951        BSA:          2.061 m Patient  Age:    71 years          BP:           120/92 mmHg Patient Gender: F                 HR:           85 bpm. Exam Location:  Inpatient Procedure: 2D Echo, Cardiac Doppler and Color Doppler Indications:    TIA  History:        Patient has no prior history of Echocardiogram examinations.                 COPD, Signs/Symptoms:Altered Mental Status; Risk                 Factors:Hypertension and Dyslipidemia.  Sonographer:    Delcie Roch RDCS Referring Phys: [id number] Oliver Pila HALL  Sonographer Comments: Image acquisition challenging due to patient body habitus. IMPRESSIONS  1. Left ventricular ejection fraction, by estimation, is 60 to 65%. The left ventricle has normal function.  The left ventricle has no regional wall motion abnormalities. Left ventricular diastolic parameters are consistent with Grade I diastolic dysfunction (impaired relaxation).  2. Right ventricular systolic function is normal. The right ventricular size is normal.  3. The mitral valve is grossly normal. Trivial mitral valve regurgitation. No evidence of mitral stenosis.  4. The aortic valve was not well visualized. Aortic valve regurgitation is not visualized. No aortic stenosis is present. FINDINGS  Left Ventricle: Left ventricular ejection fraction, by estimation, is 60 to 65%. The left ventricle has normal function. The left ventricle has no regional wall motion abnormalities. The left ventricular internal cavity size was normal in size. There is  no left ventricular hypertrophy. Left ventricular diastolic parameters are consistent with Grade I diastolic dysfunction (impaired relaxation). Right Ventricle: The right ventricular size is normal. No increase in right ventricular wall thickness. Right ventricular systolic function is normal. Left Atrium: Left atrial size was normal in size. Right Atrium: Right atrial size was normal in size. Pericardium: There is no evidence of pericardial effusion. Mitral Valve: The mitral valve is grossly normal. Trivial mitral valve regurgitation. No evidence of mitral valve stenosis. Tricuspid Valve: The tricuspid valve is normal in structure. Tricuspid valve regurgitation is trivial. Aortic Valve: The aortic valve was not well visualized. Aortic valve regurgitation is not visualized. No aortic stenosis is present. Pulmonic Valve: The pulmonic valve was normal in structure. Pulmonic valve regurgitation is not visualized. Aorta: The aortic root is normal in size and structure. IAS/Shunts: The atrial septum is grossly normal.  LEFT VENTRICLE PLAX 2D LVIDd:         3.30 cm   Diastology LVIDs:         2.20 cm   LV e' medial:    5.98 cm/s LV PW:         1.10 cm   LV E/e' medial:  10.9 LV  IVS:        1.20 cm   LV e' lateral:   8.27 cm/s LVOT diam:     2.10 cm   LV E/e' lateral: 7.8 LV SV:         56 LV SV Index:   27 LVOT Area:     3.46 cm  RIGHT VENTRICLE             IVC RV S prime:     15.30 cm/s  IVC diam: 2.20 cm TAPSE (M-mode): 2.5 cm LEFT ATRIUM  Index        RIGHT ATRIUM           Index LA Vol (A2C):   47.9 ml 23.24 ml/m  RA Area:     12.90 cm LA Vol (A4C):   39.6 ml 19.21 ml/m  RA Volume:   27.50 ml  13.34 ml/m LA Biplane Vol: 44.0 ml 21.35 ml/m  AORTIC VALVE LVOT Vmax:   93.40 cm/s LVOT Vmean:  61.600 cm/s LVOT VTI:    0.161 m  AORTA Ao Root diam: 3.20 cm MITRAL VALVE MV Area (PHT): 3.85 cm    SHUNTS MV Decel Time: 197 msec    Systemic VTI:  0.16 m MV E velocity: 64.90 cm/s  Systemic Diam: 2.10 cm MV A velocity: 85.20 cm/s MV E/A ratio:  0.76 Laurance Flatten MD Electronically signed by Laurance Flatten MD Signature Date/Time: 03/20/2023/4:15:15 PM    Final    MR BRAIN WO CONTRAST  Result Date: 03/20/2023 CLINICAL DATA:  Provided history: Stroke, follow-up. EXAM: MRI HEAD WITHOUT CONTRAST TECHNIQUE: Multiplanar, multiecho pulse sequences of the brain and surrounding structures were obtained without intravenous contrast. COMPARISON:  Non-contrast head CT 03/19/2023. CT angiogram head/neck 03/20/2023. FINDINGS: Brain: No age advanced or lobar predominant parenchymal atrophy. Multifocal T2 FLAIR hyperintense signal abnormality within the cerebral white matter, nonspecific but compatible with moderate chronic small vessel ischemic disease. There is no acute infarct. No evidence of an intracranial mass. No chronic intracranial blood products. No extra-axial fluid collection. No midline shift. Vascular: Maintained flow voids within the proximal large arterial vessels. Skull and upper cervical spine: No focal suspicious marrow lesion. Susceptibility artifact arising from ACDF hardware the C4-C5 level. Incompletely assessed cervical spondylosis. Sinuses/Orbits: No mass or  acute finding within the imaged orbits. No significant paranasal sinus disease. IMPRESSION: 1.  No evidence of an acute intracranial abnormality. 2. Moderate chronic small vessel ischemic changes within the cerebral white matter. Electronically Signed   By: Jackey Loge D.O.   On: 03/20/2023 12:28   EEG adult  Result Date: 03/20/2023 Charlsie Quest, MD     03/20/2023 11:47 AM Patient Name: Colleen [name] MRN: [id number] Epilepsy Attending: Charlsie Quest Referring Physician/Provider: Marvel Plan, MD Date: 03/20/2023 Duration: 24.50 mins Patient history: 72 y.o. female who presents with altered mental status, unsteady gait, and left-sided numbness that started this morning. EEG to evaluate for seizure. Level of alertness: Awake, asleep AEDs during EEG study: Ativan Technical aspects: This EEG study was done with scalp electrodes positioned according to the 10-20 International system of electrode placement. Electrical activity was reviewed with band pass filter of 1-70Hz , sensitivity of 7 uV/mm, display speed of 30mm/sec with a 60Hz  notched filter applied as appropriate. EEG data were recorded continuously and digitally stored.  Video monitoring was available and reviewed as appropriate. Description: The posterior dominant rhythm consists of 8-9 Hz activity of moderate voltage (25-35 uV) seen predominantly in posterior head regions, symmetric and reactive to eye opening and eye closing. Sleep was characterized by vertex waves, sleep spindles (12 to 14 Hz), maximal frontocentral region.  There is an excessive amount of 15 to 18 Hz beta activity distributed symmetrically and diffusely. Physiologic photic driving was not seen during photic stimulation.  Hyperventilation was not performed.   ABNORMALITY - Excessive beta, generalized IMPRESSION: This study is within normal limits. The excessive beta activity seen in the background is most likely due to the effect of benzodiazepine and is a benign EEG pattern. No  seizures or epileptiform discharges  were seen throughout the recording. A normal interictal EEG does not exclude the diagnosis of epilepsy. Priyanka Annabelle Harman   CT ANGIO HEAD NECK W WO CM  Result Date: 03/20/2023 CLINICAL DATA:  Neuro deficit with stroke suspected EXAM: CT ANGIOGRAPHY HEAD AND NECK WITH AND WITHOUT CONTRAST TECHNIQUE: Multidetector CT imaging of the head and neck was performed using the standard protocol during bolus administration of intravenous contrast. Multiplanar CT image reconstructions and MIPs were obtained to evaluate the vascular anatomy. Carotid stenosis measurements (when applicable) are obtained utilizing NASCET criteria, using the distal internal carotid diameter as the denominator. RADIATION DOSE REDUCTION: This exam was performed according to the departmental dose-optimization program which includes automated exposure control, adjustment of the mA and/or kV according to patient size and/or use of iterative reconstruction technique. CONTRAST:  75mL OMNIPAQUE IOHEXOL 350 MG/ML SOLN COMPARISON:  Head CT from yesterday FINDINGS: CTA NECK FINDINGS Aortic arch: Atheromatous calcification with 3 vessel branching Right carotid system: Partial retropharyngeal course. No significant atheromatous changes. Left carotid system: Partial retropharyngeal course. No significant atheromatous changes. No stenosis or beading. Vertebral arteries: No proximal subclavian or vertebral stenosis. Symmetric vertebral tortuosity without beading or dissection. Skeleton: Cervical spine degeneration and fusion. No acute or aggressive finding. Other neck: No acute finding Upper chest: Clear apical lungs. Review of the MIP images confirms the above findings CTA HEAD FINDINGS Anterior circulation: No significant stenosis, proximal occlusion, aneurysm, or vascular malformation. Posterior circulation: No significant stenosis, proximal occlusion, aneurysm, or vascular malformation. Venous sinuses: Patent when  accounting for arachnoid granulation at the left transverse sigmoid junction. Anterior diverticulum at the right transverse sigmoid junction. Anatomic variants: Hypoplastic P1 segments. Review of the MIP images confirms the above findings IMPRESSION: No emergent finding. Less than typical atheromatous changes for age with no significant stenosis of major arteries in the head and neck. Electronically Signed   By: Tiburcio Pea M.D.   On: 03/20/2023 05:55   CT HEAD WO CONTRAST  Result Date: 03/19/2023 CLINICAL DATA:  Neuro deficit, acute, stroke suspected. EXAM: CT HEAD WITHOUT CONTRAST TECHNIQUE: Contiguous axial images were obtained from the base of the skull through the vertex without intravenous contrast. RADIATION DOSE REDUCTION: This exam was performed according to the departmental dose-optimization program which includes automated exposure control, adjustment of the mA and/or kV according to patient size and/or use of iterative reconstruction technique. COMPARISON:  Head CT 05/27/2004. FINDINGS: Brain: No acute hemorrhage. Patchy hypoattenuation of the cerebral white matter, most consistent with moderate chronic small-vessel disease. No hydrocephalus or extra-axial collection. No mass effect or midline shift. Vascular: No hyperdense vessel or unexpected calcification. Skull: No calvarial fracture or suspicious bone lesion. Skull base is unremarkable. Sinuses/Orbits: Unremarkable. Other: None. IMPRESSION: 1. No acute intracranial abnormality. 2. Moderate chronic small-vessel disease. Electronically Signed   By: Orvan Falconer M.D.   On: 03/19/2023 11:48    DISCHARGE EXAMINATION: Vitals:   03/20/23 2311 03/21/23 0310 03/21/23 0758 03/21/23 0800  BP: 110/70 127/81 131/79   Pulse: 67 67 79 68  Resp: 18 18 18 17   Temp: 97.8 F (36.6 C) 97.9 F (36.6 C) 98.1 F (36.7 C)   TempSrc: Oral Oral Oral   SpO2: 97% 99% 100% 97%  Weight:      Height:       General appearance: Awake alert.  In no  distress Resp: Clear to auscultation bilaterally.  Normal effort Cardio: S1-S2 is normal regular.  No S3-S4.  No rubs murmurs or bruit GI: Abdomen is soft.  Nontender nondistended.  Bowel sounds are present normal.  No masses organomegaly   DISPOSITION: Home  Discharge Instructions     Call MD for:  difficulty breathing, headache or visual disturbances   Complete by: As directed    Call MD for:  extreme fatigue   Complete by: As directed    Call MD for:  persistant dizziness or light-headedness   Complete by: As directed    Call MD for:  persistant nausea and vomiting   Complete by: As directed    Call MD for:  severe uncontrolled pain   Complete by: As directed    Call MD for:  temperature >100.4   Complete by: As directed    Diet - low sodium heart healthy   Complete by: As directed    Discharge instructions   Complete by: As directed    Please take your medications as prescribed.  Please be sure to follow-up with Dr. Allena Katz with neurology.  You were cared for by a hospitalist during your hospital stay. If you have any questions about your discharge medications or the care you received while you were in the hospital after you are discharged, you can call the unit and asked to speak with the hospitalist on call if the hospitalist that took care of you is not available. Once you are discharged, your primary care physician will handle any further medical issues. Please note that NO REFILLS for any discharge medications will be authorized once you are discharged, as it is imperative that you return to your primary care physician (or establish a relationship with a primary care physician if you do not have one) for your aftercare needs so that they can reassess your need for medications and monitor your lab values. If you do not have a primary care physician, you can call [phone] for a physician referral.   Increase activity slowly   Complete by: As directed          Allergies as of  03/21/2023       Reactions   Shellfish Allergy Shortness Of Breath, Swelling   Shrimp (diagnostic) Anaphylaxis   Penicillins Hives, Swelling, Other (See Comments)   Did it involve swelling of the face/tongue/throat, SOB, or low BP? Yes Did it involve sudden or severe rash/hives, skin peeling, or any reaction on the inside of your mouth or nose? Yes Did you need to seek medical attention at a hospital or doctor's office? Yes When did it last happen? Late 20's If all above answers are "NO", may proceed with cephalosporin use.        Medication List     TAKE these medications    albuterol 108 (90 Base) MCG/ACT inhaler Commonly known as: Proventil HFA Inhale 2 puffs into the lungs every 4 (four) hours as needed for wheezing or shortness of breath.   amLODipine 10 MG tablet Commonly known as: NORVASC Take 10 mg by mouth daily.   ascorbic acid 500 MG tablet Commonly known as: VITAMIN C Take 500 mg by mouth daily.   aspirin EC 81 MG tablet Take 81 mg by mouth once.   atorvastatin 40 MG tablet Commonly known as: LIPITOR Take 1 tablet (40 mg total) by mouth daily.   CENTRUM SILVER PO Take 1 tablet by mouth daily.   cholecalciferol 25 MCG (1000 UNIT) tablet Commonly known as: VITAMIN D3 Take 1,000 Units by mouth daily.   entecavir 0.5 MG tablet Commonly known as: BARACLUDE Take 0.5 mg by mouth daily.  famotidine 20 MG tablet Commonly known as: PEPCID Take 1 tablet (20 mg total) by mouth 2 (two) times daily for 3 days.   fluticasone 50 MCG/ACT nasal spray Commonly known as: FLONASE Place 2 sprays into both nostrils daily.   fluticasone-salmeterol 250-50 MCG/ACT Aepb Commonly known as: ADVAIR Inhale 1 puff into the lungs every 12 (twelve) hours.   gabapentin 300 MG capsule Commonly known as: NEURONTIN Take 1 capsule (300 mg total) by mouth 2 (two) times daily. What changed: when to take this   hydrochlorothiazide 25 MG tablet Commonly known as:  HYDRODIURIL Take 25 mg by mouth daily.   hydrOXYzine 25 MG tablet Commonly known as: ATARAX Take 1 tablet (25 mg total) by mouth every 6 (six) hours.   melatonin 5 MG Tabs Take 1 tablet (5 mg total) by mouth at bedtime as needed (insomnia).   montelukast 10 MG tablet Commonly known as: SINGULAIR Take 1 tablet (10 mg total) by mouth at bedtime.   omeprazole 20 MG capsule Commonly known as: PRILOSEC Take 20 mg by mouth daily.   vitamin E 45 MG (100 UNITS) capsule Take 100 Units by mouth daily.               Durable Medical Equipment  (From admission, onward)           Start     Ordered   03/20/23 1357  For home use only DME Walker rolling  Once       Question Answer Comment  Walker: With 5 Inch Wheels   Patient needs a walker to treat with the following condition Weakness      03/20/23 1356              Follow-up Information     Nita Sickle K, DO. Schedule an appointment as soon as possible for a visit in 1 month(s).   Specialty: Neurology Contact information: [address] [place] [address] [phone]                 TOTAL DISCHARGE TIME: 35 minutes  Jaxzen Vanhorn Rito Ehrlich  [facility] Pager on [email or web address]  03/21/2023, 12:56 PM

## 2023-03-21 NOTE — Evaluation (Signed)
Occupational Therapy Evaluation Patient Details Name: Colleen [name] MRN: [id number] DOB: 04/30/1951 Today's Date: 03/21/2023   History of Present Illness Pt is a 72 y.o. F who presents 03/19/2023 with altered mental status, unsteady gait, left sided numbness. CT head and MRI brain negative for acute abnormality.Significant PMH: chronic hepatitis B, asthma, COPD, HTN.   Clinical Impression   PTA, pt lives with family and typically Modified Independent with ADLs, IADLs and mobility with intermittent cane vs RW use. 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 Pt presents now with deficits in speech, higher level cognition and dynamic standing balance. BUE coordination, strength and sensation WFL; vision WFL with bifocal glasses. Pt able to mobilize to/from bathroom using RW and manage ADLs with no more than Supervision. Plan to further assess higher level functional cognition/management during IADLs and would recommend continued follow up in this area with HHOT on DC.     Recommendations for follow up therapy are one component of a multi-disciplinary discharge planning process, led by the attending physician.  Recommendations may be updated based on patient status, additional functional criteria and insurance authorization.   Assistance Recommended at Discharge Set up Supervision/Assistance  Patient can return home with the following Assistance with cooking/housework;Direct supervision/assist for medications management;Direct supervision/assist for financial management;Assist for transportation    Functional Status Assessment  Patient has had a recent decline in their functional status and demonstrates the ability to make significant improvements in function in a reasonable and predictable amount of time.  Equipment Recommendations  None recommended by OT    Recommendations for Other Services       Precautions / Restrictions Precautions Precautions:  Fall Restrictions Weight Bearing Restrictions: No      Mobility Bed Mobility Overal bed mobility: Independent                  Transfers Overall transfer level: Modified independent Equipment used: Rolling walker (2 wheels) Transfers: Sit to/from Stand Sit to Stand: Modified independent (Device/Increase time)                  Balance Overall balance assessment: Needs assistance Sitting-balance support: No upper extremity supported, Feet supported Sitting balance-Leahy Scale: Good     Standing balance support: Bilateral upper extremity supported, During functional activity, No upper extremity supported Standing balance-Leahy Scale: Fair                             ADL either performed or assessed with clinical judgement   ADL Overall ADL's : Needs assistance/impaired Eating/Feeding: Independent   Grooming: Supervision/safety;Standing;Wash/dry hands   Upper Body Bathing: Modified independent   Lower Body Bathing: Supervison/ safety   Upper Body Dressing : Modified independent   Lower Body Dressing: Supervision/safety   Toilet Transfer: Supervision/safety;Ambulation;Rolling walker (2 wheels) Toilet Transfer Details (indicate cue type and reason): slow steady gait Toileting- Clothing Manipulation and Hygiene: Supervision/safety;Sitting/lateral lean;Sit to/from stand Toileting - Clothing Manipulation Details (indicate cue type and reason): managing hygiene, standing from toilet without assist     Functional mobility during ADLs: Supervision/safety;Rolling walker (2 wheels) General ADL Comments: No assist needed for ADLs but noted with deficits that may impact IADL abilities (reading cooking instructions, med mgmt, etc)     Vision Baseline Vision/History: 1 Wears glasses Ability to See in Adequate Light: 0 Adequate Patient Visual Report: No change from baseline Vision Assessment?: No apparent visual deficits     Perception  Praxis       Pertinent Vitals/Pain Pain Assessment Pain Assessment: No/denies pain     Hand Dominance Right   Extremity/Trunk Assessment Upper Extremity Assessment Upper Extremity Assessment: Overall WFL for tasks assessed   Lower Extremity Assessment Lower Extremity Assessment: Defer to PT evaluation   Cervical / Trunk Assessment Cervical / Trunk Assessment: Kyphotic   Communication Communication Communication: Expressive difficulties   Cognition Arousal/Alertness: Awake/alert Behavior During Therapy: WFL for tasks assessed/performed Overall Cognitive Status: Difficult to assess                                 General Comments: Able to follow one step and multi-step commands frequently, showing insight into deficits. able to recall speaking strategies that speech has taught her. noted issues readiing signs in room (not due to vision)     General Comments       Exercises     Shoulder Instructions      Home Living Family/patient expects to be discharged to:: Private residence Living Arrangements: Children Available Help at Discharge: Family;Available PRN/intermittently Type of Home: House Home Access: Stairs to enter Entrance Stairs-Number of Steps: 3 Entrance Stairs-Rails: Right Home Layout: One level     Bathroom Shower/Tub: Producer, television/film/video: Handicapped height     Home Equipment: Agricultural consultant (2 wheels);Cane - single point;Shower seat          Prior Functioning/Environment Prior Level of Function : Independent/Modified Independent             Mobility Comments: RW vs cane PRN due to hx of knee replacement ADLs Comments: Reports independence with ADL's, IADLs, 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        OT Problem List: Impaired balance (sitting and/or standing);Decreased cognition      OT Treatment/Interventions: Self-care/ADL training;Therapeutic exercise;Energy conservation;DME and/or AE  instruction;Therapeutic activities;Balance training;Patient/family education    OT Goals(Current goals can be found in the care plan section) Acute Rehab OT Goals Patient Stated Goal: home soon, get better OT Goal Formulation: With patient Time For Goal Achievement: 04/04/23 Potential to Achieve Goals: Good  OT Frequency: Min 2X/week    Co-evaluation              AM-PAC OT "6 Clicks" Daily Activity     Outcome Measure Help from another person eating meals?: None Help from another person taking care of personal grooming?: A Little Help from another person toileting, which includes using toliet, bedpan, or urinal?: A Little Help from another person bathing (including washing, rinsing, drying)?: A Little Help from another person to put on and taking off regular upper body clothing?: None Help from another person to put on and taking off regular lower body clothing?: A Little 6 Click Score: 20   End of Session Equipment Utilized During Treatment: Rolling walker (2 wheels) Nurse Communication: Mobility status  Activity Tolerance: Patient tolerated treatment well Patient left: in bed;with call bell/phone within reach;with bed alarm set  OT Visit Diagnosis: Other abnormalities of gait and mobility (R26.89);Cognitive communication deficit (R41.841)                Time: 1308-6578 OT Time Calculation (min): 20 min Charges:  OT General Charges $OT Visit: 1 Visit OT Evaluation $OT Eval Low Complexity: 1 Low  Bradd Canary, OTR/L Acute Rehab Services Office: [phone]   Lorre Munroe 03/21/2023, 8:04 AM

## 2023-03-21 NOTE — Progress Notes (Signed)
   03/20/23 2337  BiPAP/CPAP/SIPAP  Reason BIPAP/CPAP not in use Non-compliant (PT tried cpap for 2 mnts then  took it off.)

## 2023-03-24 ENCOUNTER — Encounter: Payer: Self-pay | Admitting: Neurology

## 2023-04-19 NOTE — Progress Notes (Signed)
Initial neurology clinic note  JHANE VANDERZWAAG MRN: [id number] DOB: 08/23/1951  Referring provider: Osvaldo Shipper, MD  Primary care provider: Patient, No Pcp Per  Reason for consult:  AMS, aphasia, dizziness  Subjective:  This is Ms. Colleen [name], a 72 y.o. right-handed female with a medical history of HTN, HLD, chronic hep B, cirrhosis of liver, COPD, OSA, depression, and anxiety who presents to neurology clinic with speech problems. The patient is accompanied by daughter and granddaughter.  Patient was admitted from 03/19/23 to 03/21/23 for AMS, speech changes, unsteady gait, and left sided numbness that started morning of 03/19/23. She uses a cane or walker at baseline, but was even more unsteady that morning. There was concern for stroke, so neurology saw patient and did stroke work up. MRI showed no acute process. CTA was unremarkable. EEG showed no concern for seizure. Speech was described as stuttering and distractible. Further history revealed a lot of recent stress at work, so there was some consideration that symptoms could be due to anxiety. Patient was already on asa 81 mg, so this was continued. Atorvastatin 40 mg daily was added due to LDL 111. Patient is currently doing speech and physical therapy (in home). She thinks the therapy has helped.  Her current symptoms include speech changes, pain in the neck, hip pain, knee pain, and change in cognition. Regarding her speech, she sometimes has difficulty with speech, with stuttering and hoarseness. It comes and goes. There is no real pattern. She may still have a little left sided weakness, but this has improved. She denies falls. She has pain in her neck, left hip, and right knee. She sees ortho for this and gets injections for this. When she gets high anxiety, she gets mixed up. She has a lot of increased stress lately. Her daughter passed away this past week. She was recently started on atarax for anxiety. She thinks it is  working.  She endorses insomnia. She takes melatonin. She feels well rested now that she is on melatonin.  Patient saw Dr. Allena Katz on 01/11/16 for bilateral hand pain. She has a history of bilateral carpal tunnel surgery. This is doing okay.  Patient takes gabapentin 300 mg BID for pain (muscle pain and tingling in left hand occasionally).  MEDICATIONS:  Outpatient Encounter Medications as of 05/03/2023  Medication Sig   albuterol (PROVENTIL HFA) 108 (90 Base) MCG/ACT inhaler Inhale 2 puffs into the lungs every 4 (four) hours as needed for wheezing or shortness of breath.   amLODipine (NORVASC) 10 MG tablet Take 10 mg by mouth daily.   ascorbic acid (VITAMIN C) 500 MG tablet Take 500 mg by mouth daily.   aspirin EC 81 MG tablet Take 81 mg by mouth once.   atorvastatin (LIPITOR) 40 MG tablet Take 1 tablet (40 mg total) by mouth daily.   cholecalciferol (VITAMIN D3) 25 MCG (1000 UNIT) tablet Take 1,000 Units by mouth daily.   entecavir (BARACLUDE) 0.5 MG tablet Take 0.5 mg by mouth daily.   fluticasone (FLONASE) 50 MCG/ACT nasal spray Place 2 sprays into both nostrils daily.   fluticasone-salmeterol (ADVAIR) 250-50 MCG/ACT AEPB Inhale 1 puff into the lungs every 12 (twelve) hours.   gabapentin (NEURONTIN) 300 MG capsule Take 1 capsule (300 mg total) by mouth 2 (two) times daily. (Patient taking differently: Take 300 mg by mouth at bedtime.)   hydrochlorothiazide (HYDRODIURIL) 25 MG tablet Take 25 mg by mouth daily.   hydrOXYzine (ATARAX) 25 MG tablet Take 1 tablet (25  mg total) by mouth every 6 (six) hours.   melatonin 5 MG TABS Take 1 tablet (5 mg total) by mouth at bedtime as needed (insomnia).   montelukast (SINGULAIR) 10 MG tablet Take 1 tablet (10 mg total) by mouth at bedtime.   Multiple Vitamins-Minerals (CENTRUM SILVER PO) Take 1 tablet by mouth daily.   omeprazole (PRILOSEC) 20 MG capsule Take 20 mg by mouth daily.    vitamin E 100 UNIT capsule Take 100 Units by mouth daily.    famotidine (PEPCID) 20 MG tablet Take 1 tablet (20 mg total) by mouth 2 (two) times daily for 3 days.   No facility-administered encounter medications on file as of 05/03/2023.    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 Yes Did it involve sudden or severe rash/hives, skin peeling, or any reaction on the inside of your mouth or nose? Yes Did you need to seek medical attention at a hospital or doctor's office? Yes When did it last happen? Late 20's If all above answers are "NO", may proceed with cephalosporin use.     FAMILY HISTORY: Family History  Problem Relation Age of Onset   Cancer Mother    Cancer Father     SOCIAL HISTORY: Social History   Tobacco Use   Smoking status: Never   Smokeless tobacco: Never  Vaping Use   Vaping status: Never Used  Substance Use Topics   Alcohol use: No    Alcohol/week: 0.0 standard drinks of alcohol   Drug use: No   Social History   Social History Narrative   Lives with 2 of her children  in a one story home.  Has 3 children.  Works as an in Biomedical engineer.     Are you right handed or left handed? Right   Are you currently employed ?    What is your current occupation? retired   Do you live at home alone?   Who lives with you?  daughter   What type of home do you live in: 1 story or 2 story? one   Caffeine rarely    Objective:  Vital Signs:  BP 135/88   Pulse 79   Ht 5\' 2"  (1.575 m)   Wt 222 lb (100.7 kg)   SpO2 97%   BMI 40.60 kg/m   General: No acute distress.  Patient appears well-groomed.   Head:  Normocephalic/atraumatic Neck: supple. Trapezius tenderness,  particularly on the left. Heart: regular rate and rhythm Lungs: Clear to auscultation bilaterally. Vascular: No carotid bruits.  Neurological Exam: Mental status: alert and oriented, speech fluent and not dysarthric, language intact. Occasional stuttering. 2/3 in delayed recall.  Cranial nerves: CN I: not tested CN II: pupils equal, round and reactive to light, visual fields intact CN III, IV, VI:  full range of motion, no nystagmus, no ptosis CN V: facial sensation intact. CN VII: upper and lower face symmetric CN VIII: hearing intact CN IX, X: uvula midline CN XI: sternocleidomastoid and trapezius muscles intact CN XII: tongue midline  Bulk & Tone: normal, no fasciculations. Motor:  muscle strength 5/5 throughout Deep Tendon Reflexes:  2+ in bilateral upper extremities, 1+ in bilateral lower extremities   Sensation:  Pinprick sensation intact. Finger to nose testing:  Without dysmetria.   Gait:  Normal station. Antalgic gait with cane.  Romberg negative.   Labs and Imaging review: Internal labs: Lab Results  Component Value Date   HGBA1C 5.6 03/20/2023   03/20/23: HIV non-reactive Drug screen: negative CMP and CBC unremarkable Lipid panel:  Component     Latest Ref Rng 03/20/2023  Cholesterol     0 - 200 mg/dL 295   Triglycerides     <150 mg/dL 73   HDL Cholesterol     >40 mg/dL 36  (L)   Total CHOL/HDL Ratio     RATIO 4.5   VLDL     0 - 40 mg/dL 15   LDL (calc)     0 - 99 mg/dL 284 (H)      Imaging: CT head wo contrast (03/19/23): FINDINGS: Brain: No acute hemorrhage. Patchy hypoattenuation of the cerebral white matter, most consistent with moderate chronic small-vessel disease. No hydrocephalus or extra-axial collection. No mass effect or midline shift.   Vascular: No hyperdense vessel or unexpected calcification.   Skull: No calvarial fracture or suspicious bone lesion. Skull base is unremarkable.   Sinuses/Orbits: Unremarkable.   Other: None.   IMPRESSION: 1. No acute intracranial abnormality. 2. Moderate chronic small-vessel disease.  CTA head and neck (03/20/23): FINDINGS: CTA NECK FINDINGS   Aortic arch: Atheromatous calcification with 3 vessel branching   Right carotid system: Partial retropharyngeal course. No significant atheromatous changes.   Left carotid system: Partial retropharyngeal course. No significant atheromatous changes. No stenosis or beading.   Vertebral arteries: No proximal subclavian or vertebral stenosis. Symmetric vertebral tortuosity without beading or dissection.   Skeleton: Cervical spine degeneration and fusion. No acute or aggressive finding.   Other neck: No acute finding   Upper chest: Clear apical lungs.   Review of the MIP images confirms the above findings   CTA HEAD FINDINGS   Anterior circulation: No significant stenosis, proximal occlusion, aneurysm, or vascular malformation.   Posterior circulation: No significant stenosis, proximal occlusion, aneurysm, or vascular malformation.   Venous sinuses: Patent when accounting for arachnoid granulation at the left transverse sigmoid junction. Anterior diverticulum at the right transverse sigmoid junction.   Anatomic variants: Hypoplastic P1 segments.   Review of the MIP images confirms the above findings   IMPRESSION: No emergent finding.    Less than typical atheromatous changes for age with no significant stenosis of major arteries in the head and neck.  MRI brain wo contrast (03/20/23): FINDINGS: Brain:   No age advanced or lobar predominant parenchymal atrophy.   Multifocal T2 FLAIR hyperintense signal abnormality within the cerebral white matter, nonspecific but compatible with moderate chronic small vessel ischemic  disease.   There is no acute infarct.   No evidence of an intracranial mass.   No chronic intracranial blood products.   No extra-axial fluid collection.   No midline shift.   Vascular: Maintained flow voids within the proximal large arterial vessels.   Skull and upper cervical spine: No focal suspicious marrow lesion. Susceptibility artifact arising from ACDF hardware the C4-C5 level. Incompletely assessed cervical spondylosis.   Sinuses/Orbits: No mass or acute finding within the imaged orbits. No significant paranasal sinus disease.   IMPRESSION: 1.  No evidence of an acute intracranial abnormality. 2. Moderate chronic small vessel ischemic changes within the cerebral white matter.  EEG (03/20/23): Description: The posterior dominant rhythm consists of 8-9 Hz activity of moderate voltage (25-35 uV) seen predominantly in posterior head regions, symmetric and reactive to eye opening and eye closing. Sleep was characterized by vertex waves, sleep spindles (12 to 14 Hz), maximal frontocentral region.  There is an excessive amount of 15 to 18 Hz beta activity distributed symmetrically and diffusely. Physiologic photic driving was not seen during photic stimulation.  Hyperventilation was not performed.      ABNORMALITY - Excessive beta, generalized   IMPRESSION: This study is within normal limits. The excessive beta activity seen in the background is most likely due to the effect of benzodiazepine and is a benign EEG pattern. No seizures or epileptiform discharges were seen throughout the  recording.  Echocardiogram (03/20/23):  1. Left ventricular ejection fraction, by estimation, is 60 to 65%. The  left ventricle has normal function. The left ventricle has no regional  wall motion abnormalities. Left ventricular diastolic parameters are  consistent with Grade I diastolic  dysfunction (impaired relaxation).   2. Right ventricular systolic function is normal. The right ventricular  size is normal.   3. The mitral valve is grossly normal. Trivial mitral valve  regurgitation. No evidence of mitral stenosis.   4. The aortic valve was not well visualized. Aortic valve regurgitation  is not visualized. No aortic stenosis is present.  LA normal in size  Assessment/Plan:  Colleen [name] is a 72 y.o. female who presents for evaluation of AMS, speech changes, and improved left sided weakness and numbness. She has a relevant medical history of HTN, HLD, chronic hep B, cirrhosis of liver, COPD, OSA, depression, and anxiety. Her neurological examination is essentially normal with the exception of 2/3 in delayed recall. Available diagnostic data is significant for MRI brain showing no acute infarct, CTA head and neck showing no significant stenosis, and EEG being normal. Patient does not appear to have an irreversible neurologic condition such as stroke or dementia. Given her significant stress and anxiety, stuttering speech which is typically not neurologic, and normal neurologic work up, the most likely etiology of symptoms is depression and anxiety related. I will do work up for treatable causes. I encouraged patient to discuss further with PCP to optimize mood. If symptoms persist despite fixing sleep and mood, then further neurologic work up would be warranted.  PLAN: -Blood work: B1, B12, vit D, TSH -Recommend following up with PCP regarding anxiety and depression -Continue PT and speech therapy  -Return to clinic as needed  The impression above as well as the plan as outlined  below were extensively discussed with the patient (in the company of daughter and granddaughter) who voiced understanding. All questions were answered to their satisfaction.  The patient was counseled on pertinent fall precautions per the printed material provided today, and as noted under  the "Patient Instructions" section below.  When available, results of the above investigations and possible further recommendations will be communicated to the patient via telephone/MyChart. Patient to call office if not contacted after expected testing turnaround time.   Total time spent reviewing records, interview, history/exam, documentation, and coordination of care on day of encounter:  50 min   Thank you for allowing me to participate in patient's care.  If I can answer any additional questions, I would be pleased to do so.  Jacquelyne Balint, MD   CC: Patient, No Pcp Per No address on file  CC: Referring provider: Osvaldo Shipper, MD [address] [place],  [place] [address]

## 2023-05-03 ENCOUNTER — Other Ambulatory Visit (INDEPENDENT_AMBULATORY_CARE_PROVIDER_SITE_OTHER): Payer: 59

## 2023-05-03 ENCOUNTER — Ambulatory Visit (INDEPENDENT_AMBULATORY_CARE_PROVIDER_SITE_OTHER): Payer: 59 | Admitting: Neurology

## 2023-05-03 ENCOUNTER — Encounter: Payer: Self-pay | Admitting: Neurology

## 2023-05-03 VITALS — BP 135/88 | HR 79 | Ht 62.0 in | Wt 222.0 lb

## 2023-05-03 DIAGNOSIS — R479 Unspecified speech disturbances: Secondary | ICD-10-CM

## 2023-05-03 DIAGNOSIS — M25561 Pain in right knee: Secondary | ICD-10-CM

## 2023-05-03 DIAGNOSIS — M542 Cervicalgia: Secondary | ICD-10-CM

## 2023-05-03 DIAGNOSIS — F8081 Childhood onset fluency disorder: Secondary | ICD-10-CM | POA: Diagnosis not present

## 2023-05-03 DIAGNOSIS — R4189 Other symptoms and signs involving cognitive functions and awareness: Secondary | ICD-10-CM

## 2023-05-03 DIAGNOSIS — F419 Anxiety disorder, unspecified: Secondary | ICD-10-CM | POA: Diagnosis not present

## 2023-05-03 DIAGNOSIS — G8929 Other chronic pain: Secondary | ICD-10-CM

## 2023-05-03 DIAGNOSIS — M25552 Pain in left hip: Secondary | ICD-10-CM

## 2023-05-03 DIAGNOSIS — F329 Major depressive disorder, single episode, unspecified: Secondary | ICD-10-CM

## 2023-05-03 LAB — VITAMIN D 25 HYDROXY (VIT D DEFICIENCY, FRACTURES): VITD: 28.65 ng/mL — ABNORMAL LOW (ref 30.00–100.00)

## 2023-05-03 LAB — VITAMIN B12: Vitamin B-12: 901 pg/mL (ref 211–911)

## 2023-05-03 LAB — TSH: TSH: 1 u[IU]/mL (ref 0.35–5.50)

## 2023-05-03 NOTE — Patient Instructions (Signed)
I do not see evidence of an irreversible neurologic condition today such as a stroke or dementia.  I think your symptoms are most likely secondary to depression and anxiety.  I would like to investigate your symptoms further with blood work today. I will be in touch when I have the results.  Continue physical therapy and speech therapy.  Return to clinic as needed.  The physicians and staff at [facility] are committed to providing excellent care. You may receive a survey requesting feedback about your experience at our office. We strive to receive "very good" responses to the survey questions. If you feel that your experience would prevent you from giving the office a "very good " response, please contact our office to try to remedy the situation. We may be reached at [phone]. Thank you for taking the time out of your busy day to complete the survey.  Jacquelyne Balint, MD [facility]  Preventing Falls at [facility] are common, often dreaded events in the lives of older people. Aside from the obvious injuries and even death that may result, fall can cause wide-ranging consequences including loss of independence, mental decline, decreased activity and mobility. Younger people are also at risk of falling, especially those with chronic illnesses and fatigue.  Ways to reduce risk for falling Examine diet and medications. Warm foods and alcohol dilate blood vessels, which can lead to dizziness when standing. Sleep aids, antidepressants and pain medications can also increase the likelihood of a fall.  Get a vision exam. Poor vision, cataracts and glaucoma increase the chances of falling.  Check foot gear. Shoes should fit snugly and have a sturdy, nonskid sole and a broad, low heel  Participate in a physician-approved exercise program to build and maintain muscle strength and improve balance and coordination. Programs that use ankle weights or stretch bands are excellent for  muscle-strengthening. Water aerobics programs and low-impact Tai Chi programs have also been shown to improve balance and coordination.  Increase vitamin D intake. Vitamin D improves muscle strength and increases the amount of calcium the body is able to absorb and deposit in bones.  How to prevent falls from common hazards Floors - Remove all loose wires, cords, and throw rugs. Minimize clutter. Make sure rugs are anchored and smooth. Keep furniture in its usual place.  Chairs -- Use chairs with straight backs, armrests and firm seats. Add firm cushions to existing pieces to add height.  Bathroom - Install grab bars and non-skid tape in the tub or shower. Use a bathtub transfer bench or a shower chair with a back support Use an elevated toilet seat and/or safety rails to assist standing from a low surface. Do not use towel racks or bathroom tissue holders to help you stand.  Lighting - Make sure halls, stairways, and entrances are well-lit. Install a night light in your bathroom or hallway. Make sure there is a light switch at the top and bottom of the staircase. Turn lights on if you get up in the middle of the night. Make sure lamps or light switches are within reach of the bed if you have to get up during the night.  Kitchen - Install non-skid rubber mats near the sink and stove. Clean spills immediately. Store frequently used utensils, pots, pans between waist and eye level. This helps prevent reaching and bending. Sit when getting things out of lower cupboards.  Living room/ Bedrooms - Place furniture with wide spaces in between, giving enough room to move  around. Establish a route through the living room that gives you something to hold onto as you walk.  Stairs - Make sure treads, rails, and rugs are secure. Install a rail on both sides of the stairs. If stairs are a threat, it might be helpful to arrange most of your activities on the lower level to reduce the number of times you must climb  the stairs.  Entrances and doorways - Install metal handles on the walls adjacent to the doorknobs of all doors to make it more secure as you travel through the doorway.  Tips for maintaining balance Keep at least one hand free at all times. Try using a backpack or fanny pack to hold things rather than carrying them in your hands. Never carry objects in both hands when walking as this interferes with keeping your balance.  Attempt to swing both arms from front to back while walking. This might require a conscious effort if Parkinson's disease has diminished your movement. It will, however, help you to maintain balance and posture, and reduce fatigue.  Consciously lift your feet off of the ground when walking. Shuffling and dragging of the feet is a common culprit in losing your balance.  When trying to navigate turns, use a "U" technique of facing forward and making a wide turn, rather than pivoting sharply.  Try to stand with your feet shoulder-length apart. When your feet are close together for any length of time, you increase your risk of losing your balance and falling.  Do one thing at a time. Don't try to walk and accomplish another task, such as reading or looking around. The decrease in your automatic reflexes complicates motor function, so the less distraction, the better.  Do not wear rubber or gripping soled shoes, they might "catch" on the floor and cause tripping.  Move slowly when changing positions. Use deliberate, concentrated movements and, if needed, use a grab bar or walking aid. Count 15 seconds between each movement. For example, when rising from a seated position, wait 15 seconds after standing to begin walking.  If balance is a continuous problem, you might want to consider a walking aid such as a cane, walking stick, or walker. Once you've mastered walking with help, you might be ready to try it on your own again.

## 2023-05-31 ENCOUNTER — Other Ambulatory Visit: Payer: Self-pay | Admitting: Pulmonary Disease

## 2023-07-05 ENCOUNTER — Ambulatory Visit: Payer: Medicare Other | Admitting: Nurse Practitioner

## 2023-07-05 ENCOUNTER — Encounter: Payer: Self-pay | Admitting: Nurse Practitioner

## 2023-07-05 VITALS — BP 140/80 | HR 98 | Ht 61.0 in | Wt 219.4 lb

## 2023-07-05 DIAGNOSIS — J4540 Moderate persistent asthma, uncomplicated: Secondary | ICD-10-CM | POA: Diagnosis not present

## 2023-07-05 DIAGNOSIS — G4733 Obstructive sleep apnea (adult) (pediatric): Secondary | ICD-10-CM | POA: Diagnosis not present

## 2023-07-05 DIAGNOSIS — Z6841 Body Mass Index (BMI) 40.0 and over, adult: Secondary | ICD-10-CM

## 2023-07-05 NOTE — Patient Instructions (Addendum)
We discussed how untreated sleep apnea puts an individual at risk for cardiac arrhthymias, pulm HTN, DM, stroke and increases their risk for daytime accidents.   Start CPAP 5-15 cmH2O with mask of choice and heated humidification  Use every night, minimum of 4-6 hours a night but goal is to wear the entire night  Change equipment as directed. Wash your tubing with warm soap and water daily, hang to dry. Wash humidifier portion weekly. Change water daily with bottled distilled water  Be aware of reduced alertness and do not drive or operate heavy machinery if experiencing this or drowsiness.  Healthy weight management discussed.  Notify if persistent daytime sleepiness occurs even with consistent use of CPAP.   Continue Albuterol inhaler 2 puffs every 6 hours as needed for shortness of breath or wheezing. Notify if symptoms persist despite rescue inhaler/neb use. Continue flonase nasal spray 2 sprays each nostril daily Continue advair 1 puff Twice daily. Brush tongue and rinse mouth afterwards Continue montelukast (singulair) 1 tab At bedtime    Follow up in 10-12 weeks with Colleen [name] or Colleen Chalon Zobrist,NP. If symptoms do not improve or worsen, please contact office for sooner follow up or seek emergency care.

## 2023-07-05 NOTE — Progress Notes (Unsigned)
@Patient  ID: [facility], female    DOB: [birth year], 72 y.o.   MRN: [id number]  Chief Complaint  Patient presents with   Follow-up    Referring provider: No ref. provider found  HPI: 72 year old female, never smoker followed for asthma, allergic rhinitis and mild OSA.  She is a patient of Dr. Evlyn Courier and last seen in office 09/2022.  Past medical history significant for GERD, arthritis, chronic hepatitis B with cirrhosis, HLD, hypertension.  TEST/EVENTS:  07/20/2022 PFT: FVC 51, FEV1 58, ratio 89, TLC 80, DLCOcor 112 08/17/2022 HST: AHI 15.7/h, SpO2 low 74%, average 94%  08/02/2022: OV with Dr. Craige Cotta. CXR from August nl. IgE 207, eos 0.1. PFT mixed obstruction and restriction. Sinus congestion and postnasal drip. Gets itchy/watery eyes when allergies flare. Still having trouble with sleep and loud snoring. HST previously ordered - will f/u on this. Advised to trial nasal irrigations daily and OTC olopatadine eye drops. Continued advair, singulair, flonase  09/15/2022: OV with Dayne Chait NP for follow-up with her daughter to discuss home sleep study results.  She had HST on 11/8 which revealed moderate obstructive sleep apnea with AHI of 15.7.  She continues to have trouble with her sleep and snores mildly at night.  She also feels like sometimes when she is driving she forgets where she is going.  She feels very tired throughout the day.  Denies any sleep parasomnia/paralysis, drowsy driving.  No history of narcolepsy or cataplexy.  Her breathing is overall stable.  She continues on Advair and Singulair.  She also uses Flonase and occasionally uses nasal irrigations for her allergies, which seem to be well-controlled right now.  07/05/2023: Today - follow up Patient presents today for overdue follow up.  After her last visit, she was ordered a CPAP machine but never received this.  She had called in January regarding this and [employer] had said that they were going to run it after the first of the  year.  She never heard anything else.  Her daughter passed away 2 months ago so she has not been as focused on her health.  She knows that she needs to go ahead and get started on therapy and so she wanted to come back and see [place].  She still having trouble with sleep at night.  Feels like she is tired during the day.  Does not feel like she sleeps well.  Denies any drowsy driving or morning headaches.   Breathing has been doing very well.  She is currently well-controlled.  Has not had any flares requiring steroids or antibiotics.  Rare use of her rescue inhaler.  Allergies  Allergen Reactions   Shellfish Allergy Shortness Of Breath and Swelling   Shrimp (Diagnostic) Anaphylaxis   Penicillins Hives, Swelling and Other (See Comments)    Did it involve swelling of the face/tongue/throat, SOB, or low BP? Yes Did it involve sudden or severe rash/hives, skin peeling, or any reaction on the inside of your mouth or nose? Yes Did you need to seek medical attention at a hospital or doctor's office? Yes When did it last happen? Late 20's If all above answers are "NO", may proceed with cephalosporin use.     Immunization History  Administered Date(s) Administered   Hepatitis A, Ped/Adol-2 Dose 07/28/2011   Influenza Split 07/30/2013   Influenza, High Dose Seasonal PF 08/28/2018   Influenza, Quadrivalent, Recombinant, Inj, Pf 07/25/2017   Influenza,inj,Quad PF,6+ Mos 07/15/2016   PFIZER(Purple Top)SARS-COV-2 Vaccination 12/06/2019, 12/31/2019, 08/22/2020   Pneumococcal  Conjugate-13 10/25/2017   Tdap 03/12/2015    Past Medical History:  Diagnosis Date   Acid reflux    Arthritis    Asthma    Chronic hepatitis B (HCC)    COPD (chronic obstructive pulmonary disease) (HCC)    Mild   GERD (gastroesophageal reflux disease)    History of iron deficiency anemia    with pregnancy   Hypercholesterolemia    Hypertension    Numbness and tingling of both upper extremities     Tobacco  History: Social History   Tobacco Use  Smoking Status Never  Smokeless Tobacco Never   Counseling given: Not Answered   Outpatient Medications Prior to Visit  Medication Sig Dispense Refill   albuterol (PROVENTIL HFA) 108 (90 Base) MCG/ACT inhaler Inhale 2 puffs into the lungs every 4 (four) hours as needed for wheezing or shortness of breath. 1 each 0   amLODipine (NORVASC) 10 MG tablet Take 10 mg by mouth daily.     ascorbic acid (VITAMIN C) 500 MG tablet Take 500 mg by mouth daily.     aspirin EC 81 MG tablet Take 81 mg by mouth once.     atorvastatin (LIPITOR) 40 MG tablet Take 1 tablet (40 mg total) by mouth daily. 30 tablet 2   cholecalciferol (VITAMIN D3) 25 MCG (1000 UNIT) tablet Take 1,000 Units by mouth daily.     entecavir (BARACLUDE) 0.5 MG tablet Take 0.5 mg by mouth daily.     fluticasone (FLONASE) 50 MCG/ACT nasal spray Place 2 sprays into both nostrils daily. 16 mL 0   fluticasone-salmeterol (ADVAIR) 250-50 MCG/ACT AEPB Inhale 1 puff into the lungs every 12 (twelve) hours. 60 each 11   gabapentin (NEURONTIN) 300 MG capsule Take 1 capsule (300 mg total) by mouth 2 (two) times daily. (Patient taking differently: Take 300 mg by mouth at bedtime.) 60 capsule 5   hydrochlorothiazide (HYDRODIURIL) 25 MG tablet Take 25 mg by mouth daily.     hydrOXYzine (ATARAX) 25 MG tablet Take 1 tablet (25 mg total) by mouth every 6 (six) hours. 12 tablet 0   melatonin 5 MG TABS Take 1 tablet (5 mg total) by mouth at bedtime as needed (insomnia). 15 tablet 0   montelukast (SINGULAIR) 10 MG tablet TAKE 1 TABLET BY MOUTH AT BEDTIME 30 tablet 3   Multiple Vitamins-Minerals (CENTRUM SILVER PO) Take 1 tablet by mouth daily.     omeprazole (PRILOSEC) 20 MG capsule Take 20 mg by mouth daily.      vitamin E 100 UNIT capsule Take 100 Units by mouth daily.     famotidine (PEPCID) 20 MG tablet Take 1 tablet (20 mg total) by mouth 2 (two) times daily for 3 days. 6 tablet 0   No facility-administered  medications prior to visit.     Review of Systems:   Constitutional: No weight loss or gain, night sweats, fevers, chills, or lassitude. +excessive daytime fatigue  HEENT: No headaches, difficulty swallowing, tooth/dental problems, or sore throat. No sneezing, itching, ear ache. +nasal congestion, post nasal drip (stable) CV:  No chest pain, orthopnea, PND, swelling in lower extremities, anasarca, dizziness, palpitations, syncope Resp: +shortness of breath with exertion (baseline); snoring. No excess mucus or change in color of mucus. No productive or non-productive. No hemoptysis. No wheezing.  No chest wall deformity GI:  No heartburn, indigestion, abdominal pain GU: No dysuria, change in color of urine, urgency or frequency.  Skin: No rash, lesions, ulcerations MSK:  No joint pain or swelling.  Neuro: No dizziness or lightheadedness.  Psych: No depression or anxiety. Mood stable. +sleep disturbance    Physical Exam:  BP (!) 140/80 (BP Location: Right Arm, Patient Position: Sitting, Cuff Size: Large)   Pulse 98   Ht 5\' 1"  (1.549 m)   Wt 219 lb 6.4 oz (99.5 kg)   SpO2 98%   BMI 41.46 kg/m   GEN: Pleasant, interactive, well-appearing; obese; in no acute distress. HEENT:  Normocephalic and atraumatic. PERRLA. Sclera white. Nasal turbinates pink, moist and patent bilaterally. No rhinorrhea present. Oropharynx pink and moist, without exudate or edema. No lesions, ulcerations, or postnasal drip. Mallampati III NECK:  Supple w/ fair ROM. No JVD present. Normal carotid impulses w/o bruits. Thyroid symmetrical with no goiter or nodules palpated. No lymphadenopathy.   CV: RRR, no m/r/g, no peripheral edema. Pulses intact, +2 bilaterally. No cyanosis, pallor or clubbing. PULMONARY:  Unlabored, regular breathing. Clear bilaterally A&P w/o wheezes/rales/rhonchi. No accessory muscle use.  GI: BS present and normoactive. Soft, non-tender to palpation. No organomegaly or masses detected.  MSK:  No erythema, warmth or tenderness. Cap refil <2 sec all extrem. No deformities or joint swelling noted.  Neuro: A/Ox3. No focal deficits noted.   Skin: Warm, no lesions or rashe Psych: Normal affect and behavior. Judgement and thought content appropriate.     Lab Results:  CBC    Component Value Date/Time   WBC 6.7 03/20/2023 0732   RBC 4.23 03/20/2023 0732   HGB 12.7 03/20/2023 0732   HCT 39.1 03/20/2023 0732   PLT 243 03/20/2023 0732   MCV 92.4 03/20/2023 0732   MCH 30.0 03/20/2023 0732   MCHC 32.5 03/20/2023 0732   RDW 14.5 03/20/2023 0732   LYMPHSABS 2.9 03/19/2023 1106   MONOABS 0.6 03/19/2023 1106   EOSABS 0.1 03/19/2023 1106   BASOSABS 0.0 03/19/2023 1106    BMET    Component Value Date/Time   NA 135 03/20/2023 0732   K 3.9 03/20/2023 0732   CL 102 03/20/2023 0732   CO2 21 (L) 03/20/2023 0732   GLUCOSE 98 03/20/2023 0732   BUN 10 03/20/2023 0732   CREATININE 0.74 03/20/2023 0732   CALCIUM 9.4 03/20/2023 0732   GFRNONAA >60 03/20/2023 0732   GFRAA >60 05/25/2019 0218    BNP No results found for: "BNP"   Imaging:  No results found.  Administration History     None          Latest Ref Rng & Units 07/20/2022   10:51 AM  PFT Results  FVC-Pre L 1.43   FVC-Predicted Pre % 51   FVC-Post L 1.33   FVC-Predicted Post % 47   Pre FEV1/FVC % % 87   Post FEV1/FCV % % 89   FEV1-Pre L 1.24   FEV1-Predicted Pre % 58   FEV1-Post L 1.18   DLCO uncorrected ml/min/mmHg 20.05   DLCO UNC% % 109   DLCO corrected ml/min/mmHg 20.51   DLCO COR %Predicted % 112   DLVA Predicted % 146   TLC L 3.82   TLC % Predicted % 80   RV % Predicted % 100     No results found for: "NITRICOXIDE"      Assessment & Plan:   Moderate obstructive sleep apnea Moderate OSA with AHI 15.7.  She was supposed be started on CPAP therapy but for some reason never received this from the medical supply company.  Will replace orders today for new start auto CPAP 5-15 cmH2O. Change  DME to [facility]  given her current health insurance requirements.  Educated on proper use/care of device.  Risk/benefits reviewed.  Aware of safe driving practices.  Healthy weight loss encouraged.  Patient Instructions  We discussed how untreated sleep apnea puts an individual at risk for cardiac arrhthymias, pulm HTN, DM, stroke and increases their risk for daytime accidents.   Start CPAP 5-15 cmH2O with mask of choice and heated humidification  Use every night, minimum of 4-6 hours a night but goal is to wear the entire night  Change equipment as directed. Wash your tubing with warm soap and water daily, hang to dry. Wash humidifier portion weekly. Change water daily with bottled distilled water  Be aware of reduced alertness and do not drive or operate heavy machinery if experiencing this or drowsiness.  Healthy weight management discussed.  Notify if persistent daytime sleepiness occurs even with consistent use of CPAP.   Continue Albuterol inhaler 2 puffs every 6 hours as needed for shortness of breath or wheezing. Notify if symptoms persist despite rescue inhaler/neb use. Continue flonase nasal spray 2 sprays each nostril daily Continue advair 1 puff Twice daily. Brush tongue and rinse mouth afterwards Continue montelukast (singulair) 1 tab At bedtime    Follow up in 10-12 weeks with Dr. Wynona Neat or Katie Deerica Waszak,NP. If symptoms do not improve or worsen, please contact office for sooner follow up or seek emergency care.    Moderate persistent asthma Compensated on current regimen.  No recent exacerbations.  Action plan in place.  Class 3 severe obesity due to excess calories in adult (HCC) BMI 41.  Healthy weight loss encouraged.    I spent 35 minutes of dedicated to the care of this patient on the date of this encounter to include pre-visit review of records, face-to-face time with the patient discussing conditions above, post visit ordering of testing, clinical documentation with the  electronic health record, making appropriate referrals as documented, and communicating necessary findings to members of the patients care team.  Noemi [facility], NP 07/06/2023  Pt aware and understands NP's role.

## 2023-07-06 ENCOUNTER — Encounter: Payer: Self-pay | Admitting: Nurse Practitioner

## 2023-07-06 NOTE — Assessment & Plan Note (Signed)
BMI 41. Healthy weight loss encouraged

## 2023-07-06 NOTE — Assessment & Plan Note (Signed)
Compensated on current regimen. No recent exacerbations. Action plan in place.  

## 2023-07-06 NOTE — Assessment & Plan Note (Signed)
Moderate OSA with AHI 15.7.  She was supposed be started on CPAP therapy but for some reason never received this from the medical supply company.  Will replace orders today for new start auto CPAP 5-15 cmH2O. Change DME to [facility] given her current health insurance requirements.  Educated on proper use/care of device.  Risk/benefits reviewed.  Aware of safe driving practices.  Healthy weight loss encouraged.  Patient Instructions  We discussed how untreated sleep apnea puts an individual at risk for cardiac arrhthymias, pulm HTN, DM, stroke and increases their risk for daytime accidents.   Start CPAP 5-15 cmH2O with mask of choice and heated humidification  Use every night, minimum of 4-6 hours a night but goal is to wear the entire night  Change equipment as directed. Wash your tubing with warm soap and water daily, hang to dry. Wash humidifier portion weekly. Change water daily with bottled distilled water  Be aware of reduced alertness and do not drive or operate heavy machinery if experiencing this or drowsiness.  Healthy weight management discussed.  Notify if persistent daytime sleepiness occurs even with consistent use of CPAP.   Continue Albuterol inhaler 2 puffs every 6 hours as needed for shortness of breath or wheezing. Notify if symptoms persist despite rescue inhaler/neb use. Continue flonase nasal spray 2 sprays each nostril daily Continue advair 1 puff Twice daily. Brush tongue and rinse mouth afterwards Continue montelukast (singulair) 1 tab At bedtime    Follow up in 10-12 weeks with Dr. Wynona Neat or Katie Kalsey Lull,NP. If symptoms do not improve or worsen, please contact office for sooner follow up or seek emergency care.

## 2023-09-08 IMAGING — DX DG LUMBAR SPINE COMPLETE 4+V
5 series · 5 of 5 positions shown · non-contrast
Comparison: Lumbar spine radiograph 11/18/2016.

CLINICAL DATA: 70-year-old female with history of low back pain
with extension into the right hip.

EXAM:
LUMBAR SPINE - COMPLETE 4+ VIEW

[l-spine ap]
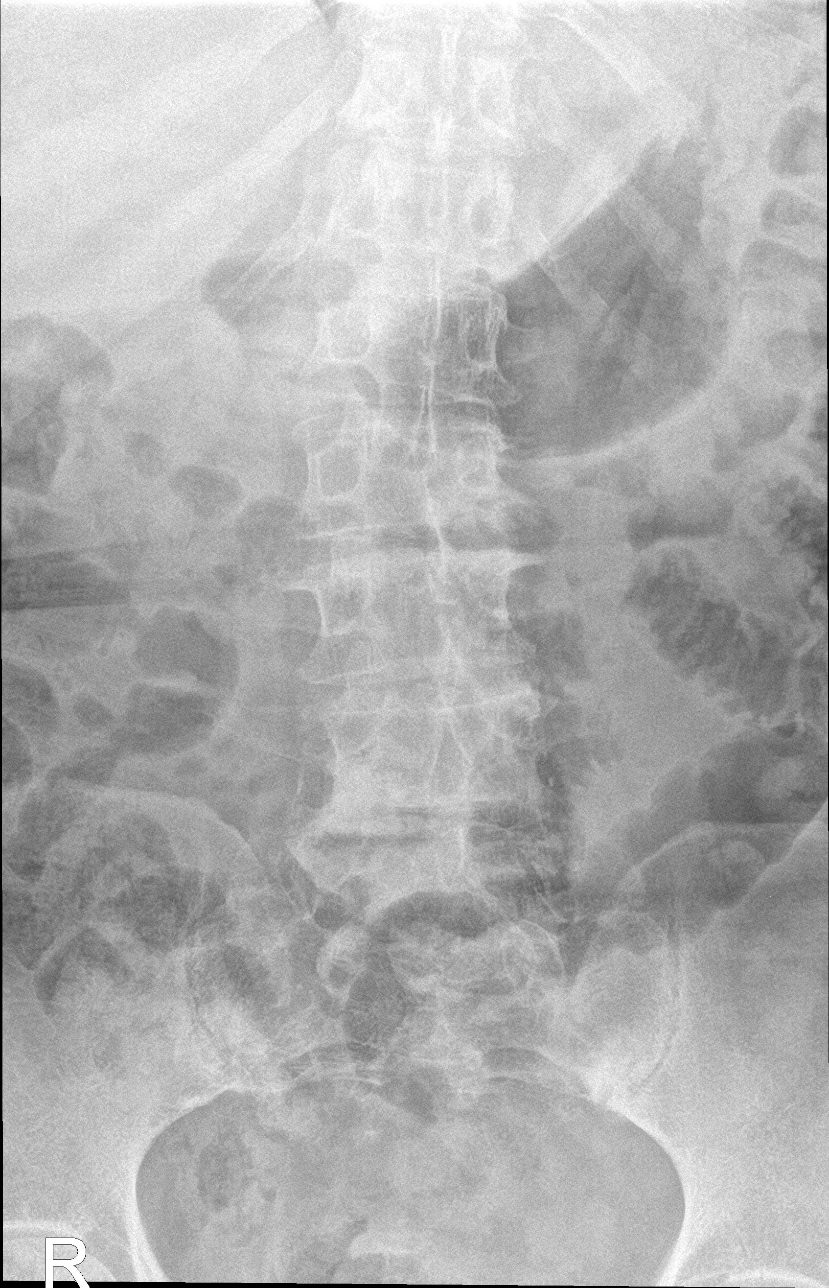

[l-spine obl (1 of 2)]
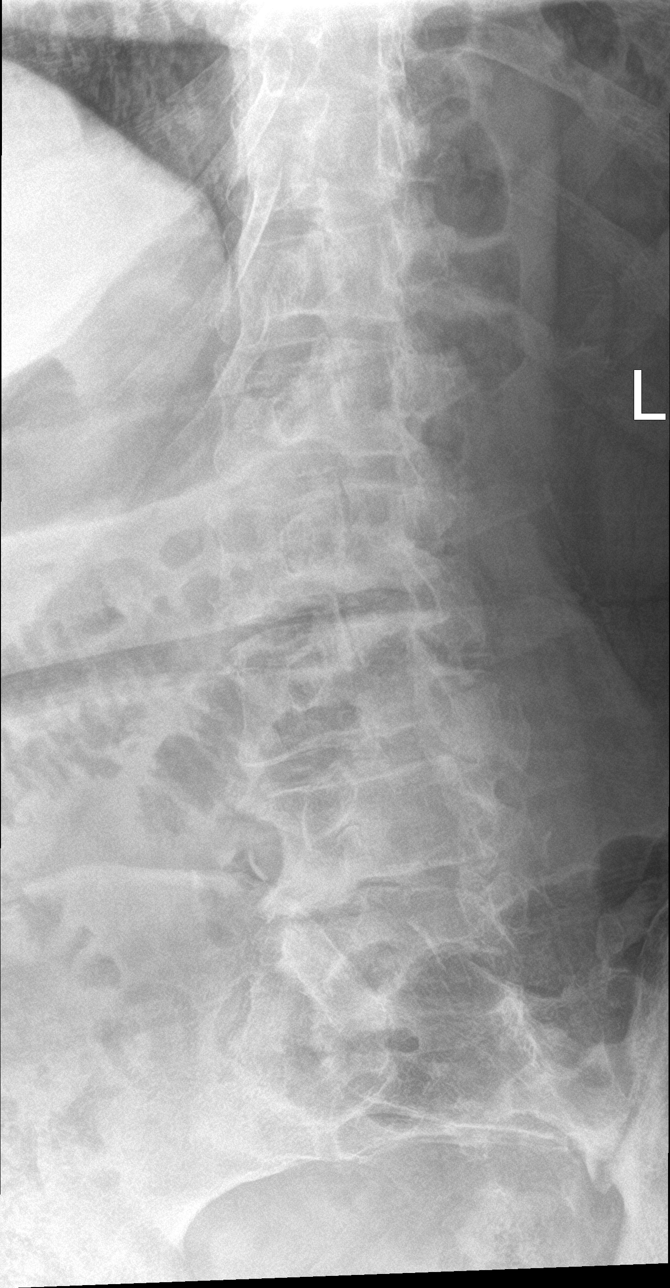

[l-spine obl (2 of 2)]
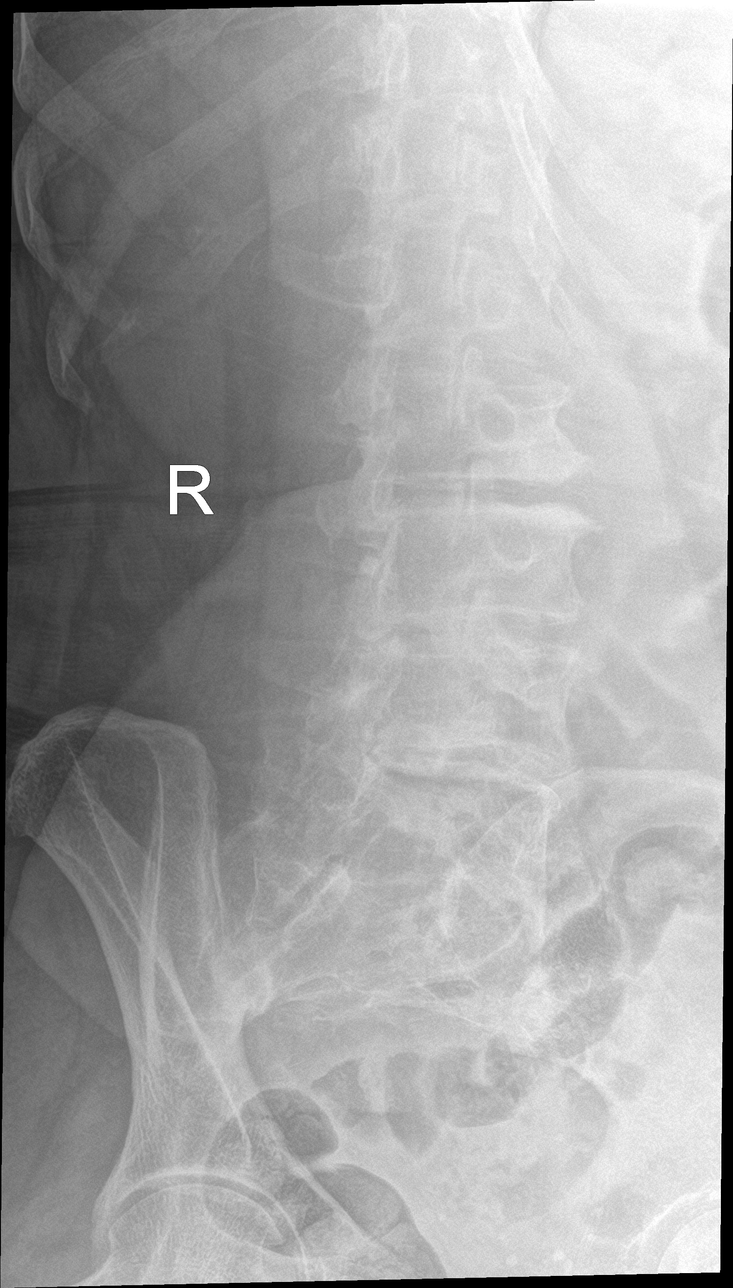

[l-spine lat (1 of 2)]
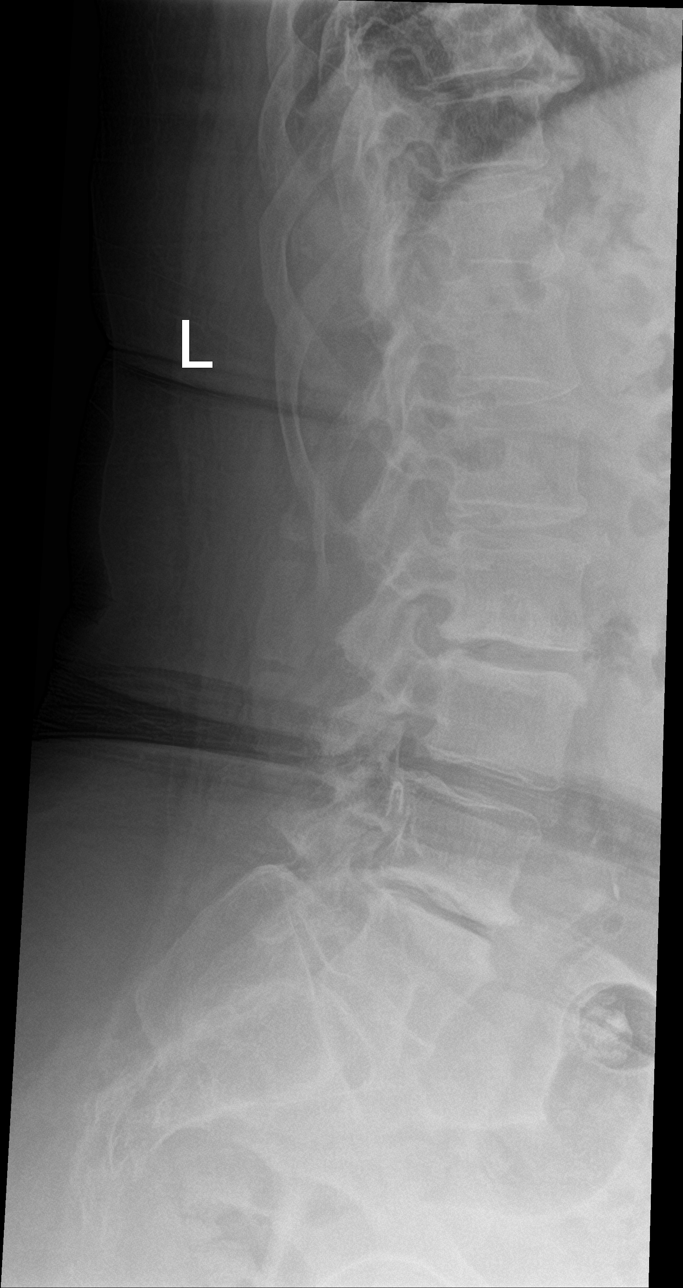

[l-spine lat (2 of 2)]
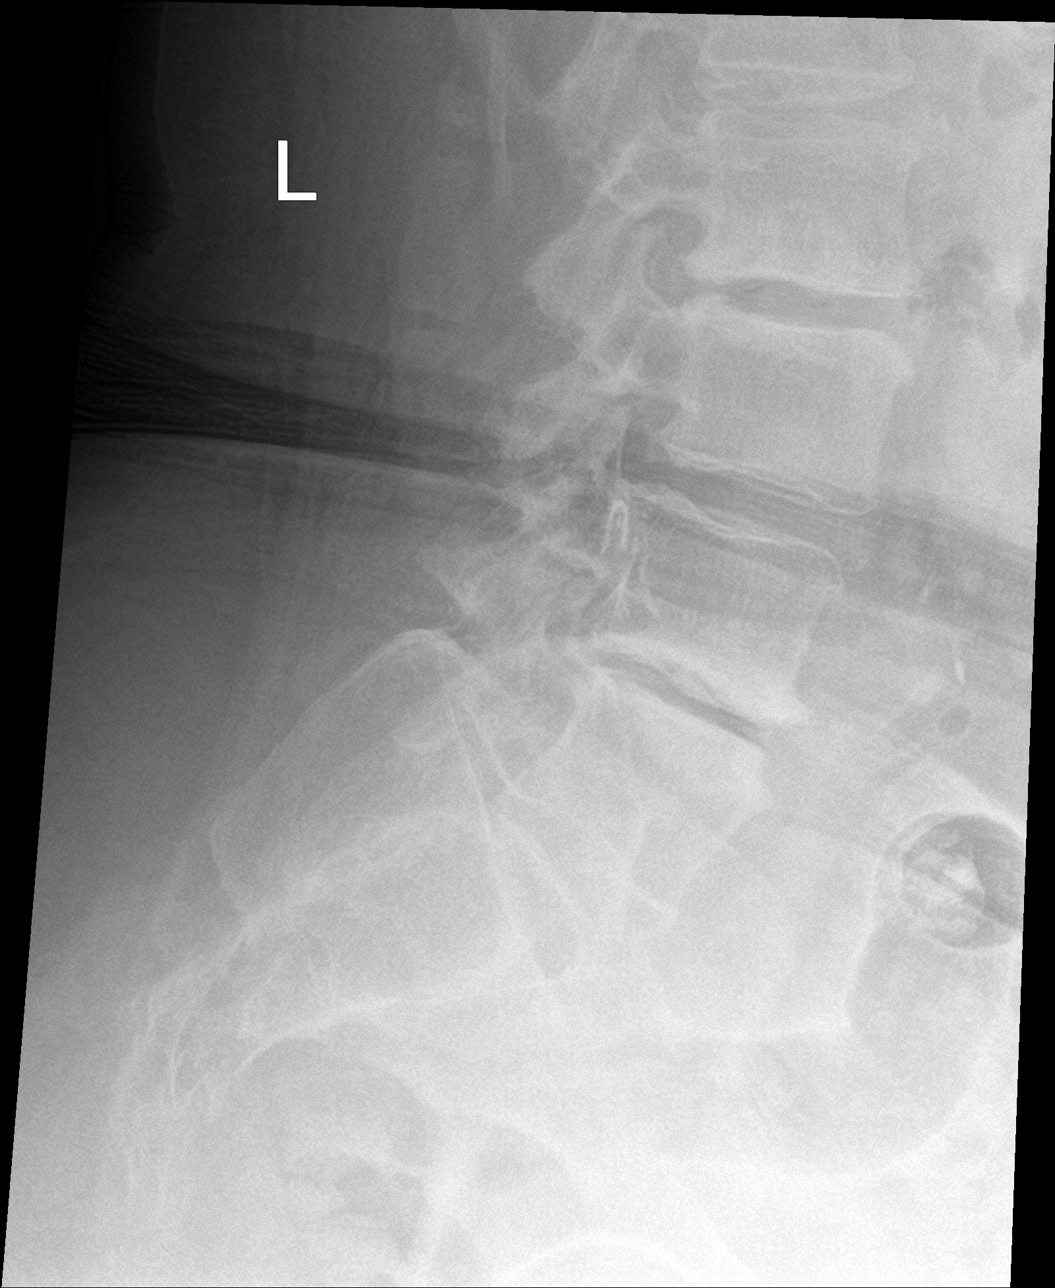

[5 of 5 positions shown; findings below may reference images not displayed]

FINDINGS: Five views of the lumbar spine demonstrate no acute displaced
fracture or definite compression type fracture. Multilevel
degenerative disc disease, most evident at L4-L5 and L5-S1.
Multilevel facet arthropathy, also most severe at L4-L5 and L5-S1.
IMPRESSION: 1. No acute radiographic abnormality of the lumbar spine.
2. Progressive multilevel degenerative disc disease and lumbar
spondylosis, as above.

## 2023-09-13 ENCOUNTER — Ambulatory Visit: Payer: Medicare Other | Admitting: Nurse Practitioner

## 2023-09-13 ENCOUNTER — Encounter: Payer: Self-pay | Admitting: Nurse Practitioner

## 2023-09-28 ENCOUNTER — Other Ambulatory Visit: Payer: Self-pay

## 2023-09-28 ENCOUNTER — Ambulatory Visit (HOSPITAL_COMMUNITY)
Admission: EM | Admit: 2023-09-28 | Discharge: 2023-09-28 | Disposition: A | Discharge status: Home / Self Care | Payer: Medicare Other | Attending: Emergency Medicine | Admitting: Emergency Medicine

## 2023-09-28 ENCOUNTER — Encounter (HOSPITAL_COMMUNITY): Payer: Self-pay | Admitting: *Deleted

## 2023-09-28 DIAGNOSIS — J0100 Acute maxillary sinusitis, unspecified: Secondary | ICD-10-CM | POA: Diagnosis not present

## 2023-09-28 MED ORDER — FLUTICASONE PROPIONATE 50 MCG/ACT NA SUSP: 1.0000 | Freq: Every day | NASAL | 2 refills | Status: AC

## 2023-09-28 MED ORDER — METHYLPREDNISOLONE ACETATE 80 MG/ML IJ SUSP
INTRAMUSCULAR | Status: AC
  Filled 2023-09-28: qty 1

## 2023-09-28 MED ORDER — METHYLPREDNISOLONE 8 MG PO TABS: 16.0000 mg | ORAL_TABLET | Freq: Every day | ORAL | 0 refills | Status: AC

## 2023-09-28 MED ORDER — CETIRIZINE HCL 5 MG PO TABS: 5.0000 mg | ORAL_TABLET | Freq: Every day | ORAL | 2 refills | Status: AC

## 2023-09-28 MED ORDER — AZITHROMYCIN 250 MG PO TABS: ORAL_TABLET | ORAL | 0 refills | Status: AC

## 2023-09-28 MED ORDER — METHYLPREDNISOLONE ACETATE 80 MG/ML IJ SUSP
60.0000 mg | Freq: Once | INTRAMUSCULAR | Status: AC
  Administered 2023-09-28: 60 mg via INTRAMUSCULAR

## 2023-09-28 NOTE — ED Provider Notes (Signed)
[facility]    CSN: [id number] Arrival date & time: 09/28/23  1402    HISTORY   Chief Complaint  Patient presents with   Facial Pain   HPI Colleen [name] is a pleasant, 72 y.o. female who presents to urgent care today. Patient complains of sinus pain that is worse at night.  Patient states she consistently cannot breathe out of 1 side of her nose but the sides alternate.  Patient states she has high blood pressure so she has not tried any over-the-counter medications to resolve her symptoms.  Patient states she has a history of allergies but is not currently taking any allergy medications.  Patient denies fever, body aches, chills, nausea, vomiting, diarrhea.  The history is provided by the patient.   Past Medical History:  Diagnosis Date   Acid reflux    Arthritis    Asthma    Chronic hepatitis B (HCC)    COPD (chronic obstructive pulmonary disease) (HCC)    Mild   GERD (gastroesophageal reflux disease)    History of iron deficiency anemia    with pregnancy   Hypercholesterolemia    Hypertension    Numbness and tingling of both upper extremities    Patient Active Problem List   Diagnosis Date Noted   Altered mental status, unspecified 03/19/2023   Moderate persistent asthma 09/15/2022   Moderate obstructive sleep apnea 09/15/2022   Class 3 severe obesity due to excess calories in adult [facility]) 09/15/2022   Hypertension    COPD (chronic obstructive pulmonary disease) (HCC)    Primary localized osteoarthritis of left knee 05/24/2019   GERD (gastroesophageal reflux disease)    Hypercholesterolemia    Past Surgical History:  Procedure Laterality Date   BACK SURGERY     BILATERAL CARPAL TUNNEL RELEASE     CERVICAL SPINE SURGERY     KNEE SURGERY Left    x2   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    Home Medications    Prior to Admission medications   Medication Sig Start Date End Date Taking? Authorizing Provider  albuterol (PROVENTIL HFA) 108 (90 Base) MCG/ACT inhaler Inhale 2 puffs into the lungs every 4 (four) hours as needed for wheezing or shortness of breath. 03/24/21  Yes Covington, Maralyn Sago M, PA-C  amLODipine (NORVASC) 10 MG tablet Take 10 mg by mouth daily.   Yes [provider]  ascorbic acid (VITAMIN C) 500 MG tablet Take 500 mg by mouth daily. 09/06/19  Yes [provider]  aspirin EC 81 MG tablet Take 81 mg by mouth once. 05/02/16  Yes [provider]  atorvastatin (LIPITOR) 40 MG tablet Take 1 tablet (40 mg total) by mouth daily. 03/21/23  Yes Osvaldo Shipper, MD  cholecalciferol (VITAMIN D3) 25 MCG (1000 UNIT) tablet Take 1,000 Units by mouth daily. 10/25/17  Yes [provider]  entecavir (BARACLUDE) 0.5 MG tablet Take 0.5 mg by mouth daily.   Yes [provider]  fluticasone (FLONASE) 50 MCG/ACT nasal spray Place 2 sprays into both nostrils daily. 03/24/21  Yes Covington, Maralyn Sago M, PA-C  fluticasone-salmeterol (ADVAIR) 250-50 MCG/ACT AEPB Inhale 1 puff into the lungs every 12 (twelve) hours. 06/08/22  Yes Coralyn Helling, MD  gabapentin (NEURONTIN) 300 MG capsule Take 1 capsule (300 mg total) by mouth 2 (two) times daily.  Patient taking differently: Take 300 mg by mouth at bedtime. 01/11/16  Yes Patel, Donika K, DO  hydrochlorothiazide (HYDRODIURIL) 25 MG tablet Take 25 mg by mouth daily. 03/08/23  Yes [provider]  hydrOXYzine (ATARAX) 25 MG tablet Take 1 tablet (25 mg total) by mouth every 6 (six) hours. 02/18/22  Yes Lamptey, Britta Mccreedy, MD  melatonin 5 MG TABS Take 1 tablet (5 mg total) by mouth at bedtime as needed (insomnia). 03/21/23  Yes Osvaldo Shipper, MD  montelukast (SINGULAIR) 10 MG tablet TAKE 1 TABLET BY MOUTH AT BEDTIME 06/05/23  Yes Coralyn Helling, MD  Multiple Vitamins-Minerals (CENTRUM  SILVER PO) Take 1 tablet by mouth daily.   Yes [provider]  omeprazole (PRILOSEC) 20 MG capsule Take 20 mg by mouth daily.  06/04/15  Yes [provider]  vitamin E 100 UNIT capsule Take 100 Units by mouth daily.   Yes [provider]  famotidine (PEPCID) 20 MG tablet Take 1 tablet (20 mg total) by mouth 2 (two) times daily for 3 days. 02/18/22 03/20/23  LampteyBritta Mccreedy, MD    Family History Family History  Problem Relation Age of Onset   Cancer Mother    Cancer Father    Social History Social History   Tobacco Use   Smoking status: Never   Smokeless tobacco: Never  Vaping Use   Vaping status: Never Used  Substance Use Topics   Alcohol use: No    Alcohol/week: 0.0 standard drinks of alcohol   Drug use: No   Allergies   Shellfish allergy, Shrimp (diagnostic), and Penicillins  Review of Systems Review of Systems Pertinent findings revealed after performing a 14 point review of systems has been noted in the history of present illness.  Physical Exam Vital Signs BP (!) 144/87   Pulse 91   Temp 98.3 F (36.8 C)   Resp 18   SpO2 96%   No data found.  Physical Exam Vitals and nursing note reviewed.  Constitutional:      General: She is not in acute distress.    Appearance: Normal appearance. She is not ill-appearing.  HENT:     Head: Normocephalic and atraumatic.     Salivary Glands: Right salivary gland is not diffusely enlarged or tender. Left salivary gland is not diffusely enlarged or tender.     Right Ear: Ear canal and external ear normal. No drainage. A middle ear effusion is present. There is no impacted cerumen. Tympanic membrane is bulging. Tympanic membrane is not injected or erythematous.     Left Ear: Ear canal and external ear normal. No drainage. A middle ear effusion is present. There is no impacted cerumen. Tympanic membrane is bulging. Tympanic membrane is not injected or erythematous.     Ears:     Comments: Bilateral  EACs normal, both TMs bulging with clear fluid    Nose: Rhinorrhea present. No nasal deformity, septal deviation, signs of injury, nasal tenderness, mucosal edema or congestion. Rhinorrhea is clear.     Right Nostril: Occlusion present. No foreign body, epistaxis or septal hematoma.     Left Nostril: Occlusion present. No foreign body, epistaxis or septal hematoma.     Right Turbinates: Enlarged, swollen and pale.     Left Turbinates: Enlarged, swollen and pale.     Right Sinus: No maxillary sinus tenderness or frontal sinus tenderness.     Left Sinus: No maxillary sinus tenderness or frontal sinus tenderness.     Mouth/Throat:  Lips: Pink. No lesions.     Mouth: Mucous membranes are moist. No oral lesions.     Pharynx: Oropharynx is clear. Uvula midline. No posterior oropharyngeal erythema or uvula swelling.     Tonsils: No tonsillar exudate. 0 on the right. 0 on the left.     Comments: Postnasal drip Eyes:     General: Lids are normal.        Right eye: No discharge.        Left eye: No discharge.     Extraocular Movements: Extraocular movements intact.     Conjunctiva/sclera: Conjunctivae normal.     Right eye: Right conjunctiva is not injected.     Left eye: Left conjunctiva is not injected.  Neck:     Trachea: Trachea and phonation normal.  Cardiovascular:     Rate and Rhythm: Normal rate and regular rhythm.     Pulses: Normal pulses.     Heart sounds: Normal heart sounds. No murmur heard.    No friction rub. No gallop.  Pulmonary:     Effort: Pulmonary effort is normal. No accessory muscle usage, prolonged expiration or respiratory distress.     Breath sounds: Normal breath sounds. No stridor, decreased air movement or transmitted upper airway sounds. No decreased breath sounds, wheezing, rhonchi or rales.  Chest:     Chest wall: No tenderness.  Musculoskeletal:        General: Normal range of motion.     Cervical back: Normal range of motion and neck supple. Normal range  of motion.  Lymphadenopathy:     Cervical: No cervical adenopathy.  Skin:    General: Skin is warm and dry.     Findings: No erythema or rash.  Neurological:     General: No focal deficit present.     Mental Status: She is alert and oriented to person, place, and time.  Psychiatric:        Mood and Affect: Mood normal.        Behavior: Behavior normal.     Visual Acuity Right Eye Distance:   Left Eye Distance:   Bilateral Distance:    Right Eye Near:   Left Eye Near:    Bilateral Near:     UC Couse / Diagnostics / Procedures:     Radiology No results found.  Procedures Procedures (including critical care time) EKG  Pending results:  Labs Reviewed - No data to display  Medications Ordered in UC: Medications  methylPREDNISolone acetate (DEPO-MEDROL) injection 60 mg (60 mg Intramuscular Given 09/28/23 1614)    UC Diagnoses / Final Clinical Impressions(s)   I have reviewed the triage vital signs and the nursing notes.  Pertinent labs & imaging results that were available during my care of the patient were reviewed by me and considered in my medical decision making (see chart for details).    Final diagnoses:  Acute maxillary sinusitis, recurrence not specified   Patient encouraged to be sure that she is taking her allergy medications every day.  Patient provided with azithromycin in consideration of the acute sinus pain and pressure she is experiencing at this time.  Patient was provided with a steroid injection during her visit for rapid reduction of inflammation and a 3-day course of methylprednisolone to keep inflammation under control.  Please see discharge instructions below for details of plan of care as provided to patient. ED Prescriptions     Medication Sig Dispense Auth. Provider   azithromycin (ZITHROMAX) 250 MG tablet Take 2  tablets (500 mg total) by mouth daily for 1 day, THEN 1 tablet (250 mg total) daily for 4 days. 6 tablet Theadora Rama Scales,  PA-C   cetirizine (ZYRTEC) 5 MG tablet Take 1 tablet (5 mg total) by mouth at bedtime. 30 tablet Theadora Rama Scales, PA-C   fluticasone (FLONASE) 50 MCG/ACT nasal spray Place 1 spray into both nostrils daily. Begin by using 2 sprays in each nare daily for 3 to 5 days, then decrease to 1 spray in each nare daily. 15.8 mL Theadora Rama Scales, PA-C   methylPREDNISolone (MEDROL) 8 MG tablet Take 2 tablets (16 mg total) by mouth daily for 3 days. 6 tablet Theadora Rama Scales, PA-C      PDMP not reviewed this encounter.  Pending results:  Labs Reviewed - No data to display    Discharge Instructions      Your symptoms and physical exam findings are concerning for a viral respiratory infection.  Because respiratory allergies are not well controlled at this time, this makes you more susceptible to catching respiratory infections.   To avoid catching frequent respiratory infections, having skin reactions, dealing with eye irritation, losing sleep, missing work, etc., due to uncontrolled allergies, it is important that you begin/continue your allergy regimen and are consistent with taking your meds exactly as prescribed.   Please read below to learn more about the medications, dosages and frequencies that I recommend to help alleviate your symptoms and to get you feeling better soon:   Z-Pak (azithromycin):  Please take two (2) tablets on day one and one tablet daily thereafter until the prescription is complete.This antibiotic can cause upset stomach, this will resolve once antibiotics are complete.  You are welcome to take a probiotic, eat yogurt, take Imodium while taking this medication.  Please avoid other systemic medications such as Maalox, Pepto-Bismol or milk of magnesia as they can interfere with the body's ability to absorb the antibiotics.       Depo-Medrol IM (methylprednisolone):  To quickly address your significant respiratory inflammation, you were provided with an injection  of Solu-Medrol in the office today.  You should continue to feel the full benefit of the steroid for the next 8 to 12 hours.    Zyrtec (cetirizine): This is an excellent second-generation antihistamine that helps to reduce respiratory inflammatory response to environmental allergens.  In some patients, this medication can cause daytime sleepiness so I recommend that you take 1 tablet daily at bedtime.     Flonase (fluticasone): This is a steroid nasal spray that used once daily, 1 spray in each nare.  This works best when used on a daily basis. This medication does not work well if it is only used when you think you need it.  After 3 to 5 days of use, you will notice significant reduction of the inflammation and mucus production that is currently being caused by exposure to allergens, whether seasonal or environmental.  The most common side effect of this medication is nosebleeds.  If you experience a nosebleed, please discontinue use for 1 week, then feel free to resume.  If you find that your insurance will not pay for this medication, please consider a different nasal steroids such as Nasonex (mometasone), or Nasacort (triamcinolone).   If symptoms have not meaningfully improved in the next 5 to 7 days, please return for repeat evaluation or follow-up with your pediatrician.  If symptoms have worsened in the next 3 to 5 days, please return for repeat evaluation or  follow-up with your pediatrician.   Thank you for visiting urgent care today.  We appreciate the opportunity to participate in your child's care.      Disposition Upon Discharge:  Condition: stable for discharge home  Patient presented with an acute illness with associated systemic symptoms and significant discomfort requiring urgent management. In my opinion, this is a condition that a prudent lay person (someone who possesses an average knowledge of health and medicine) may potentially expect to result in complications if not  addressed urgently such as respiratory distress, impairment of bodily function or dysfunction of bodily organs.   Routine symptom specific, illness specific and/or disease specific instructions were discussed with the patient and/or caregiver at length.   As such, the patient has been evaluated and assessed, work-up was performed and treatment was provided in alignment with urgent care protocols and evidence based medicine.  Patient/parent/caregiver has been advised that the patient may require follow up for further testing and treatment if the symptoms continue in spite of treatment, as clinically indicated and appropriate.  Patient/parent/caregiver has been advised to return to the [facility] or PCP if no better; to PCP or the Emergency Department if new signs and symptoms develop, or if the current signs or symptoms continue to change or worsen for further workup, evaluation and treatment as clinically indicated and appropriate  The patient will follow up with their current PCP if and as advised. If the patient does not currently have a PCP we will assist them in obtaining one.   The patient may need specialty follow up if the symptoms continue, in spite of conservative treatment and management, for further workup, evaluation, consultation and treatment as clinically indicated and appropriate.  Patient/parent/caregiver verbalized understanding and agreement of plan as discussed.  All questions were addressed during visit.  Please see discharge instructions below for further details of plan.  This office note has been dictated using Teaching laboratory technician.  Unfortunately, this method of dictation can sometimes lead to typographical or grammatical errors.  I apologize for your inconvenience in advance if this occurs.  Please do not hesitate to reach out to me if clarification is needed.      Theadora Rama Scales, PA-C 09/28/23 1625

## 2023-09-28 NOTE — ED Triage Notes (Signed)
Pt reports sinus pain worse at night. Pt can not take OTC because she takes BP meds.

## 2023-09-28 NOTE — Discharge Instructions (Addendum)
Your symptoms and physical exam findings are concerning for a viral respiratory infection.  Because respiratory allergies are not well controlled at this time, this makes you more susceptible to catching respiratory infections.   To avoid catching frequent respiratory infections, having skin reactions, dealing with eye irritation, losing sleep, missing work, etc., due to uncontrolled allergies, it is important that you begin/continue your allergy regimen and are consistent with taking your meds exactly as prescribed.   Please read below to learn more about the medications, dosages and frequencies that I recommend to help alleviate your symptoms and to get you feeling better soon:   Z-Pak (azithromycin):  Please take two (2) tablets on day one and one tablet daily thereafter until the prescription is complete.This antibiotic can cause upset stomach, this will resolve once antibiotics are complete.  You are welcome to take a probiotic, eat yogurt, take Imodium while taking this medication.  Please avoid other systemic medications such as Maalox, Pepto-Bismol or milk of magnesia as they can interfere with the body's ability to absorb the antibiotics.       Depo-Medrol IM (methylprednisolone):  To quickly address your significant respiratory inflammation, you were provided with an injection of Solu-Medrol in the office today.  You should continue to feel the full benefit of the steroid for the next 8 to 12 hours.    Zyrtec (cetirizine): This is an excellent second-generation antihistamine that helps to reduce respiratory inflammatory response to environmental allergens.  In some patients, this medication can cause daytime sleepiness so I recommend that you take 1 tablet daily at bedtime.     Flonase (fluticasone): This is a steroid nasal spray that used once daily, 1 spray in each nare.  This works best when used on a daily basis. This medication does not work well if it is only used when you think you need  it.  After 3 to 5 days of use, you will notice significant reduction of the inflammation and mucus production that is currently being caused by exposure to allergens, whether seasonal or environmental.  The most common side effect of this medication is nosebleeds.  If you experience a nosebleed, please discontinue use for 1 week, then feel free to resume.  If you find that your insurance will not pay for this medication, please consider a different nasal steroids such as Nasonex (mometasone), or Nasacort (triamcinolone).   If symptoms have not meaningfully improved in the next 5 to 7 days, please return for repeat evaluation or follow-up with your pediatrician.  If symptoms have worsened in the next 3 to 5 days, please return for repeat evaluation or follow-up with your pediatrician.   Thank you for visiting urgent care today.  We appreciate the opportunity to participate in your child's care.

## 2023-11-16 ENCOUNTER — Encounter (HOSPITAL_COMMUNITY): Payer: Self-pay

## 2023-11-16 ENCOUNTER — Ambulatory Visit (HOSPITAL_COMMUNITY)
Admission: EM | Admit: 2023-11-16 | Discharge: 2023-11-16 | Disposition: A | Discharge status: Home / Self Care | Payer: 59 | Attending: Family Medicine | Admitting: Family Medicine

## 2023-11-16 ENCOUNTER — Ambulatory Visit (INDEPENDENT_AMBULATORY_CARE_PROVIDER_SITE_OTHER): Payer: 59

## 2023-11-16 DIAGNOSIS — M79641 Pain in right hand: Secondary | ICD-10-CM

## 2023-11-16 MED ORDER — PREDNISONE 20 MG PO TABS: 40.0000 mg | ORAL_TABLET | Freq: Every day | ORAL | 0 refills | Status: AC

## 2023-11-16 MED ORDER — KETOROLAC TROMETHAMINE 30 MG/ML IJ SOLN
30.0000 mg | Freq: Once | INTRAMUSCULAR | Status: AC
  Administered 2023-11-16: 30 mg via INTRAMUSCULAR

## 2023-11-16 MED ORDER — KETOROLAC TROMETHAMINE 30 MG/ML IJ SOLN
INTRAMUSCULAR | Status: AC
  Filled 2023-11-16: qty 1

## 2023-11-16 NOTE — ED Triage Notes (Signed)
 Right hand swelling onset this morning. No known falls or injuries. History of surgery on this same hand 30 years ago.

## 2023-11-16 NOTE — Discharge Instructions (Addendum)
 You were seen today for hand pain.  Your xray was normal.  I have given you a shot of a pain medication while here today.  I have sent out 5 days of a steroid to see if helpful for the pain and swelling.  If not improving, then please return or be seen by your primary care provider.

## 2023-11-16 NOTE — ED Provider Notes (Signed)
 [facility]    CSN: [id number] Arrival date & time: 11/16/23  1006      History   Chief Complaint Chief Complaint  Patient presents with   Hand Pain    HPI Colleen [name] is a 73 y.o. female.    Hand Pain  Patient is here for right hand pain.  Started this morning when she woke.   Pain is mostly at the 3rd/4th MCP joint.  Painful to bend the fingers.  No known injury.  Took advil /arthritis pill this morning.        Past Medical History:  Diagnosis Date   Acid reflux    Arthritis    Asthma    Chronic hepatitis B (HCC)    COPD (chronic obstructive pulmonary disease) (HCC)    Mild   GERD (gastroesophageal reflux disease)    History of iron deficiency anemia    with pregnancy   Hypercholesterolemia    Hypertension    Numbness and tingling of both upper extremities     Patient Active Problem List   Diagnosis Date Noted   Altered mental status, unspecified 03/19/2023   Moderate persistent asthma 09/15/2022   Moderate obstructive sleep apnea 09/15/2022   Class 3 severe obesity due to excess calories in adult [facility]) 09/15/2022   Hypertension    COPD (chronic obstructive pulmonary disease) (HCC)    Primary localized osteoarthritis of left knee 05/24/2019   GERD (gastroesophageal reflux disease)    Hypercholesterolemia     Past Surgical History:  Procedure Laterality Date   BACK SURGERY     BILATERAL CARPAL TUNNEL RELEASE     CERVICAL SPINE SURGERY     KNEE SURGERY Left    x2   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      Home Medications    Prior to Admission medications   Medication Sig Start Date End Date Taking? Authorizing Provider  albuterol  (PROVENTIL  HFA) 108 (90 Base) MCG/ACT inhaler Inhale 2 puffs into the lungs every 4 (four) hours as needed for  wheezing or shortness of breath. 03/24/21  Yes Covington, Sarah M, PA-C  amLODipine  (NORVASC ) 10 MG tablet Take 10 mg by mouth daily.   Yes [provider]  ascorbic acid (VITAMIN C) 500 MG tablet Take 500 mg by mouth daily. 09/06/19  Yes [provider]  aspirin  EC 81 MG tablet Take 81 mg by mouth once. 05/02/16  Yes [provider]  atorvastatin  (LIPITOR) 40 MG tablet Take 1 tablet (40 mg total) by mouth daily. 03/21/23  Yes Krishnan, Gokul, MD  cetirizine  (ZYRTEC ) 5 MG tablet Take 1 tablet (5 mg total) by mouth at bedtime. 09/28/23 12/27/23 Yes Joesph Shaver Scales, PA-C  cholecalciferol (VITAMIN D3) 25 MCG (1000 UNIT) tablet Take 1,000 Units by mouth daily. 10/25/17  Yes [provider]  entecavir  (BARACLUDE ) 0.5 MG tablet Take 0.5 mg by mouth daily.   Yes [provider]  fluticasone  (FLONASE ) 50 MCG/ACT nasal spray Place 1 spray into both nostrils daily. Begin by using 2 sprays in each nare daily for 3 to 5 days, then decrease to 1 spray in each nare daily. 09/28/23  Yes Joesph Shaver Scales, PA-C  fluticasone -salmeterol (ADVAIR) 250-50 MCG/ACT AEPB Inhale 1 puff into the lungs every 12 (twelve) hours. 06/08/22  Yes Shellia Oh, MD  gabapentin  (NEURONTIN ) 300 MG capsule Take 1 capsule (300 mg total) by mouth 2 (two) times daily. Patient taking differently: Take 300 mg by mouth at bedtime. 01/11/16  Yes Patel, Donika K, DO  hydrochlorothiazide (HYDRODIURIL) 25 MG tablet Take 25 mg by mouth daily. 03/08/23  Yes [provider]  hydrOXYzine  (ATARAX ) 25 MG tablet Take 1 tablet (25 mg total) by mouth every 6 (six) hours. 02/18/22  Yes Lamptey, Aleene KIDD, MD  melatonin 5 MG TABS Take 1 tablet (5 mg total) by mouth at bedtime as needed (insomnia). 03/21/23  Yes Verdene Purchase, MD  montelukast  (SINGULAIR ) 10 MG tablet TAKE 1 TABLET BY MOUTH AT BEDTIME 06/05/23  Yes Sood, Vineet, MD  Multiple Vitamins-Minerals (CENTRUM SILVER PO) Take 1 tablet by mouth  daily.   Yes [provider]  omeprazole (PRILOSEC) 20 MG capsule Take 20 mg by mouth daily.  06/04/15  Yes [provider]  vitamin E  100 UNIT capsule Take 100 Units by mouth daily.   Yes [provider]  famotidine  (PEPCID ) 20 MG tablet Take 1 tablet (20 mg total) by mouth 2 (two) times daily for 3 days. 02/18/22 03/20/23  LampteyAleene KIDD, MD    Family History Family History  Problem Relation Age of Onset   Cancer Mother    Cancer Father     Social History Social History   Tobacco Use   Smoking status: Never   Smokeless tobacco: Never  Vaping Use   Vaping status: Never Used  Substance Use Topics   Alcohol use: No    Alcohol/week: 0.0 standard drinks of alcohol   Drug use: No     Allergies   Shellfish allergy, Shrimp (diagnostic), and Penicillins   Review of Systems Review of Systems  Constitutional: Negative.   HENT: Negative.    Respiratory: Negative.    Cardiovascular: Negative.   Gastrointestinal: Negative.   Musculoskeletal:  Positive for arthralgias and joint swelling.     Physical Exam Triage Vital Signs ED Triage Vitals  Encounter Vitals Group     BP 11/16/23 1312 (!) 153/95     Systolic BP Percentile --      Diastolic BP Percentile --      Pulse Rate 11/16/23 1312 86     Resp 11/16/23 1312 18     Temp --      Temp src --      SpO2 11/16/23 1312 98 %     Weight 11/16/23 1311 219 lb 5.7 oz (99.5 kg)     Height 11/16/23 1311 5' 7 (1.702 m)     Head Circumference --      Peak Flow --      Pain Score --      Pain Loc --      Pain Education --      Exclude from Growth Chart --    No data found.  Updated Vital Signs BP (!) 153/95 (BP Location: Right Arm)   Pulse 86   Resp 18   Ht 5' 7 (1.702 m)   Wt 99.5 kg   SpO2 98%   BMI 34.36 kg/m   Visual Acuity Right Eye Distance:   Left Eye Distance:   Bilateral Distance:    Right Eye Near:   Left Eye Near:    Bilateral Near:     Physical Exam Constitutional:       Appearance: Normal appearance. She is normal weight.  Cardiovascular:     Rate and Rhythm: Normal rate and regular  rhythm.  Pulmonary:     Effort: Pulmonary effort is normal.     Breath sounds: Normal breath sounds.  Musculoskeletal:     Comments: There is slight swelling to the back of the right hand, at the 2nd-4th mcp joint.  She has TTP to the 3rd and 4th MCP joint;  pain with movement of the finger;  no redness/warmth noted;  no abrasion to the skin  Skin:    General: Skin is warm.  Neurological:     General: No focal deficit present.     Mental Status: She is alert.  Psychiatric:        Mood and Affect: Mood normal.      UC Treatments / Results  Labs (all labs ordered are listed, but only abnormal results are displayed) Labs Reviewed - No data to display  EKG   Radiology DG Hand Complete Right Result Date: 11/16/2023 CLINICAL DATA:  Pain at the right third and fourth metacarpophalangeal joints. Right hand swelling starting this morning. No known injury. History of right hand surgery 30 years ago. EXAM: RIGHT HAND - COMPLETE 3+ VIEW COMPARISON:  None Available. FINDINGS: There is diffuse decreased bone mineralization. Degenerative changes including joint space narrowing, subchondral sclerosis, and peripheral osteophytosis are moderate to severe at the thumb carpometacarpal joint, moderate at the thumb metacarpophalangeal joint, moderate at the second and third DIP joints, mild at the fourth and fifth DIP and fourth PIP joints. Mild fifth metacarpophalangeal and triscaphe joint space narrowing and peripheral osteophytosis. No acute fracture or dislocation. 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 No significant abnormality is seen of the third or fourth metacarpophalangeal joint. Electronically Signed   By: Tanda Lyons M.D.   On: 11/16/2023 14:25    Procedures Procedures (including critical care time)  Medications Ordered in  UC Medications  ketorolac  (TORADOL ) 30 MG/ML injection 30 mg (30 mg Intramuscular Given 11/16/23 1436)    Initial Impression / Assessment and Plan / UC Course  I have reviewed the triage vital signs and the nursing notes.  Pertinent labs & imaging results that were available during my care of the patient were reviewed by me and considered in my medical decision making (see chart for details).    Final Clinical Impressions(s) / UC Diagnoses   Final diagnoses:  Pain of right hand     Discharge Instructions      You were seen today for hand pain.  Your xray was normal.  I have given you a shot of a pain medication while here today.  I have sent out 5 days of a steroid to see if helpful for the pain and swelling.  If not improving, then please return or be seen by your primary care provider.     ED Prescriptions     Medication Sig Dispense Auth. Provider   predniSONE  (DELTASONE ) 20 MG tablet Take 2 tablets (40 mg total) by mouth daily for 5 days. 10 tablet Darral Longs, MD      PDMP not reviewed this encounter.   Darral Longs, MD 11/16/23 [phone]

## 2024-03-03 ENCOUNTER — Ambulatory Visit (HOSPITAL_COMMUNITY): Admission: EM | Admit: 2024-03-03 | Discharge: 2024-03-03 | Disposition: A | Discharge status: Home / Self Care

## 2024-03-03 ENCOUNTER — Encounter (HOSPITAL_COMMUNITY): Payer: Self-pay

## 2024-03-03 DIAGNOSIS — J069 Acute upper respiratory infection, unspecified: Secondary | ICD-10-CM

## 2024-03-03 MED ORDER — AZELASTINE HCL 0.1 % NA SOLN: 1.0000 | Freq: Two times a day (BID) | NASAL | 1 refills | Status: AC

## 2024-03-03 MED ORDER — PREDNISONE 20 MG PO TABS: 40.0000 mg | ORAL_TABLET | Freq: Every day | ORAL | 0 refills | Status: DC

## 2024-03-03 MED ORDER — BENZONATATE 200 MG PO CAPS: 200.0000 mg | ORAL_CAPSULE | Freq: Three times a day (TID) | ORAL | 0 refills | Status: DC | PRN

## 2024-03-03 NOTE — ED Provider Notes (Signed)
 [facility] [place]  Note:  This document was prepared using Dragon voice recognition software and may include unintentional dictation errors.  MRN: [id number] DOB: 1951-01-14  Subjective:   Colleen [name] is a 73 y.o. female presenting for nasal congestion and cough x 2 days.  Patient reports she initially had some sore throat but has been taking cough drops and seems to have improved her sore throat.  Patient denies any fever, shortness of breath, chest pain, wheezing, weakness, dizziness.  Patient reports history of COPD and high blood pressure.  Patient has been taking over-the-counter Claritin for what she thought was nasal congestion secondary to allergies but symptoms have not improved with daily antihistamine.  Patient denies taking any other over-the-counter medication to treat symptoms.  Patient denies any known sick exposures.  Patient offered COVID testing but states that she is not concerned for COVID-19.  No current facility-administered medications for this encounter.  Current Outpatient Medications:    azelastine (ASTELIN) 0.1 % nasal spray, Place 1 spray into both nostrils 2 (two) times daily. Use in each nostril as directed, Disp: 30 mL, Rfl: 1   benzonatate  (TESSALON ) 200 MG capsule, Take 1 capsule (200 mg total) by mouth 3 (three) times daily as needed for cough., Disp: 20 capsule, Rfl: 0   predniSONE  (DELTASONE ) 20 MG tablet, Take 2 tablets (40 mg total) by mouth daily for 5 days., Disp: 10 tablet, Rfl: 0   albuterol  (PROVENTIL  HFA) 108 (90 Base) MCG/ACT inhaler, Inhale 2 puffs into the lungs every 4 (four) hours as needed for wheezing or shortness of breath., Disp: 1 each, Rfl: 0   amLODipine  (NORVASC ) 10 MG tablet, Take 10 mg by mouth daily., Disp: , Rfl:    ascorbic acid (VITAMIN C) 500 MG tablet, Take 500 mg by mouth daily., Disp: , Rfl:    aspirin  EC 81 MG tablet, Take 81 mg by mouth once., Disp: , Rfl:    atorvastatin  (LIPITOR) 40 MG tablet, Take 1 tablet (40  mg total) by mouth daily., Disp: 30 tablet, Rfl: 2   cetirizine  (ZYRTEC ) 5 MG tablet, Take 1 tablet (5 mg total) by mouth at bedtime., Disp: 30 tablet, Rfl: 2   cholecalciferol (VITAMIN D3) 25 MCG (1000 UNIT) tablet, Take 1,000 Units by mouth daily., Disp: , Rfl:    entecavir  (BARACLUDE ) 0.5 MG tablet, Take 0.5 mg by mouth daily., Disp: , Rfl:    famotidine  (PEPCID ) 20 MG tablet, Take 1 tablet (20 mg total) by mouth 2 (two) times daily for 3 days., Disp: 6 tablet, Rfl: 0   fluticasone  (FLONASE ) 50 MCG/ACT nasal spray, Place 1 spray into both nostrils daily. Begin by using 2 sprays in each nare daily for 3 to 5 days, then decrease to 1 spray in each nare daily., Disp: 15.8 mL, Rfl: 2   fluticasone -salmeterol (ADVAIR) 250-50 MCG/ACT AEPB, Inhale 1 puff into the lungs every 12 (twelve) hours., Disp: 60 each, Rfl: 11   gabapentin  (NEURONTIN ) 300 MG capsule, Take 1 capsule (300 mg total) by mouth 2 (two) times daily. (Patient taking differently: Take 300 mg by mouth at bedtime.), Disp: 60 capsule, Rfl: 5   hydrochlorothiazide (HYDRODIURIL) 25 MG tablet, Take 25 mg by mouth daily., Disp: , Rfl:    hydrOXYzine  (ATARAX ) 25 MG tablet, Take 1 tablet (25 mg total) by mouth every 6 (six) hours., Disp: 12 tablet, Rfl: 0   melatonin 5 MG TABS, Take 1 tablet (5 mg total) by mouth at bedtime as needed (insomnia)., Disp: 15 tablet,  Rfl: 0   montelukast  (SINGULAIR ) 10 MG tablet, TAKE 1 TABLET BY MOUTH AT BEDTIME, Disp: 30 tablet, Rfl: 3   Multiple Vitamins-Minerals (CENTRUM SILVER PO), Take 1 tablet by mouth daily., Disp: , Rfl:    omeprazole (PRILOSEC) 20 MG capsule, Take 20 mg by mouth daily. , Disp: , Rfl:    vitamin E  100 UNIT capsule, Take 100 Units by mouth daily., Disp: , Rfl:    Allergies  Allergen Reactions   Shellfish Allergy Shortness Of Breath and Swelling   Shrimp (Diagnostic) Anaphylaxis   Penicillins Hives, Swelling and Other (See Comments)    Did it involve swelling of the face/tongue/throat, SOB,  or low BP? Yes Did it involve sudden or severe rash/hives, skin peeling, or any reaction on the inside of your mouth or nose? Yes Did you need to seek medical attention at a hospital or doctor's office? Yes When did it last happen? Late 20's If all above answers are "NO", may proceed with cephalosporin use.     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  Objective:   Vitals: BP (!) 150/91 (BP Location: Right Arm)   Pulse 74   Temp 99.1 F (37.3 C) (Oral)   Resp 16   SpO2 95%   Physical Exam Vitals and nursing note reviewed.  Constitutional:      General: She is not in acute distress.    Appearance: Normal appearance. She is well-developed. She is not ill-appearing or toxic-appearing.  HENT:     Head: Normocephalic.     Nose: Mucosal edema, congestion and rhinorrhea present. No nasal tenderness.     Right Turbinates: Swollen.     Left Turbinates: Swollen.     Mouth/Throat:      Mouth: Mucous membranes are moist.     Pharynx: Oropharynx is clear. Postnasal drip present. No oropharyngeal exudate or posterior oropharyngeal erythema.  Eyes:     General:        Right eye: No discharge.        Left eye: No discharge.     Extraocular Movements: Extraocular movements intact.     Conjunctiva/sclera: Conjunctivae normal.  Cardiovascular:     Rate and Rhythm: Normal rate and regular rhythm.  Heart sounds: No murmur heard. Pulmonary:     Effort: Pulmonary effort is normal. No respiratory distress.     Breath sounds: Normal breath sounds. No stridor. No wheezing, rhonchi or rales.  Chest:     Chest wall: No tenderness.  Skin:    General: Skin is warm and dry.  Neurological:     General: No focal deficit present.     Mental Status: She is alert and oriented to person, place, and time.  Psychiatric:        Mood and Affect: Mood normal.        Behavior: Behavior normal.     Procedures  No results found for this or any previous visit (from the past 24 hours).  No results found.   Assessment and Plan :     Discharge Instructions       1. Viral URI with cough (Primary) - azelastine (ASTELIN) 0.1 % nasal spray; Place 1 spray into both nostrils 2 (two) times daily. Use in each nostril as directed  Dispense: 30 mL; Refill: 1 - benzonatate  (TESSALON ) 200 MG capsule; Take 1 capsule (200 mg total) by mouth 3 (three) times daily as needed for cough.  Dispense: 20 capsule; Refill: 0 - predniSONE  (DELTASONE ) 20 MG tablet; Take 2 tablets (40 mg total) by mouth daily for 5 days if you develop any wheezing or dyspnea with current symptoms. -Continue taking daily Claritin 10 mg for nasal allergy symptoms. -Continue to monitor symptoms for any change in severity if there is any escalation of current symptoms or development of new symptoms follow-up in ER for further evaluation and management.    Collan Schoenfeld B Geisha Abernathy   Jaedan Schuman B, [place] 03/03/24 1039

## 2024-03-03 NOTE — Discharge Instructions (Addendum)
  1. Viral URI with cough (Primary) - azelastine (ASTELIN) 0.1 % nasal spray; Place 1 spray into both nostrils 2 (two) times daily. Use in each nostril as directed  Dispense: 30 mL; Refill: 1 - benzonatate  (TESSALON ) 200 MG capsule; Take 1 capsule (200 mg total) by mouth 3 (three) times daily as needed for cough.  Dispense: 20 capsule; Refill: 0 - predniSONE  (DELTASONE ) 20 MG tablet; Take 2 tablets (40 mg total) by mouth daily for 5 days if you develop any wheezing or dyspnea with current symptoms. -Continue taking daily Claritin 10 mg for nasal allergy symptoms. -Continue to monitor symptoms for any change in severity if there is any escalation of current symptoms or development of new symptoms follow-up in ER for further evaluation and management.

## 2024-03-03 NOTE — ED Triage Notes (Signed)
 Patient here today with c/o nasal congestion and cough X 2 days. She has tried taking Claritin with no relief.

## 2024-03-06 ENCOUNTER — Ambulatory Visit: Admitting: Nurse Practitioner

## 2024-03-06 ENCOUNTER — Encounter: Payer: Self-pay | Admitting: Nurse Practitioner

## 2024-03-06 VITALS — BP 132/90 | HR 104 | Ht 62.0 in | Wt 221.4 lb

## 2024-03-06 DIAGNOSIS — G4733 Obstructive sleep apnea (adult) (pediatric): Secondary | ICD-10-CM

## 2024-03-06 DIAGNOSIS — J309 Allergic rhinitis, unspecified: Secondary | ICD-10-CM | POA: Diagnosis not present

## 2024-03-06 DIAGNOSIS — J4541 Moderate persistent asthma with (acute) exacerbation: Secondary | ICD-10-CM

## 2024-03-06 DIAGNOSIS — R059 Cough, unspecified: Secondary | ICD-10-CM

## 2024-03-06 MED ORDER — AZITHROMYCIN 250 MG PO TABS: ORAL_TABLET | ORAL | 0 refills | Status: DC

## 2024-03-06 MED ORDER — IPRATROPIUM-ALBUTEROL 0.5-2.5 (3) MG/3ML IN SOLN
3.0000 mL | Freq: Once | RESPIRATORY_TRACT | Status: AC
  Administered 2024-03-06: 3 mL via RESPIRATORY_TRACT

## 2024-03-06 MED ORDER — METHYLPREDNISOLONE ACETATE 80 MG/ML IJ SUSP
80.0000 mg | Freq: Once | INTRAMUSCULAR | Status: AC
  Administered 2024-03-06: 80 mg via INTRAMUSCULAR

## 2024-03-06 MED ORDER — PROMETHAZINE-DM 6.25-15 MG/5ML PO SYRP: 5.0000 mL | ORAL_SOLUTION | Freq: Four times a day (QID) | ORAL | 0 refills | Status: DC | PRN

## 2024-03-06 MED ORDER — PREDNISONE 10 MG PO TABS: ORAL_TABLET | ORAL | 0 refills | Status: DC

## 2024-03-06 NOTE — Progress Notes (Signed)
 @Patient  ID: [facility], female    DOB: 12-26-1950, 73 y.o.   MRN: [id number]  Chief Complaint  Patient presents with   Follow-up    Patient states she having allergies.    Referring provider: No ref. provider found  HPI: 73 year old female, never smoker followed for asthma, allergic rhinitis and mild OSA.  She is a former patient of Dr. Carlyle Childes and last seen in office 07/05/2023.  Past medical history significant for GERD, arthritis, chronic hepatitis B with cirrhosis, HLD, hypertension.  TEST/EVENTS:  07/20/2022 PFT: FVC 51, FEV1 58, ratio 89, TLC 80, DLCOcor 112 08/17/2022 HST: AHI 15.7/h, SpO2 low 74%, average 94%  08/02/2022: OV with Dr. Matilde Son. CXR from August nl. IgE 207, eos 0.1. PFT mixed obstruction and restriction. Sinus congestion and postnasal drip. Gets itchy/watery eyes when allergies flare. Still having trouble with sleep and loud snoring. HST previously ordered - will f/u on this. Advised to trial nasal irrigations daily and OTC olopatadine  eye drops. Continued advair, singulair , flonase   09/15/2022: OV with Stehanie Ekstrom NP for follow-up with her daughter to discuss home sleep study results.  She had HST on 11/8 which revealed moderate obstructive sleep apnea with AHI of 15.7.  She continues to have trouble with her sleep and snores mildly at night.  She also feels like sometimes when she is driving she forgets where she is going.  She feels very tired throughout the day.  Denies any sleep parasomnia/paralysis, drowsy driving.  No history of narcolepsy or cataplexy.  Her breathing is overall stable.  She continues on Advair and Singulair .  She also uses Flonase  and occasionally uses nasal irrigations for her allergies, which seem to be well-controlled right now.  07/05/2023: OV with Arlee Bossard NP for overdue follow up.  After her last visit, she was ordered a CPAP machine but never received this.  She had called in January regarding this and [employer] had said that they were going to run it  after the first of the year.  She never heard anything else.  Her daughter passed away 2 months ago so she has not been as focused on her health.  She knows that she needs to go ahead and get started on therapy and so she wanted to come back and see us .  She still having trouble with sleep at night.  Feels like she is tired during the day.  Does not feel like she sleeps well.  Denies any drowsy driving or morning headaches.   Breathing has been doing very well.  She is currently well-controlled.  Has not had any flares requiring steroids or antibiotics.  Rare use of her rescue inhaler.  03/06/2024: Today - follow up Discussed the use of AI scribe software for clinical note transcription with the patient, who gave verbal consent to proceed.  History of Present Illness   Shawntell P Guion is a 73 year old female with asthma and sleep apnea who presents for follow up but she is having acute symptoms with persistent cough and allergy symptoms.  She has been experiencing cold-like symptoms for the past five days, including a persistent cough. She went to UC on 5/25; COVID negative. She was treated with prednisone  40 mg daily for 5 days and prescribed benzonatate . She has 2 days left of prednisone . She still is coughing and has chest tightness. Noticing some wheezing. Not necessarily feeling more short winded. She is also using nasal sprays and cough medicine. No fever is present. The nasal discharge is described as  thick and light yellow in color. No headaches, hemoptysis, sore throat, night sweats. Appetite is good.   She is currently taking Singulair  at bedtime and Advair twice daily but is not taking any daily allergy pills. She uses her albuterol  rescue inhaler as needed, right now a couple of times at night.   Regarding her sleep apnea, she uses a CPAP machine but having issues right now with it being uncomfortable and often removes it after a few hours. She was using it more before she got sick but  still taking it off around 2/3 am most nights. She does feel better with CPAP than without. She denies any drowsy driving. She does notice leaks from her machine that are bothersome.   02/03/2024-03/03/2024: CPAP 5-15 cmH2O 23/30 days; 23% >4 hr; average use 3 hr 8 min Pressure 95th 11.7 Leaks 95th 26.9 AHI 6.1   Allergies  Allergen Reactions   Shellfish Allergy Shortness Of Breath and Swelling   Shrimp (Diagnostic) Anaphylaxis   Penicillins Hives, Swelling and Other (See Comments)    Did it involve swelling of the face/tongue/throat, SOB, or low BP? Yes Did it involve sudden or severe rash/hives, skin peeling, or any reaction on the inside of your mouth or nose? Yes Did you need to seek medical attention at a hospital or doctor's office? Yes When did it last happen? Late 20's If all above answers are "NO", may proceed with cephalosporin use.     Immunization History  Administered Date(s) Administered   Hepatitis A, Ped/Adol-2 Dose 07/28/2011   Influenza Split 07/30/2013   Influenza, High Dose Seasonal PF 08/28/2018   Influenza, Quadrivalent, Recombinant, Inj, Pf 07/25/2017   Influenza,inj,Quad PF,6+ Mos 07/15/2016   PFIZER(Purple Top)SARS-COV-2 Vaccination 12/06/2019, 12/31/2019, 08/22/2020   Pneumococcal Conjugate-13 10/25/2017   Tdap 03/12/2015    Past Medical History:  Diagnosis Date   Acid reflux    Arthritis    Asthma    Chronic hepatitis B (HCC)    COPD (chronic obstructive pulmonary disease) (HCC)    Mild   GERD (gastroesophageal reflux disease)    History of iron deficiency anemia    with pregnancy   Hypercholesterolemia    Hypertension    Numbness and tingling of both upper extremities     Tobacco History: Social History   Tobacco Use  Smoking Status Never  Smokeless Tobacco Never   Counseling given: Not Answered   Outpatient Medications Prior to Visit  Medication Sig Dispense Refill   albuterol  (PROVENTIL  HFA) 108 (90 Base) MCG/ACT inhaler  Inhale 2 puffs into the lungs every 4 (four) hours as needed for wheezing or shortness of breath. 1 each 0   amLODipine  (NORVASC ) 10 MG tablet Take 10 mg by mouth daily.     ascorbic acid (VITAMIN C) 500 MG tablet Take 500 mg by mouth daily.     aspirin  EC 81 MG tablet Take 81 mg by mouth once.     atorvastatin  (LIPITOR) 40 MG tablet Take 1 tablet (40 mg total) by mouth daily. 30 tablet 2   azelastine  (ASTELIN ) 0.1 % nasal spray Place 1 spray into both nostrils 2 (two) times daily. Use in each nostril as directed 30 mL 1   benzonatate  (TESSALON ) 200 MG capsule Take 1 capsule (200 mg total) by mouth 3 (three) times daily as needed for cough. 20 capsule 0   cholecalciferol (VITAMIN D3) 25 MCG (1000 UNIT) tablet Take 1,000 Units by mouth daily.     entecavir  (BARACLUDE ) 0.5 MG tablet Take 0.5  mg by mouth daily.     fluticasone  (FLONASE ) 50 MCG/ACT nasal spray Place 1 spray into both nostrils daily. Begin by using 2 sprays in each nare daily for 3 to 5 days, then decrease to 1 spray in each nare daily. 15.8 mL 2   fluticasone -salmeterol (ADVAIR) 250-50 MCG/ACT AEPB Inhale 1 puff into the lungs every 12 (twelve) hours. 60 each 11   gabapentin  (NEURONTIN ) 300 MG capsule Take 1 capsule (300 mg total) by mouth 2 (two) times daily. (Patient taking differently: Take 300 mg by mouth at bedtime.) 60 capsule 5   hydrochlorothiazide (HYDRODIURIL) 25 MG tablet Take 25 mg by mouth daily.     hydrOXYzine  (ATARAX ) 25 MG tablet Take 1 tablet (25 mg total) by mouth every 6 (six) hours. 12 tablet 0   LYCOPENE PO Take 1 tablet by mouth daily.     melatonin 5 MG TABS Take 1 tablet (5 mg total) by mouth at bedtime as needed (insomnia). 15 tablet 0   montelukast  (SINGULAIR ) 10 MG tablet TAKE 1 TABLET BY MOUTH AT BEDTIME 30 tablet 3   Multiple Vitamins-Minerals (CENTRUM SILVER PO) Take 1 tablet by mouth daily.     omeprazole (PRILOSEC) 20 MG capsule Take 20 mg by mouth daily.      vitamin E  100 UNIT capsule Take 100 Units  by mouth daily.     predniSONE  (DELTASONE ) 20 MG tablet Take 2 tablets (40 mg total) by mouth daily for 5 days. 10 tablet 0   cetirizine  (ZYRTEC ) 5 MG tablet Take 1 tablet (5 mg total) by mouth at bedtime. 30 tablet 2   famotidine  (PEPCID ) 20 MG tablet Take 1 tablet (20 mg total) by mouth 2 (two) times daily for 3 days. 6 tablet 0   No facility-administered medications prior to visit.     Review of Systems:   Constitutional: No weight loss or gain, night sweats, fevers, chills, or lassitude. +daytime fatigue (improves with CPAP) HEENT: No headaches, difficulty swallowing, tooth/dental problems, or sore throat. No sneezing, itching, ear ache. +nasal congestion, post nasal drip  CV:  No chest pain, orthopnea, PND, swelling in lower extremities, anasarca, dizziness, palpitations, syncope Resp: +shortness of breath with exertion (baseline); cough; wheezing. No excess mucus or change in color of mucus. No hemoptysis. No chest wall deformity GI:  No heartburn, indigestion, abdominal pain GU: No dysuria, change in color of urine, urgency or frequency.  Skin: No rash, lesions, ulcerations MSK:  No joint pain or swelling.   Neuro: No dizziness or lightheadedness.  Psych: No depression or anxiety. Mood stable. +sleep disturbance     Physical Exam:  BP (!) 132/90 (BP Location: Right Arm, Patient Position: Sitting, Cuff Size: Large)   Pulse (!) 104   Ht 5\' 2"  (1.575 m)   Wt 221 lb 6.4 oz (100.4 kg)   SpO2 99%   BMI 40.49 kg/m   GEN: Pleasant, interactive, well-appearing; obese; in no acute distress. HEENT:  Normocephalic and atraumatic. PERRLA. Sclera white. Nasal turbinates erythematous, moist and patent bilaterally. Clear rhinorrhea present. Oropharynx pink and moist, without exudate or edema. No lesions, ulcerations. Mallampati III NECK:  Supple w/ fair ROM. No JVD present. Normal carotid impulses w/o bruits. Thyroid  symmetrical with no goiter or nodules palpated. No lymphadenopathy.    CV: RRR, no m/r/g, no peripheral edema. Pulses intact, +2 bilaterally. No cyanosis, pallor or clubbing. PULMONARY:  Unlabored, regular breathing. Scattered wheezes bilaterally A&P. Bronchitic cough. No accessory muscle use.  GI: BS present and normoactive. Soft,  non-tender to palpation. No organomegaly or masses detected.  MSK: No erythema, warmth or tenderness. Cap refil <2 sec all extrem. No deformities or joint swelling noted.  Neuro: A/Ox3. No focal deficits noted.   Skin: Warm, no lesions or rashe Psych: Normal affect and behavior. Judgement and thought content appropriate.     Lab Results:  CBC    Component Value Date/Time   WBC 6.7 03/20/2023 0732   RBC 4.23 03/20/2023 0732   HGB 12.7 03/20/2023 0732   HCT 39.1 03/20/2023 0732   PLT 243 03/20/2023 0732   MCV 92.4 03/20/2023 0732   MCH 30.0 03/20/2023 0732   MCHC 32.5 03/20/2023 0732   RDW 14.5 03/20/2023 0732   LYMPHSABS 2.9 03/19/2023 1106   MONOABS 0.6 03/19/2023 1106   EOSABS 0.1 03/19/2023 1106   BASOSABS 0.0 03/19/2023 1106    BMET    Component Value Date/Time   NA 135 03/20/2023 0732   K 3.9 03/20/2023 0732   CL 102 03/20/2023 0732   CO2 21 (L) 03/20/2023 0732   GLUCOSE 98 03/20/2023 0732   BUN 10 03/20/2023 0732   CREATININE 0.74 03/20/2023 0732   CALCIUM  9.4 03/20/2023 0732   GFRNONAA >60 03/20/2023 0732   GFRAA >60 05/25/2019 0218    BNP No results found for: "BNP"   Imaging:  No results found.  ipratropium-albuterol  (DUONEB) 0.5-2.5 (3) MG/3ML nebulizer solution 3 mL     Date Action Dose Route User   03/06/2024 1436 Given 3 mL Nebulization Obike, Mercy, CMA      methylPREDNISolone  acetate (DEPO-MEDROL ) injection 80 mg     Date Action Dose Route User   03/06/2024 1514 Given 80 mg Intramuscular (Right Ventrogluteal) Lonie Roa, CMA          Latest Ref Rng & Units 07/20/2022   10:51 AM  PFT Results  FVC-Pre L 1.43   FVC-Predicted Pre % 51   FVC-Post L 1.33   FVC-Predicted Post  % 47   Pre FEV1/FVC % % 87   Post FEV1/FCV % % 89   FEV1-Pre L 1.24   FEV1-Predicted Pre % 58   FEV1-Post L 1.18   DLCO uncorrected ml/min/mmHg 20.05   DLCO UNC% % 109   DLCO corrected ml/min/mmHg 20.51   DLCO COR %Predicted % 112   DLVA Predicted % 146   TLC L 3.82   TLC % Predicted % 80   RV % Predicted % 100     No results found for: "NITRICOXIDE"      Assessment & Plan:   Moderate persistent asthmatic bronchitis with exacerbation Moderate asthmatic bronchitis with bronchospasm. Slow to resolve. She was unable to complete FeNO today. Will treat her with empiric azithromycin  and depo 80 mg inj x 1. Extend prednisone  with taper. Cough control and supportive care measures. Continue bronchodilator regimen. Action plan in place. Strict return precautions reviewed.  Patient Instructions  We discussed how untreated sleep apnea puts an individual at risk for cardiac arrhthymias, pulm HTN, DM, stroke and increases their risk for daytime accidents.   Increase use of CPAP Use every night, minimum of 4-6 hours a night but goal is to wear the entire night  Change equipment as directed. Wash your tubing with warm soap and water daily, hang to dry. Wash humidifier portion weekly. Change water daily with bottled distilled water  Be aware of reduced alertness and do not drive or operate heavy machinery if experiencing this or drowsiness.  Healthy weight management discussed.  Notify if persistent  daytime sleepiness occurs even with consistent use of CPAP.  I changed your settings on your CPAP so hopefully this will help with the leaks  Continue Albuterol  inhaler 2 puffs every 6 hours as needed for shortness of breath or wheezing. Notify if symptoms persist despite rescue inhaler/neb use. Continue flonase  nasal spray 2 sprays each nostril daily Continue advair 1 puff Twice daily. Brush tongue and rinse mouth afterwards Continue montelukast  (singulair ) 1 tab At bedtime   Continue  prednisone  40 mg and then taper off to 3 tablets for 2 days, 2 tablets for 2 days then 1 tablet for 2 days Azithromycin  - take 2 tablets on day one then 1 tablet daily for four additional days Start zyrtec , claritin or xyzal over the counter or similar store brand version Promethazine  DM cough syrup 5 mL every 6 hours as needed for cough. Do not drive after taking. May cause drowsiness.    Follow up in 2-3  weeks with Dr. Gaynell Keeler or Gina Lagos. If symptoms do not improve or worsen, please contact office for sooner follow up or seek emergency care.    Moderate obstructive sleep apnea Suboptimal compliance. Reviewed risks of untreated OSA. Will tighten her settings to 8-12 cmH2O and reassess response at follow up. Encouraged to work on increasing usage. Safe driving practices reviewed.   Allergic rhinitis Allergic rhinitis vs URI. Negative COVID testing. See above. Start daily non drowsy antihistamine.      I spent 45 minutes of dedicated to the care of this patient on the date of this encounter to include pre-visit review of records, face-to-face time with the patient discussing conditions above, post visit ordering of testing, clinical documentation with the electronic health record, making appropriate referrals as documented, and communicating necessary findings to members of the patients care team.  Roetta Clarke, NP 03/06/2024  Pt aware and understands NP's role.

## 2024-03-06 NOTE — Assessment & Plan Note (Signed)
 Allergic rhinitis vs URI. Negative COVID testing. See above. Start daily non drowsy antihistamine.

## 2024-03-06 NOTE — Assessment & Plan Note (Signed)
 Suboptimal compliance. Reviewed risks of untreated OSA. Will tighten her settings to 8-12 cmH2O and reassess response at follow up. Encouraged to work on increasing usage. Safe driving practices reviewed.

## 2024-03-06 NOTE — Patient Instructions (Addendum)
 We discussed how untreated sleep apnea puts an individual at risk for cardiac arrhthymias, pulm HTN, DM, stroke and increases their risk for daytime accidents.   Increase use of CPAP Use every night, minimum of 4-6 hours a night but goal is to wear the entire night  Change equipment as directed. Wash your tubing with warm soap and water daily, hang to dry. Wash humidifier portion weekly. Change water daily with bottled distilled water  Be aware of reduced alertness and do not drive or operate heavy machinery if experiencing this or drowsiness.  Healthy weight management discussed.  Notify if persistent daytime sleepiness occurs even with consistent use of CPAP.  I changed your settings on your CPAP so hopefully this will help with the leaks  Continue Albuterol  inhaler 2 puffs every 6 hours as needed for shortness of breath or wheezing. Notify if symptoms persist despite rescue inhaler/neb use. Continue flonase  nasal spray 2 sprays each nostril daily Continue advair 1 puff Twice daily. Brush tongue and rinse mouth afterwards Continue montelukast  (singulair ) 1 tab At bedtime   Continue prednisone  40 mg and then taper off to 3 tablets for 2 days, 2 tablets for 2 days then 1 tablet for 2 days Azithromycin  - take 2 tablets on day one then 1 tablet daily for four additional days Start zyrtec , claritin or xyzal over the counter or similar store brand version Promethazine DM cough syrup 5 mL every 6 hours as needed for cough. Do not drive after taking. May cause drowsiness.    Follow up in 2-3  weeks with Dr. Gaynell Keeler or Gina Lagos. If symptoms do not improve or worsen, please contact office for sooner follow up or seek emergency care.

## 2024-03-06 NOTE — Assessment & Plan Note (Signed)
 Moderate asthmatic bronchitis with bronchospasm. Slow to resolve. She was unable to complete FeNO today. Will treat her with empiric azithromycin  and depo 80 mg inj x 1. Extend prednisone  with taper. Cough control and supportive care measures. Continue bronchodilator regimen. Action plan in place. Strict return precautions reviewed.  Patient Instructions  We discussed how untreated sleep apnea puts an individual at risk for cardiac arrhthymias, pulm HTN, DM, stroke and increases their risk for daytime accidents.   Increase use of CPAP Use every night, minimum of 4-6 hours a night but goal is to wear the entire night  Change equipment as directed. Wash your tubing with warm soap and water daily, hang to dry. Wash humidifier portion weekly. Change water daily with bottled distilled water  Be aware of reduced alertness and do not drive or operate heavy machinery if experiencing this or drowsiness.  Healthy weight management discussed.  Notify if persistent daytime sleepiness occurs even with consistent use of CPAP.  I changed your settings on your CPAP so hopefully this will help with the leaks  Continue Albuterol  inhaler 2 puffs every 6 hours as needed for shortness of breath or wheezing. Notify if symptoms persist despite rescue inhaler/neb use. Continue flonase  nasal spray 2 sprays each nostril daily Continue advair 1 puff Twice daily. Brush tongue and rinse mouth afterwards Continue montelukast  (singulair ) 1 tab At bedtime   Continue prednisone  40 mg and then taper off to 3 tablets for 2 days, 2 tablets for 2 days then 1 tablet for 2 days Azithromycin  - take 2 tablets on day one then 1 tablet daily for four additional days Start zyrtec , claritin or xyzal over the counter or similar store brand version Promethazine DM cough syrup 5 mL every 6 hours as needed for cough. Do not drive after taking. May cause drowsiness.    Follow up in 2-3  weeks with Dr. Gaynell Keeler or Gina Lagos. If symptoms do  not improve or worsen, please contact office for sooner follow up or seek emergency care.

## 2024-03-07 ENCOUNTER — Telehealth: Payer: Self-pay

## 2024-03-07 NOTE — Telephone Encounter (Signed)
 Copied from CRM [id number]. Topic: Clinical - Prescription Issue >> Mar 06, 2024  3:08 PM Justina Oman C wrote: Reason for CRM: Keane Passe from [facility] [phone] needs clarification on sig for predniSONE  (DELTASONE ) 10 MG tablet, how many tablets is patient to take? Please call back.  Called and spoke with Kamryn at [facility]. Verified medication sig. Nothing further needed.

## 2024-06-05 ENCOUNTER — Other Ambulatory Visit (HOSPITAL_BASED_OUTPATIENT_CLINIC_OR_DEPARTMENT_OTHER): Payer: Self-pay | Admitting: Family Medicine

## 2024-06-05 DIAGNOSIS — Z1239 Encounter for other screening for malignant neoplasm of breast: Secondary | ICD-10-CM

## 2024-06-09 ENCOUNTER — Other Ambulatory Visit (HOSPITAL_BASED_OUTPATIENT_CLINIC_OR_DEPARTMENT_OTHER): Payer: Self-pay | Admitting: Family Medicine

## 2024-06-09 DIAGNOSIS — E2839 Other primary ovarian failure: Secondary | ICD-10-CM

## 2024-07-11 ENCOUNTER — Telehealth: Payer: Self-pay

## 2024-07-11 NOTE — Telephone Encounter (Signed)
 Received CMN from [facility],faxed/received fax confirmation

## 2024-07-12 ENCOUNTER — Ambulatory Visit
Admission: RE | Admit: 2024-07-12 | Discharge: 2024-07-12 | Disposition: A | Discharge status: Home / Self Care | Source: Ambulatory Visit | Attending: Family Medicine | Admitting: Family Medicine

## 2024-07-12 DIAGNOSIS — Z1239 Encounter for other screening for malignant neoplasm of breast: Secondary | ICD-10-CM

## 2024-09-19 ENCOUNTER — Encounter (HOSPITAL_COMMUNITY): Payer: Self-pay

## 2024-09-19 ENCOUNTER — Ambulatory Visit (INDEPENDENT_AMBULATORY_CARE_PROVIDER_SITE_OTHER)

## 2024-09-19 ENCOUNTER — Telehealth: Payer: Self-pay

## 2024-09-19 ENCOUNTER — Ambulatory Visit (HOSPITAL_COMMUNITY)
Admission: EM | Admit: 2024-09-19 | Discharge: 2024-09-19 | Disposition: A | Discharge status: Home / Self Care | Attending: Family Medicine | Admitting: Family Medicine

## 2024-09-19 DIAGNOSIS — J4541 Moderate persistent asthma with (acute) exacerbation: Secondary | ICD-10-CM | POA: Diagnosis not present

## 2024-09-19 LAB — POC COVID19/FLU A&B COMBO
Covid Antigen, POC: NEGATIVE
Influenza A Antigen, POC: NEGATIVE
Influenza B Antigen, POC: NEGATIVE

## 2024-09-19 MED ORDER — BENZONATATE 100 MG PO CAPS: 100.0000 mg | ORAL_CAPSULE | Freq: Three times a day (TID) | ORAL | 0 refills | Status: DC | PRN

## 2024-09-19 MED ORDER — IPRATROPIUM-ALBUTEROL 0.5-2.5 (3) MG/3ML IN SOLN
RESPIRATORY_TRACT | Status: AC
  Filled 2024-09-19: qty 3

## 2024-09-19 MED ORDER — PREDNISONE 20 MG PO TABS: 40.0000 mg | ORAL_TABLET | Freq: Every day | ORAL | 0 refills | Status: DC

## 2024-09-19 MED ORDER — METHYLPREDNISOLONE SODIUM SUCC 125 MG IJ SOLR
INTRAMUSCULAR | Status: AC
  Filled 2024-09-19: qty 2

## 2024-09-19 MED ORDER — ALBUTEROL SULFATE HFA 108 (90 BASE) MCG/ACT IN AERS: 2.0000 | INHALATION_SPRAY | RESPIRATORY_TRACT | 0 refills | Status: AC | PRN

## 2024-09-19 MED ORDER — IPRATROPIUM-ALBUTEROL 0.5-2.5 (3) MG/3ML IN SOLN
3.0000 mL | Freq: Once | RESPIRATORY_TRACT | Status: AC
  Administered 2024-09-19: 3 mL via RESPIRATORY_TRACT

## 2024-09-19 MED ORDER — METHYLPREDNISOLONE SODIUM SUCC 125 MG IJ SOLR
80.0000 mg | Freq: Once | INTRAMUSCULAR | Status: AC
  Administered 2024-09-19: 80 mg via INTRAMUSCULAR

## 2024-09-19 NOTE — ED Triage Notes (Addendum)
 Pt has c/o a cough that started at 7pm. States has been feeling short of breath. Has taken asthma medication before arriving, but is running low. Medication hasn't helped with relief. Has some chest discomfort from coughing

## 2024-09-19 NOTE — Discharge Instructions (Signed)
 You are having an exacerbation of asthma due to a viral infection.  COVID-19 and influenza test is negative today.  Start using the albuterol  inhaler every 4-6 hours scheduled for the next 2 days, then use as needed.  Start taking the oral prednisone  to help with lung inflammation starting tomorrow morning. You can also take Tessalon  Perles every 8 hours as needed for cough. Symptoms should improve over the next few days.  If symptoms worsen despite treatment, please return for reevaluation or follow-up in the ER.

## 2024-09-19 NOTE — Telephone Encounter (Signed)
 Paper received from [facility] regarding pt's cpap machine and supplies. This is asking for a provider signature and clinical notes. This has been signed and lov notes are printed. Sent for fax. NFN

## 2024-09-19 NOTE — ED Provider Notes (Signed)
 [facility]    CSN: [id number] Arrival date & time: 09/19/24  1300      History   Chief Complaint Chief Complaint  Patient presents with   Shortness of Breath    HPI Nishtha P Shenker is a 73 y.o. female.   Patient presents today with 1 day history of hot and cold chills, congested cough, shortness of breath, wheezing, chest tightness, runny nose, sore throat, decreased appetite, and fatigue.  She denies fever, body aches, headache, ear pain, abdominal pain, nausea/vomiting, or diarrhea.  No known sick contacts.  Has been taking over-the-counter cough medicine and albuterol  rescue inhaler without improvement.    Past Medical History:  Diagnosis Date   Acid reflux    Arthritis    Asthma    Chronic hepatitis B (HCC)    COPD (chronic obstructive pulmonary disease) (HCC)    Mild   GERD (gastroesophageal reflux disease)    History of iron deficiency anemia    with pregnancy   Hypercholesterolemia    Hypertension    Numbness and tingling of both upper extremities     Patient Active Problem List   Diagnosis Date Noted   Allergic rhinitis 03/06/2024   Altered mental status, unspecified 03/19/2023   Moderate persistent asthmatic bronchitis with exacerbation 09/15/2022   Moderate obstructive sleep apnea 09/15/2022   Class 3 severe obesity due to excess calories in adult [facility]) 09/15/2022   Hypertension    Primary localized osteoarthritis of left knee 05/24/2019   GERD (gastroesophageal reflux disease)    Hypercholesterolemia     Past Surgical History:  Procedure Laterality Date   BACK SURGERY     BILATERAL CARPAL TUNNEL RELEASE     CERVICAL SPINE SURGERY     KNEE SURGERY Left    x2   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      Home Medications    Prior to Admission  medications  Medication Sig Start Date End Date Taking? Authorizing Provider  benzonatate  (TESSALON ) 100 MG capsule Take 1 capsule (100 mg total) by mouth 3 (three) times daily as needed for cough. Do not take with alcohol or while operating or driving heavy machinery 87/88/74  Yes Chandra Raisin A, NP  predniSONE  (DELTASONE ) 20 MG tablet Take 2 tablets (40 mg total) by mouth daily with breakfast for 5 days. 09/19/24 09/24/24 Yes Chandra Raisin LABOR, NP  albuterol  (PROVENTIL  HFA) 108 (90 Base) MCG/ACT inhaler Inhale 2 puffs into the lungs every 4 (four) hours as needed for wheezing or shortness of breath. 09/19/24   Chandra Raisin LABOR, NP  amLODipine  (NORVASC ) 10 MG tablet Take 10 mg by mouth daily.    [provider]  ascorbic acid (VITAMIN C) 500 MG tablet Take 500 mg by mouth daily. 09/06/19   [provider]  aspirin  EC 81 MG tablet Take 81 mg by mouth once. 05/02/16   [provider]  atorvastatin  (LIPITOR) 40 MG tablet Take 1 tablet (40 mg total) by mouth daily. 03/21/23   Krishnan, Gokul, MD  azelastine  (ASTELIN ) 0.1 % nasal spray Place 1 spray into both nostrils 2 (two) times daily. Use in each nostril as directed 03/03/24   Reddick, Johnathan B, NP  azithromycin  (ZITHROMAX ) 250 MG tablet Take 2 tablets on day one then take 1 tablet daily for four additional  days 03/06/24   Malachy Comer GAILS, NP  cetirizine  (ZYRTEC ) 5 MG tablet Take 1 tablet (5 mg total) by mouth at bedtime. 09/28/23 12/27/23  Joesph Shaver Scales, PA-C  cholecalciferol (VITAMIN D3) 25 MCG (1000 UNIT) tablet Take 1,000 Units by mouth daily. 10/25/17   [provider]  entecavir  (BARACLUDE ) 0.5 MG tablet Take 0.5 mg by mouth daily.    [provider]  famotidine  (PEPCID ) 20 MG tablet Take 1 tablet (20 mg total) by mouth 2 (two) times daily for 3 days. 02/18/22 03/20/23  Blaise Aleene KIDD, MD  fluticasone  (FLONASE ) 50 MCG/ACT nasal spray Place 1 spray into both nostrils daily. Begin by  using 2 sprays in each nare daily for 3 to 5 days, then decrease to 1 spray in each nare daily. 09/28/23   Joesph Shaver Scales, PA-C  fluticasone -salmeterol (ADVAIR) 250-50 MCG/ACT AEPB Inhale 1 puff into the lungs every 12 (twelve) hours. 06/08/22   Sood, Vineet, MD  gabapentin  (NEURONTIN ) 300 MG capsule Take 1 capsule (300 mg total) by mouth 2 (two) times daily. Patient taking differently: Take 300 mg by mouth at bedtime. 01/11/16   Patel, Donika K, DO  hydrochlorothiazide (HYDRODIURIL) 25 MG tablet Take 25 mg by mouth daily. 03/08/23   [provider]  hydrOXYzine  (ATARAX ) 25 MG tablet Take 1 tablet (25 mg total) by mouth every 6 (six) hours. 02/18/22   Lamptey, Aleene KIDD, MD  LYCOPENE PO Take 1 tablet by mouth daily. 09/06/19   [provider]  melatonin 5 MG TABS Take 1 tablet (5 mg total) by mouth at bedtime as needed (insomnia). 03/21/23   Krishnan, Gokul, MD  montelukast  (SINGULAIR ) 10 MG tablet TAKE 1 TABLET BY MOUTH AT BEDTIME 06/05/23   Sood, Vineet, MD  Multiple Vitamins-Minerals (CENTRUM SILVER PO) Take 1 tablet by mouth daily.    [provider]  omeprazole (PRILOSEC) 20 MG capsule Take 20 mg by mouth daily.  06/04/15   [provider]  promethazine -dextromethorphan (PROMETHAZINE -DM) 6.25-15 MG/5ML syrup Take 5 mLs by mouth 4 (four) times daily as needed for cough. 03/06/24   Cobb, Comer GAILS, NP  vitamin E  100 UNIT capsule Take 100 Units by mouth daily.    [provider]    Family History Family History  Problem Relation Age of Onset   Cancer Mother    Cancer Father     Social History Social History[1]   Allergies   Shellfish allergy, Shrimp (diagnostic), and Penicillins   Review of Systems Review of Systems Per HPI  Physical Exam Triage Vital Signs ED Triage Vitals  Encounter Vitals Group     BP 09/19/24 1321 (!) 145/87     Girls Systolic BP Percentile --      Girls Diastolic BP Percentile --      Boys Systolic BP  Percentile --      Boys Diastolic BP Percentile --      Pulse Rate 09/19/24 1321 100     Resp 09/19/24 1321 19     Temp 09/19/24 1321 98.5 F (36.9 C)     Temp Source 09/19/24 1321 Oral     SpO2 09/19/24 1321 95 %     Weight --      Height --      Head Circumference --      Peak Flow --      Pain Score 09/19/24 1323 4     Pain Loc --      Pain Education --  Exclude from Growth Chart --    No data found.  Updated Vital Signs BP (!) 145/87 (BP Location: Left Arm)   Pulse 100   Temp 98.5 F (36.9 C) (Oral)   Resp 19   SpO2 95%   Visual Acuity Right Eye Distance:   Left Eye Distance:   Bilateral Distance:    Right Eye Near:   Left Eye Near:    Bilateral Near:     Physical Exam Vitals and nursing note reviewed.  Constitutional:      General: She is not in acute distress.    Appearance: Normal appearance. She is not ill-appearing or toxic-appearing.  HENT:     Head: Normocephalic and atraumatic.     Right Ear: Tympanic membrane, ear canal and external ear normal.     Left Ear: Tympanic membrane, ear canal and external ear normal.     Nose: No congestion or rhinorrhea.     Mouth/Throat:     Mouth: Mucous membranes are moist.     Pharynx: Oropharynx is clear. No oropharyngeal exudate or posterior oropharyngeal erythema.  Eyes:     General: No scleral icterus.    Extraocular Movements: Extraocular movements intact.  Cardiovascular:     Rate and Rhythm: Normal rate and regular rhythm.  Pulmonary:     Effort: Pulmonary effort is normal. No respiratory distress.     Breath sounds: Decreased air movement present. Wheezing present. No rhonchi or rales.  Musculoskeletal:     Cervical back: Normal range of motion and neck supple.  Lymphadenopathy:     Cervical: No cervical adenopathy.  Skin:    General: Skin is warm and dry.     Coloration: Skin is not jaundiced or pale.     Findings: No erythema or rash.  Neurological:     Mental Status: She is alert and  oriented to person, place, and time.  Psychiatric:        Behavior: Behavior is cooperative.      UC Treatments / Results  Labs (all labs ordered are listed, but only abnormal results are displayed) Labs Reviewed  POC COVID19/FLU A&B COMBO    EKG   Radiology No results found.  Procedures Procedures (including critical care time)  Medications Ordered in UC Medications  methylPREDNISolone  sodium succinate (SOLU-MEDROL ) 125 mg/2 mL injection 80 mg (80 mg Intramuscular Given 09/19/24 1346)  ipratropium-albuterol  (DUONEB) 0.5-2.5 (3) MG/3ML nebulizer solution 3 mL (3 mLs Nebulization Given 09/19/24 1346)    Initial Impression / Assessment and Plan / UC Course  I have reviewed the triage vital signs and the nursing notes.  Pertinent labs & imaging results that were available during my care of the patient were reviewed by me and considered in my medical decision making (see chart for details).   Patient is a very pleasant, well-appearing 73 year old African-American female presenting today for shortness of breath and cough.  Vital signs are stable.  On exam, patient has inspiratory and expiratory wheezes as well as decreased air movement to bilateral lung fields.  Chest x-ray obtained; DuoNeb breathing treatment and Solu-Medrol  IM given in urgent care today.  On reexamination, lungs are clear to auscultation bilaterally.  COVID-19 and influenza testing is negative today.  Upon my independent review, chest x-ray is negative for acute cardiopulmonary disease.  Will treat patient with scheduled albuterol  inhaler at home, start oral prednisone  tomorrow morning, cough suppressant medication.  Other supportive care discussed additionally.  ER precautions discussed.  The patient was given the opportunity to  ask questions.  All questions answered to their satisfaction.  The patient is in agreement to this plan.   Final Clinical Impressions(s) / UC Diagnoses   Final diagnoses:  Moderate  persistent asthma with acute exacerbation     Discharge Instructions      You are having an exacerbation of asthma due to a viral infection.  COVID-19 and influenza test is negative today.  Start using the albuterol  inhaler every 4-6 hours scheduled for the next 2 days, then use as needed.  Start taking the oral prednisone  to help with lung inflammation starting tomorrow morning. You can also take Tessalon  Perles every 8 hours as needed for cough. Symptoms should improve over the next few days.  If symptoms worsen despite treatment, please return for reevaluation or follow-up in the ER.     ED Prescriptions     Medication Sig Dispense Auth. Provider   albuterol  (PROVENTIL  HFA) 108 (90 Base) MCG/ACT inhaler Inhale 2 puffs into the lungs every 4 (four) hours as needed for wheezing or shortness of breath. 1 each Chandra Harlene LABOR, NP   benzonatate  (TESSALON ) 100 MG capsule Take 1 capsule (100 mg total) by mouth 3 (three) times daily as needed for cough. Do not take with alcohol or while operating or driving heavy machinery 21 capsule Chandra Harlene A, NP   predniSONE  (DELTASONE ) 20 MG tablet Take 2 tablets (40 mg total) by mouth daily with breakfast for 5 days. 10 tablet Chandra Harlene LABOR, NP      PDMP not reviewed this encounter.    [1]  Social History Tobacco Use   Smoking status: Never   Smokeless tobacco: Never  Vaping Use   Vaping status: Never Used  Substance Use Topics   Alcohol use: No    Alcohol/week: 0.0 standard drinks of alcohol   Drug use: No     Chandra Harlene LABOR, NP 09/19/24 1451

## 2024-09-20 ENCOUNTER — Ambulatory Visit (HOSPITAL_COMMUNITY): Payer: Self-pay

## 2024-09-24 ENCOUNTER — Encounter: Payer: Self-pay | Admitting: Nurse Practitioner

## 2024-09-24 ENCOUNTER — Ambulatory Visit: Admitting: Nurse Practitioner

## 2024-09-24 VITALS — BP 134/77 | HR 80 | Temp 98.2°F | Ht 62.0 in | Wt 231.0 lb

## 2024-09-24 DIAGNOSIS — J069 Acute upper respiratory infection, unspecified: Secondary | ICD-10-CM | POA: Insufficient documentation

## 2024-09-24 DIAGNOSIS — G4733 Obstructive sleep apnea (adult) (pediatric): Secondary | ICD-10-CM

## 2024-09-24 DIAGNOSIS — J4541 Moderate persistent asthma with (acute) exacerbation: Secondary | ICD-10-CM

## 2024-09-24 MED ORDER — ALBUTEROL SULFATE (2.5 MG/3ML) 0.083% IN NEBU
2.5000 mg | INHALATION_SOLUTION | Freq: Once | RESPIRATORY_TRACT | Status: AC
  Administered 2024-09-24: 11:00:00 2.5 mg via RESPIRATORY_TRACT

## 2024-09-24 MED ORDER — METHYLPREDNISOLONE ACETATE 80 MG/ML IJ SUSP
120.0000 mg | Freq: Once | INTRAMUSCULAR | Status: AC
  Administered 2024-09-24: 11:00:00 120 mg via INTRAMUSCULAR

## 2024-09-24 MED ORDER — IPRATROPIUM BROMIDE 0.06 % NA SOLN: 2.0000 | Freq: Three times a day (TID) | NASAL | 12 refills | Status: AC

## 2024-09-24 MED ORDER — FLUTICASONE-SALMETEROL 500-50 MCG/ACT IN AEPB: 1.0000 | INHALATION_SPRAY | Freq: Two times a day (BID) | RESPIRATORY_TRACT | 11 refills | Status: AC

## 2024-09-24 MED ORDER — PREDNISONE 10 MG PO TABS: ORAL_TABLET | ORAL | 0 refills | Status: AC

## 2024-09-24 MED ORDER — PROMETHAZINE-DM 6.25-15 MG/5ML PO SYRP: 5.0000 mL | ORAL_SOLUTION | Freq: Four times a day (QID) | ORAL | 0 refills | Status: AC | PRN

## 2024-09-24 MED ORDER — BENZONATATE 200 MG PO CAPS: 200.0000 mg | ORAL_CAPSULE | Freq: Three times a day (TID) | ORAL | 1 refills | Status: AC | PRN

## 2024-09-24 NOTE — Assessment & Plan Note (Signed)
 Moderate asthmatic bronchitis with bronchospasm on exam. Likely secondary to viral illness given constellation of symptoms. Recent CXR without superimposed illness. Reviewed typical timeline for viral symptoms. She was unable to complete FeNO today. Will treat her with extended prednisone  taper and depo 120 mg inj x 1. Albuterol  neb in office with improved airflow. Hold on antimicrobial therapy at this time given no evidence of superimposed infection and likelihood of viral illness. Cough control and supportive care measures. Step up ICS to high dose Advair and optimize bronchodilator regimen with addition of nebulizer treatments during acute illness. Action plan in place. Strict return precautions reviewed. Close follow up  Patient Instructions  Use CPAP every night, minimum of 4-6 hours a night but goal is to wear the entire night  Change equipment as directed. Wash your tubing with warm soap and water daily, hang to dry. Wash humidifier portion weekly. Change water daily with bottled distilled water  Be aware of reduced alertness and do not drive or operate heavy machinery if experiencing this or drowsiness.  Healthy weight management discussed.  Notify if persistent daytime sleepiness occurs even with consistent use of CPAP.   Continue Albuterol  inhaler 2 puffs or 3 mL nebulizer treatment every 6 hours as needed for shortness of breath or wheezing. Notify if symptoms persist despite rescue inhaler/neb use. Use nebulizer treatment 2-3 times a day until symptoms resolve  Continue flonase  nasal spray 2 sprays each nostril daily Continue advair 1 puff Twice daily. I increased the dosing of  your inhaled steroid so you will start a new Advair inhaler. Brush tongue and rinse mouth afterwards Continue montelukast  (singulair ) 1 tab At bedtime    -Prednisone  taper. 4 tabs for 3 days, then 3 tabs for 3 days, 2 tabs for 3 days, then 1 tab for 3 days, then stop. Take in AM with food. Start tomorrow.  Steroid  shot today -Promethazine  DM cough syrup 5 mL every 6 hours as needed for cough. Do not drive after taking. May cause drowsiness.  -Benzonatate  1 capsule Three times a day for cough. Use consistently over the next few days  -Guaifenesin [phone] mg Twice daily for cough/congestion -Ipratropium nasal spray 2 sprays each nostril Three times a day for nasal congestion/drainage -Use saline nasal rinse such as netti pot or neil med rinse (use bottled, distilled water) 1-2 times a day until symptoms resolve. Wait 20-30 minutes then do your nasal sprays    Follow up in 2-3  weeks with Dr. Neda. If symptoms do not improve or worsen, please contact office for sooner follow up or seek emergency care.

## 2024-09-24 NOTE — Progress Notes (Signed)
 @Patient  ID: [name], female    DOB: Apr 22, 1951, 73 y.o.   MRN: [id number]  Chief Complaint  Patient presents with   Acute Visit    Increased cough, nasal congestion, wheezing for approx a wk. She is coughing up yellow sputum.     Referring provider: No ref. provider found  HPI: 73 year old female, never smoker followed for asthma, allergic rhinitis and mild OSA.  She is a former patient of Dr. Magdaleno and last seen in office 03/06/2024 by [facility] NP.  Past medical history significant for GERD, arthritis, chronic hepatitis B with cirrhosis, HLD, hypertension.  TEST/EVENTS:  07/20/2022 PFT: FVC 51, FEV1 58, ratio 89, TLC 80, DLCOcor 112 08/17/2022 HST: AHI 15.7/h, SpO2 low 74%, average 94% 09/19/2024 CXR: clear lungs   08/02/2022: OV with Dr. Shellia. CXR from August nl. IgE 207, eos 0.1. PFT mixed obstruction and restriction. Sinus congestion and postnasal drip. Gets itchy/watery eyes when allergies flare. Still having trouble with sleep and loud snoring. HST previously ordered - will f/u on this. Advised to trial nasal irrigations daily and OTC olopatadine  eye drops. Continued advair, singulair , flonase   09/15/2022: OV with Colleen Pecora NP for follow-up with her daughter to discuss home sleep study results.  She had HST on 11/8 which revealed moderate obstructive sleep apnea with AHI of 15.7.  She continues to have trouble with her sleep and snores mildly at night.  She also feels like sometimes when she is driving she forgets where she is going.  She feels very tired throughout the day.  Denies any sleep parasomnia/paralysis, drowsy driving.  No history of narcolepsy or cataplexy.  Her breathing is overall stable.  She continues on Advair and Singulair .  She also uses Flonase  and occasionally uses nasal irrigations for her allergies, which seem to be well-controlled right now.  07/05/2023: OV with Colleen Khurana NP for overdue follow up.  After her last visit, she was ordered a CPAP machine but never  received this.  She had called in January regarding this and [employer] had said that they were going to run it after the first of the year.  She never heard anything else.  Her daughter passed away 2 months ago so she has not been as focused on her health.  She knows that she needs to go ahead and get started on therapy and so she wanted to come back and see us .  She still having trouble with sleep at night.  Feels like she is tired during the day.  Does not feel like she sleeps well.  Denies any drowsy driving or morning headaches.   Breathing has been doing very well.  She is currently well-controlled.  Has not had any flares requiring steroids or antibiotics.  Rare use of her rescue inhaler.  03/06/2024: OV with Colleen Devlin NP Colleen [name] is a 73 year old female with asthma and sleep apnea who presents for follow up but she is having acute symptoms with persistent cough and allergy symptoms. She has been experiencing cold-like symptoms for the past five days, including a persistent cough. She went to UC on 5/25; COVID negative. She was treated with prednisone  40 mg daily for 5 days and prescribed benzonatate . She has 2 days left of prednisone . She still is coughing and has chest tightness. Noticing some wheezing. Not necessarily feeling more short winded. She is also using nasal sprays and cough medicine. No fever is present. The nasal discharge is described as thick and light yellow in color. No headaches,  hemoptysis, sore throat, night sweats. Appetite is good.  She is currently taking Singulair  at bedtime and Advair twice daily but is not taking any daily allergy pills. She uses her albuterol  rescue inhaler as needed, right now a couple of times at night.  Regarding her sleep apnea, she uses a CPAP machine but having issues right now with it being uncomfortable and often removes it after a few hours. She was using it more before she got sick but still taking it off around 2/3 am most nights. She does feel  better with CPAP than without. She denies any drowsy driving. She does notice leaks from her machine that are bothersome.  02/03/2024-03/03/2024: CPAP 5-15 cmH2O 23/30 days; 23% >4 hr; average use 3 hr 8 min Pressure 95th 11.7 Leaks 95th 26.9 AHI 6.1  09/24/2024: Today - acute Discussed the use of AI scribe software for clinical note transcription with the patient, who gave verbal consent to proceed.  History of Present Illness Colleen [name] is a 73 year old female with asthma who presents with persistent cough and sinus congestion.  Her symptoms began on Thursday, characterized by a persistent cough and sinus congestion. She visited urgent care and was prescribed prednisone , which she completed today. She tested negative for COVID/flu. She has been using her albuterol  inhaler twice daily as needed and Advair twice daily. She also uses Flonase  nasal spray twice daily. She is wheezing more than usual, which is why she has been using the albuterol . She does feel a little more short winded than her baseline. No low oxygen  levels. Has a lot of sinus drainage. No headaches, sore throat, ear pain, sinus tenderness, fevers, chills, hemoptysis. Eating and drinking normally.   She describes coughing up yellow sputum. The cough is particularly bothersome at night, affecting her sleep. No PND or orthopnea.   She is still doing her flonase  nasal spray. No over the counter cough/cold medications  Regarding her CPAP, she is using this the majority of the nights. She sleeps well with it for the most part. Leaks/pressure settings have been better since the prior adjustments. Does help with energy. No drowsy driving.     Allergies  Allergen Reactions   Shellfish Allergy Shortness Of Breath and Swelling   Shrimp (Diagnostic) Anaphylaxis   Penicillins Hives, Swelling and Other (See Comments)    Did it involve swelling of the face/tongue/throat, SOB, or low BP? Yes Did it involve sudden or severe  rash/hives, skin peeling, or any reaction on the inside of your mouth or nose? Yes Did you need to seek medical attention at a hospital or doctor's office? Yes When did it last happen? Late 20's If all above answers are NO, may proceed with cephalosporin use.     Immunization History  Administered Date(s) Administered   Hepatitis A, Ped/Adol-2 Dose 07/28/2011   INFLUENZA, HIGH DOSE SEASONAL PF 08/28/2018, 07/10/2024   Influenza Split 07/30/2013   Influenza, Quadrivalent, Recombinant, Inj, Pf 07/25/2017   Influenza,inj,Quad PF,6+ Mos 07/15/2016   PFIZER(Purple Top)SARS-COV-2 Vaccination 12/06/2019, 12/31/2019, 08/22/2020   Pneumococcal Conjugate-13 10/25/2017   Tdap 03/12/2015    Past Medical History:  Diagnosis Date   Acid reflux    Arthritis    Asthma    Chronic hepatitis B (HCC)    COPD (chronic obstructive pulmonary disease) (HCC)    Mild   GERD (gastroesophageal reflux disease)    History of iron deficiency anemia    with pregnancy   Hypercholesterolemia    Hypertension  Numbness and tingling of both upper extremities     Tobacco History: Social History   Tobacco Use  Smoking Status Never  Smokeless Tobacco Never   Counseling given: Not Answered   Outpatient Medications Prior to Visit  Medication Sig Dispense Refill   albuterol  (PROVENTIL  HFA) 108 (90 Base) MCG/ACT inhaler Inhale 2 puffs into the lungs every 4 (four) hours as needed for wheezing or shortness of breath. 1 each 0   amLODipine  (NORVASC ) 10 MG tablet Take 10 mg by mouth daily.     ascorbic acid (VITAMIN C) 500 MG tablet Take 500 mg by mouth daily.     aspirin  EC 81 MG tablet Take 81 mg by mouth once.     atorvastatin  (LIPITOR) 40 MG tablet Take 1 tablet (40 mg total) by mouth daily. 30 tablet 2   azelastine  (ASTELIN ) 0.1 % nasal spray Place 1 spray into both nostrils 2 (two) times daily. Use in each nostril as directed 30 mL 1   cetirizine  (ZYRTEC ) 5 MG tablet Take 1 tablet (5 mg total) by  mouth at bedtime. 30 tablet 2   cholecalciferol (VITAMIN D3) 25 MCG (1000 UNIT) tablet Take 1,000 Units by mouth daily.     entecavir  (BARACLUDE ) 0.5 MG tablet Take 0.5 mg by mouth daily.     famotidine  (PEPCID ) 20 MG tablet Take 1 tablet (20 mg total) by mouth 2 (two) times daily for 3 days. 6 tablet 0   fluticasone  (FLONASE ) 50 MCG/ACT nasal spray Place 1 spray into both nostrils daily. Begin by using 2 sprays in each nare daily for 3 to 5 days, then decrease to 1 spray in each nare daily. 15.8 mL 2   gabapentin  (NEURONTIN ) 300 MG capsule Take 1 capsule (300 mg total) by mouth 2 (two) times daily. (Patient taking differently: Take 300 mg by mouth at bedtime.) 60 capsule 5   hydrochlorothiazide (HYDRODIURIL) 25 MG tablet Take 25 mg by mouth daily.     hydrOXYzine  (ATARAX ) 25 MG tablet Take 1 tablet (25 mg total) by mouth every 6 (six) hours. 12 tablet 0   LYCOPENE PO Take 1 tablet by mouth daily.     melatonin 5 MG TABS Take 1 tablet (5 mg total) by mouth at bedtime as needed (insomnia). 15 tablet 0   montelukast  (SINGULAIR ) 10 MG tablet TAKE 1 TABLET BY MOUTH AT BEDTIME 30 tablet 3   Multiple Vitamins-Minerals (CENTRUM SILVER PO) Take 1 tablet by mouth daily.     omeprazole (PRILOSEC) 20 MG capsule Take 20 mg by mouth daily.      vitamin E  100 UNIT capsule Take 100 Units by mouth daily.     benzonatate  (TESSALON ) 100 MG capsule Take 1 capsule (100 mg total) by mouth 3 (three) times daily as needed for cough. Do not take with alcohol or while operating or driving heavy machinery 21 capsule 0   fluticasone -salmeterol (ADVAIR) 250-50 MCG/ACT AEPB Inhale 1 puff into the lungs every 12 (twelve) hours. 60 each 11   promethazine -dextromethorphan (PROMETHAZINE -DM) 6.25-15 MG/5ML syrup Take 5 mLs by mouth 4 (four) times daily as needed for cough. 118 mL 0   azithromycin  (ZITHROMAX ) 250 MG tablet Take 2 tablets on day one then take 1 tablet daily for four additional days 6 tablet 0   predniSONE  (DELTASONE )  20 MG tablet Take 2 tablets (40 mg total) by mouth daily with breakfast for 5 days. 10 tablet 0   No facility-administered medications prior to visit.     Review of  Systems: as above    Physical Exam:  BP 134/77   Pulse 80   Temp 98.2 F (36.8 C) (Oral)   Ht 5' 2 (1.575 m) Comment: per pt  Wt 231 lb (104.8 kg)   SpO2 97% Comment: on RA  BMI 42.25 kg/m   GEN: Pleasant, interactive, well-appearing; obese; in no acute distress. HEENT:  Normocephalic and atraumatic. PERRLA. Sclera white. Nasal turbinates erythematous, moist and patent bilaterally. White rhinorrhea present. Oropharynx erythematous and moist, without exudate or edema. No lesions, ulcerations. Mallampati III NECK:  Supple w/ fair ROM. No JVD present. Cervical lymphadenopathy.   CV: RRR, no m/r/g, no peripheral edema. Pulses intact, +2 bilaterally. No cyanosis, pallor or clubbing. PULMONARY:  Unlabored, regular breathing. Scattered wheezes bilaterally A&P. No accessory muscle use.  GI: BS present and normoactive. Soft, non-tender to palpation.  MSK: No erythema, warmth or tenderness. Cap refil <2 sec all extrem.  Neuro: A/Ox3. No focal deficits noted.   Skin: Warm, no lesions or rashe Psych: Normal affect and behavior. Judgement and thought content appropriate.     Lab Results:  CBC    Component Value Date/Time   WBC 6.7 03/20/2023 0732   RBC 4.23 03/20/2023 0732   HGB 12.7 03/20/2023 0732   HCT 39.1 03/20/2023 0732   PLT 243 03/20/2023 0732   MCV 92.4 03/20/2023 0732   MCH 30.0 03/20/2023 0732   MCHC 32.5 03/20/2023 0732   RDW 14.5 03/20/2023 0732   LYMPHSABS 2.9 03/19/2023 1106   MONOABS 0.6 03/19/2023 1106   EOSABS 0.1 03/19/2023 1106   BASOSABS 0.0 03/19/2023 1106    BMET    Component Value Date/Time   NA 135 03/20/2023 0732   K 3.9 03/20/2023 0732   CL 102 03/20/2023 0732   CO2 21 (L) 03/20/2023 0732   GLUCOSE 98 03/20/2023 0732   BUN 10 03/20/2023 0732   CREATININE 0.74 03/20/2023 0732    CALCIUM  9.4 03/20/2023 0732   GFRNONAA >60 03/20/2023 0732   GFRAA >60 05/25/2019 0218    BNP No results found for: BNP   Imaging:  DG Chest 2 View Result Date: 09/19/2024 CLINICAL DATA:  Cough and wheezing EXAM: CHEST - 2 VIEW COMPARISON:  06/08/2022 FINDINGS: Mild-to-moderate left hemidiaphragm eventration posteriorly. Midline trachea. Normal heart size and mediastinal contours. No pleural effusion or pneumothorax. Clear lungs. Cervical spine fixation. IMPRESSION: No active cardiopulmonary disease. Electronically Signed   By: Rockey Kilts M.D.   On: 09/19/2024 14:58    albuterol  (PROVENTIL ) (2.5 MG/3ML) 0.083% nebulizer solution 2.5 mg     Date Action Dose Route User   09/24/2024 1117 Given 2.5 mg Nebulization Winona Sonny HERO, CMA      methylPREDNISolone  acetate (DEPO-MEDROL ) injection 120 mg     Date Action Dose Route User   09/24/2024 1119 Given 120 mg Intramuscular (Right Upper Outer Quadrant) Winona Sonny HERO, CMA          Latest Ref Rng & Units 07/20/2022   10:51 AM  PFT Results  FVC-Pre L 1.43   FVC-Predicted Pre % 51   FVC-Post L 1.33   FVC-Predicted Post % 47   Pre FEV1/FVC % % 87   Post FEV1/FCV % % 89   FEV1-Pre L 1.24   FEV1-Predicted Pre % 58   FEV1-Post L 1.18   DLCO uncorrected ml/min/mmHg 20.05   DLCO UNC% % 109   DLCO corrected ml/min/mmHg 20.51   DLCO COR %Predicted % 112   DLVA Predicted % 146   TLC L  3.82   TLC % Predicted % 80   RV % Predicted % 100     No results found for: NITRICOXIDE      Assessment & Plan:   Moderate persistent asthmatic bronchitis with exacerbation Moderate asthmatic bronchitis with bronchospasm on exam. Likely secondary to viral illness given constellation of symptoms. Recent CXR without superimposed illness. Reviewed typical timeline for viral symptoms. She was unable to complete FeNO today. Will treat her with extended prednisone  taper and depo 120 mg inj x 1. Albuterol  neb in office with improved  airflow. Hold on antimicrobial therapy at this time given no evidence of superimposed infection and likelihood of viral illness. Cough control and supportive care measures. Step up ICS to high dose Advair and optimize bronchodilator regimen with addition of nebulizer treatments during acute illness. Action plan in place. Strict return precautions reviewed. Close follow up  Patient Instructions  Use CPAP every night, minimum of 4-6 hours a night but goal is to wear the entire night  Change equipment as directed. Wash your tubing with warm soap and water daily, hang to dry. Wash humidifier portion weekly. Change water daily with bottled distilled water  Be aware of reduced alertness and do not drive or operate heavy machinery if experiencing this or drowsiness.  Healthy weight management discussed.  Notify if persistent daytime sleepiness occurs even with consistent use of CPAP.   Continue Albuterol  inhaler 2 puffs or 3 mL nebulizer treatment every 6 hours as needed for shortness of breath or wheezing. Notify if symptoms persist despite rescue inhaler/neb use. Use nebulizer treatment 2-3 times a day until symptoms resolve  Continue flonase  nasal spray 2 sprays each nostril daily Continue advair 1 puff Twice daily. I increased the dosing of  your inhaled steroid so you will start a new Advair inhaler. Brush tongue and rinse mouth afterwards Continue montelukast  (singulair ) 1 tab At bedtime    -Prednisone  taper. 4 tabs for 3 days, then 3 tabs for 3 days, 2 tabs for 3 days, then 1 tab for 3 days, then stop. Take in AM with food. Start tomorrow.  Steroid shot today -Promethazine  DM cough syrup 5 mL every 6 hours as needed for cough. Do not drive after taking. May cause drowsiness.  -Benzonatate  1 capsule Three times a day for cough. Use consistently over the next few days  -Guaifenesin [phone] mg Twice daily for cough/congestion -Ipratropium nasal spray 2 sprays each nostril Three times a day for nasal  congestion/drainage -Use saline nasal rinse such as netti pot or neil med rinse (use bottled, distilled water) 1-2 times a day until symptoms resolve. Wait 20-30 minutes then do your nasal sprays    Follow up in 2-3  weeks with Dr. Neda. If symptoms do not improve or worsen, please contact office for sooner follow up or seek emergency care.    URI (upper respiratory infection) See above.   Moderate obstructive sleep apnea Good compliance and control. Receives benefit from use. Aware of risks of untreated OSA. Safe driving practices reviewed.       I spent 35 minutes of dedicated to the care of this patient on the date of this encounter to include pre-visit review of records, face-to-face time with the patient discussing conditions above, post visit ordering of testing, clinical documentation with the electronic health record, making appropriate referrals as documented, and communicating necessary findings to members of the patients care team.  Comer LULLA Rouleau, NP 09/24/2024  Pt aware and understands NP's role.

## 2024-09-24 NOTE — Patient Instructions (Addendum)
 Use CPAP every night, minimum of 4-6 hours a night but goal is to wear the entire night  Change equipment as directed. Wash your tubing with warm soap and water daily, hang to dry. Wash humidifier portion weekly. Change water daily with bottled distilled water  Be aware of reduced alertness and do not drive or operate heavy machinery if experiencing this or drowsiness.  Healthy weight management discussed.  Notify if persistent daytime sleepiness occurs even with consistent use of CPAP.   Continue Albuterol  inhaler 2 puffs or 3 mL nebulizer treatment every 6 hours as needed for shortness of breath or wheezing. Notify if symptoms persist despite rescue inhaler/neb use. Use nebulizer treatment 2-3 times a day until symptoms resolve  Continue flonase  nasal spray 2 sprays each nostril daily Continue advair 1 puff Twice daily. I increased the dosing of  your inhaled steroid so you will start a new Advair inhaler. Brush tongue and rinse mouth afterwards Continue montelukast  (singulair ) 1 tab At bedtime    -Prednisone  taper. 4 tabs for 3 days, then 3 tabs for 3 days, 2 tabs for 3 days, then 1 tab for 3 days, then stop. Take in AM with food. Start tomorrow.  Steroid shot today -Promethazine  DM cough syrup 5 mL every 6 hours as needed for cough. Do not drive after taking. May cause drowsiness.  -Benzonatate  1 capsule Three times a day for cough. Use consistently over the next few days  -Guaifenesin [phone] mg Twice daily for cough/congestion -Ipratropium nasal spray 2 sprays each nostril Three times a day for nasal congestion/drainage -Use saline nasal rinse such as netti pot or neil med rinse (use bottled, distilled water) 1-2 times a day until symptoms resolve. Wait 20-30 minutes then do your nasal sprays    Follow up in 2-3  weeks with Dr. Neda. If symptoms do not improve or worsen, please contact office for sooner follow up or seek emergency care.

## 2024-09-24 NOTE — Assessment & Plan Note (Signed)
 See above

## 2024-09-24 NOTE — Assessment & Plan Note (Signed)
 Good compliance and control. Receives benefit from use. Aware of risks of untreated OSA. Safe driving practices reviewed.

## 2024-11-07 ENCOUNTER — Telehealth: Payer: Self-pay

## 2024-11-07 NOTE — Telephone Encounter (Signed)
 Received faxed CMN from [facility] . Placed into Tammy's sign folder. Once signed, will fax back at [phone]

## 2024-11-12 ENCOUNTER — Encounter: Admitting: Pulmonary Disease

## 2024-11-18 ENCOUNTER — Encounter: Admitting: Pulmonary Disease
